# Patient Record
Sex: Female | Born: 1955 | Race: White | Smoking: Former smoker
Health system: Southern US, Community
[De-identification: ages and names within clinical notes are randomized; demographics above are authoritative.]

## PROBLEM LIST (undated history)

## (undated) DIAGNOSIS — F419 Anxiety disorder, unspecified: Secondary | ICD-10-CM

## (undated) DIAGNOSIS — G894 Chronic pain syndrome: Secondary | ICD-10-CM

## (undated) DIAGNOSIS — I10 Essential (primary) hypertension: Secondary | ICD-10-CM

## (undated) DIAGNOSIS — M719 Bursopathy, unspecified: Secondary | ICD-10-CM

## (undated) DIAGNOSIS — F410 Panic disorder [episodic paroxysmal anxiety] without agoraphobia: Secondary | ICD-10-CM

## (undated) DIAGNOSIS — F329 Major depressive disorder, single episode, unspecified: Secondary | ICD-10-CM

## (undated) DIAGNOSIS — K219 Gastro-esophageal reflux disease without esophagitis: Secondary | ICD-10-CM

## (undated) DIAGNOSIS — F32A Depression, unspecified: Secondary | ICD-10-CM

## (undated) HISTORY — PX: LAPAROSCOPIC HYSTERECTOMY: SHX1926

## (undated) HISTORY — PX: TOE SURGERY: SHX1073

## (undated) HISTORY — PX: TONSILLECTOMY: SUR1361

## (undated) HISTORY — DX: Essential (primary) hypertension: I10

---

## 2007-07-25 ENCOUNTER — Ambulatory Visit: Payer: Self-pay | Admitting: Podiatry

## 2007-08-01 ENCOUNTER — Ambulatory Visit: Payer: Self-pay | Admitting: Podiatry

## 2010-05-06 ENCOUNTER — Ambulatory Visit: Payer: Self-pay | Admitting: Rheumatology

## 2014-02-21 ENCOUNTER — Ambulatory Visit: Payer: Self-pay | Admitting: Family Medicine

## 2014-02-21 LAB — RAPID STREP-A WITH REFLX: MICRO TEXT REPORT: NEGATIVE

## 2014-02-23 LAB — BETA STREP CULTURE(ARMC)

## 2014-11-10 ENCOUNTER — Encounter: Payer: Self-pay | Admitting: Family Medicine

## 2014-11-10 ENCOUNTER — Ambulatory Visit (INDEPENDENT_AMBULATORY_CARE_PROVIDER_SITE_OTHER): Payer: BLUE CROSS/BLUE SHIELD | Admitting: Family Medicine

## 2014-11-10 VITALS — BP 130/96 | HR 68 | Ht 68.0 in | Wt 175.0 lb

## 2014-11-10 DIAGNOSIS — H6503 Acute serous otitis media, bilateral: Secondary | ICD-10-CM

## 2014-11-10 DIAGNOSIS — H6983 Other specified disorders of Eustachian tube, bilateral: Secondary | ICD-10-CM | POA: Diagnosis not present

## 2014-11-10 DIAGNOSIS — J302 Other seasonal allergic rhinitis: Secondary | ICD-10-CM | POA: Diagnosis not present

## 2014-11-10 NOTE — Progress Notes (Signed)
Name: Kaitlyn Ali   MRN: [id number]    DOB: 1955/05/25   Date:11/10/2014       Progress Note  Subjective  Chief Complaint  Chief Complaint  Patient presents with  . Ear Pain     pressure in the R) ear    Otalgia  There is pain in both ears. This is a recurrent problem. The current episode started in the past 7 days. The problem occurs constantly. The problem has been waxing and waning. There has been no fever. The pain is at a severity of 4/10. The pain is mild. Pertinent negatives include no abdominal pain, coughing, diarrhea, ear discharge, headaches, hearing loss, neck pain, rash, rhinorrhea, sore throat or vomiting. She has tried acetaminophen for the symptoms. The treatment provided mild relief. There is no history of hearing loss.    No problem-specific assessment & plan notes found for this encounter.   No past medical history on file.  Past Surgical History  Procedure Laterality Date  . Vaginal hysterectomy    . Toe surgery      shaved bone in R) great toe  . Tonsillectomy      No family history on file.  History   Social History  . Marital Status: Married    Spouse Name: N/A  . Number of Children: N/A  . Years of Education: N/A   Occupational History  . Not on file.   Social History Main Topics  . Smoking status: Former Research scientist (life sciences)  . Smokeless tobacco: Not on file  . Alcohol Use: 0.0 oz/week    0 Standard drinks or equivalent per week  . Drug Use: No  . Sexual Activity: Not on file   Other Topics Concern  . Not on file   Social History Narrative  . No narrative on file    Allergies  Allergen Reactions  . Nsaids     Bothers stomach     Review of Systems  Constitutional: Negative for fever, chills, weight loss and malaise/fatigue.  HENT: Positive for ear pain. Negative for ear discharge, hearing loss, rhinorrhea and sore throat.   Eyes: Negative for blurred vision.  Respiratory: Negative for cough, sputum production, shortness of breath and  wheezing.   Cardiovascular: Negative for chest pain, palpitations and leg swelling.  Gastrointestinal: Negative for heartburn, nausea, vomiting, abdominal pain, diarrhea, constipation, blood in stool and melena.  Genitourinary: Negative for dysuria, urgency, frequency and hematuria.  Musculoskeletal: Negative for myalgias, back pain, joint pain and neck pain.  Skin: Negative for rash.  Neurological: Negative for dizziness, tingling, sensory change, focal weakness and headaches.  Endo/Heme/Allergies: Negative for environmental allergies and polydipsia. Does not bruise/bleed easily.  Psychiatric/Behavioral: Negative for depression and suicidal ideas. The patient is not nervous/anxious and does not have insomnia.      Objective  Filed Vitals:   11/10/14 1535  BP: 130/96  Pulse: 68  Height: 5\' 8"  (1.727 m)  Weight: 175 lb (79.379 kg)    Physical Exam  Constitutional: She is well-developed, well-nourished, and in no distress. No distress.  HENT:  Head: Normocephalic and atraumatic.  Right Ear: External ear normal.  Left Ear: External ear normal.  Nose: Nose normal.  Mouth/Throat: Oropharynx is clear and moist.  Eyes: Conjunctivae and EOM are normal. Pupils are equal, round, and reactive to light. Right eye exhibits no discharge. Left eye exhibits no discharge.  Neck: Normal range of motion. Neck supple. No JVD present. No thyromegaly present.  Cardiovascular: Normal rate, regular rhythm, normal heart  sounds and intact distal pulses.  Exam reveals no gallop and no friction rub.   No murmur heard. Pulmonary/Chest: Effort normal and breath sounds normal.  Abdominal: Soft. Bowel sounds are normal. She exhibits no mass. There is no tenderness. There is no guarding.  Musculoskeletal: Normal range of motion. She exhibits no edema.  Lymphadenopathy:    She has no cervical adenopathy.  Neurological: She is alert. She has normal reflexes.  Skin: Skin is warm and dry. No rash noted. She is  not diaphoretic. No erythema. No pallor.  Psychiatric: Mood and affect normal.  Nursing note and vitals reviewed.     Assessment & Plan  Problem List Items Addressed This Visit    None    Visit Diagnoses    Eustachian tube dysfunction, bilateral    -  Primary    flonase/sudafed benedryl q hs    Bilateral acute serous otitis media, recurrence not specified             Dr. [facility]  11/10/2014

## 2014-11-11 MED ORDER — FLUTICASONE PROPIONATE 50 MCG/ACT NA SUSP
2.0000 | Freq: Every day | NASAL | Status: DC
Start: 2014-11-11 — End: 2015-05-14

## 2014-11-11 NOTE — Addendum Note (Signed)
Addended by: Fredderick Severance on: 11/11/2014 02:11 PM   Modules accepted: Orders

## 2014-12-14 ENCOUNTER — Ambulatory Visit
Admission: EM | Admit: 2014-12-14 | Discharge: 2014-12-14 | Disposition: A | Discharge status: Home / Self Care | Payer: BLUE CROSS/BLUE SHIELD | Attending: Family Medicine | Admitting: Family Medicine

## 2014-12-14 ENCOUNTER — Encounter: Payer: Self-pay | Admitting: Emergency Medicine

## 2014-12-14 DIAGNOSIS — L02419 Cutaneous abscess of limb, unspecified: Secondary | ICD-10-CM

## 2014-12-14 DIAGNOSIS — L03317 Cellulitis of buttock: Secondary | ICD-10-CM | POA: Diagnosis not present

## 2014-12-14 DIAGNOSIS — L03119 Cellulitis of unspecified part of limb: Secondary | ICD-10-CM

## 2014-12-14 HISTORY — DX: Depression, unspecified: F32.A

## 2014-12-14 HISTORY — DX: Major depressive disorder, single episode, unspecified: F32.9

## 2014-12-14 HISTORY — DX: Panic disorder (episodic paroxysmal anxiety): F41.0

## 2014-12-14 HISTORY — DX: Anxiety disorder, unspecified: F41.9

## 2014-12-14 HISTORY — DX: Bursopathy, unspecified: M71.9

## 2014-12-14 MED ORDER — MUPIROCIN 2 % EX OINT: 1.0000 "application " | TOPICAL_OINTMENT | Freq: Two times a day (BID) | CUTANEOUS | Status: DC

## 2014-12-14 MED ORDER — SULFAMETHOXAZOLE-TRIMETHOPRIM 800-160 MG PO TABS: 1.0000 | ORAL_TABLET | Freq: Two times a day (BID) | ORAL | Status: AC

## 2014-12-14 MED ORDER — CEFTRIAXONE SODIUM 1 G IJ SOLR
1.0000 g | Freq: Once | INTRAMUSCULAR | Status: AC
  Administered 2014-12-14: 1 g via INTRAMUSCULAR

## 2014-12-14 NOTE — ED Notes (Signed)
Patient presents here with c/o abscess to the lt buttocks asscoited with fever, states that it started as a small pustule and got worse, redness and swelling noted over the site

## 2014-12-14 NOTE — ED Provider Notes (Signed)
CSN: [id number]     Arrival date & time 12/14/14  1351 History   First MD Initiated Contact with Patient 12/14/14 1459     Chief Complaint  Patient presents with  . 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  Denied drainage.  Has been applying hot backs.  Has not had anything like this before.  Denied history of MRSA.  Voiding and stooling without difficulty.  Denied nausea/vomiting/diarrhea.  The history is provided by the patient.    Past Medical History  Diagnosis Date  . Anxiety   . Depression   . Panic attack   . Bursitis    Past Surgical History  Procedure Laterality Date  . Vaginal hysterectomy    . Toe surgery      shaved bone in R) great toe  . Tonsillectomy     No family history on file. Social History  Substance Use Topics  . Smoking status: Former Research scientist (life sciences)  . Smokeless tobacco: None  . Alcohol Use: 0.0 oz/week    0 Standard drinks or equivalent per week   OB History    No data available     Review of Systems  Constitutional: Positive for fever. Negative for chills, diaphoresis, activity change, appetite change and fatigue.  HENT: Negative for congestion, dental problem, drooling, ear discharge, ear pain, facial swelling, hearing loss, mouth sores, nosebleeds, postnasal drip, rhinorrhea, sinus pressure, sneezing, sore throat, tinnitus, trouble swallowing and voice change.   Eyes: Negative for photophobia, pain, discharge, redness, itching and visual disturbance.  Respiratory: Negative for cough, choking, chest tightness, shortness of breath, wheezing and stridor.   Cardiovascular: Negative for chest pain, palpitations and leg swelling.  Gastrointestinal:  Negative for nausea, vomiting, abdominal pain, diarrhea, constipation, blood in stool and abdominal distention.  Endocrine: Negative for cold intolerance and heat intolerance.  Genitourinary: Negative for dysuria.  Musculoskeletal: Positive for myalgias. Negative for back pain, joint swelling, arthralgias, gait problem, neck pain and neck stiffness.  Skin: Positive for color change and rash. Negative for pallor and wound.  Allergic/Immunologic: Negative for environmental allergies and food allergies.  Neurological: Negative for dizziness, tremors, seizures, syncope, facial asymmetry, speech difficulty, weakness, light-headedness, numbness and headaches.  Hematological: Negative for adenopathy. Does not bruise/bleed easily.  Psychiatric/Behavioral: Positive for sleep disturbance. Negative for behavioral problems, confusion and agitation. The patient is nervous/anxious.     Allergies  Nsaids  Home Medications   Prior to Admission medications   Medication Sig Start Date End Date Taking? Authorizing Provider  ALPRAZolam (XANAX XR) 1 MG 24 hr tablet Take 1 mg by mouth 2 (two) times daily. Dr Bridgett Larsson 10/17/14  Yes Historical Provider, MD  buPROPion (WELLBUTRIN XL) 300 MG 24 hr tablet Take 300 mg by mouth every morning. Dr Bridgett Larsson 09/17/14  Yes Historical Provider, MD  fluticasone (FLONASE) 50 MCG/ACT nasal spray Place 2 sprays into both nostrils daily. 11/11/14  Yes Juline Patch, MD  oxyCODONE-acetaminophen (PERCOCET) 10-325 MG per tablet Take 1 tablet by mouth every 8 (eight) hours as needed. Dr Carrolyn Meiers 10/17/14  Yes Historical Provider, MD  mupirocin ointment (BACTROBAN) 2 % Apply 1 application topically 2 (two) times daily. 12/14/14   Olen Cordial, NP  sulfamethoxazole-trimethoprim (BACTRIM DS,SEPTRA DS) 800-160 MG per tablet Take 1 tablet by mouth 2 (two)  times daily. 12/14/14 12/21/14  Olen Cordial, NP   Meds Ordered and Administered this Visit   Medications  cefTRIAXone (ROCEPHIN) injection 1  g (1 g Intramuscular Given 12/14/14 1534)    BP 117/69 mmHg  Pulse 82  Temp(Src) 98.5 F (36.9 C) (Tympanic)  Resp 20  Ht 5\' 8"  (1.727 m)  Wt 186 lb (84.369 kg)  BMI 28.29 kg/m2  SpO2 98% No data found.   Physical Exam  Constitutional: She is oriented to person, place, and time. Vital signs are normal. She appears well-developed and well-nourished. No distress.  HENT:  Head: Normocephalic and atraumatic.  Right Ear: External ear normal.  Left Ear: External ear normal.  Nose: Nose normal.  Mouth/Throat: Oropharynx is clear and moist. No oropharyngeal exudate.  Eyes: Conjunctivae, EOM and lids are normal. Pupils are equal, round, and reactive to light. Right eye exhibits no discharge. Left eye exhibits no discharge. No scleral icterus.  Neck: Trachea normal and normal range of motion. Neck supple. No tracheal deviation present.  Cardiovascular: Normal rate, regular rhythm, normal heart sounds and intact distal pulses.  Exam reveals no gallop and no friction rub.   No murmur heard. Pulmonary/Chest: Effort normal and breath sounds normal. No stridor. No respiratory distress. She has no wheezes. She has no rales. She exhibits no tenderness.  Abdominal: Soft. Bowel sounds are normal. She exhibits no distension and no mass. There is no tenderness. There is no rebound and no guarding.  Musculoskeletal: Normal range of motion. She exhibits edema and tenderness.       Right hip: Normal.       Left hip: Normal.       Right knee: Normal.       Left knee: Normal.       Lumbar back: Normal.       Right upper leg: Normal.       Left upper leg: She exhibits tenderness, swelling and edema. She exhibits no bony tenderness, no deformity and no laceration.       Right lower leg: Normal.       Left lower leg: Normal.  Lymphadenopathy:    She has no cervical adenopathy.  Neurological: She is alert and oriented to person, place, and time. She exhibits normal muscle tone. Coordination normal.  Skin:  Skin is warm, dry and intact. Rash noted. No abrasion, no bruising, no burn, no ecchymosis, no laceration, no lesion and no petechiae noted. Rash is maculopapular. She is not diaphoretic. There is erythema. No cyanosis. No pallor. Nails show no clubbing.     Psychiatric: She has a normal mood and affect. Her speech is normal and behavior is normal. Judgment and thought content normal. Cognition and memory are normal.  Nursing note and vitals reviewed.   ED Course  Procedures (including critical care time)  Labs Review Labs Reviewed - No data to display  Imaging Review No results found.  Patient lying on exam table for exam with left leg elevated uncomfortable to sit in chair.   Medications  cefTRIAXone (ROCEPHIN) injection 1 g (1 g Intramuscular Given 12/14/14 1534)  given by RN Tressie Stalker; tolerated well VSS s/p injection.  Patient has oxycodone at home for pain and discussed tylenol 500mg  po QID if only taking 1 oxycodone q8h. Try to keep pressure off area.  Hot/cold packs to try and get infection to head and for pain control.  Patient verbalized understanding of information/instructions, agreed with plan of care and had no further questions at this  time.  Filed Vitals:   12/14/14 1602  BP: 117/69  Pulse:   Temp:   Resp:     MDM   1. Cellulitis of buttock   2. Cellulitis and abscess of lower extremity    Rocephin IM in clinic and patient to start bactrim DS po BID as soon as she can start from pharmacy.  Warm compresses and ice.  Follow up with PCM for recheck in 48 hours.  Sooner if worsening in erythema as may need IV antibiotics.  Dr Alveta Heimlich evaluated patient also no fluctuant area to be able to I&D at this time.  Apply bactroban to papule area on left buttock.  Do not scratch or pick skin.  Work excuse x 48 hours.  Will treat for cellulitis and abscess.  Exitcare handout on skin infection and abscess given to patient.  RTC if worsening erythema, pain, purulent discharge, fever.   ER if redness extends above waistline or below thigh level, unable to void every 8 hours.    Wash towels, washcloths, sheets in hot water with bleach every couple of days until infection resolved.  Patient verbalized understanding, agreed with plan of care and had no further questions at this time.      Olen Cordial, NP 12/15/14 2140

## 2014-12-14 NOTE — Discharge Instructions (Signed)

## 2015-01-07 ENCOUNTER — Ambulatory Visit
Admission: EM | Admit: 2015-01-07 | Discharge: 2015-01-07 | Disposition: A | Discharge status: Home / Self Care | Payer: BLUE CROSS/BLUE SHIELD | Attending: Family Medicine | Admitting: Family Medicine

## 2015-01-07 DIAGNOSIS — Z139 Encounter for screening, unspecified: Secondary | ICD-10-CM | POA: Diagnosis not present

## 2015-01-07 DIAGNOSIS — I499 Cardiac arrhythmia, unspecified: Secondary | ICD-10-CM | POA: Insufficient documentation
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
## 2015-01-07 NOTE — ED Notes (Signed)
Pt state "I was told my my pain management doctor that I needed an EKG as the test I had done in the office showed I am at risk for sudden death. I am confused because I feel fine. Dr. Ronnald Ramp is unable to see me today."

## 2015-05-12 ENCOUNTER — Other Ambulatory Visit: Payer: Self-pay

## 2015-05-14 ENCOUNTER — Encounter: Payer: Self-pay | Admitting: Family Medicine

## 2015-05-14 ENCOUNTER — Ambulatory Visit (INDEPENDENT_AMBULATORY_CARE_PROVIDER_SITE_OTHER): Payer: BLUE CROSS/BLUE SHIELD | Admitting: Family Medicine

## 2015-05-14 VITALS — BP 138/80 | HR 80 | Ht 68.0 in | Wt 193.0 lb

## 2015-05-14 DIAGNOSIS — F329 Major depressive disorder, single episode, unspecified: Secondary | ICD-10-CM | POA: Diagnosis not present

## 2015-05-14 DIAGNOSIS — J302 Other seasonal allergic rhinitis: Secondary | ICD-10-CM

## 2015-05-14 DIAGNOSIS — J301 Allergic rhinitis due to pollen: Secondary | ICD-10-CM | POA: Diagnosis not present

## 2015-05-14 DIAGNOSIS — F419 Anxiety disorder, unspecified: Secondary | ICD-10-CM

## 2015-05-14 DIAGNOSIS — F32A Depression, unspecified: Secondary | ICD-10-CM

## 2015-05-14 MED ORDER — FLUTICASONE PROPIONATE 50 MCG/ACT NA SUSP: 2.0000 | Freq: Every day | NASAL | Status: DC

## 2015-05-14 MED ORDER — ALPRAZOLAM ER 1 MG PO TB24: 1.0000 mg | ORAL_TABLET | Freq: Two times a day (BID) | ORAL | Status: DC

## 2015-05-14 MED ORDER — BUPROPION HCL ER (XL) 300 MG PO TB24: 300.0000 mg | ORAL_TABLET | Freq: Every morning | ORAL | Status: DC

## 2015-05-14 NOTE — Progress Notes (Signed)
Name: Kaitlyn Ali   MRN: [id number]    DOB: March 31, 1956   Date:05/14/2015       Progress Note  Subjective  Chief Complaint  Chief Complaint  Patient presents with  . Anxiety    needs refill on Xanax until getting in with Psych    Anxiety Presents for follow-up visit. The problem has been waxing and waning. Symptoms include excessive worry and nervous/anxious behavior. Patient reports no chest pain, compulsions, confusion, decreased concentration, depressed mood, dizziness, dry mouth, feeling of choking, hyperventilation, impotence, insomnia, irritability, malaise, muscle tension, nausea, obsessions, palpitations, panic, restlessness, shortness of breath or suicidal ideas. Symptoms occur most days. The severity of symptoms is mild. The symptoms are aggravated by work stress. The quality of sleep is good.   Her past medical history is significant for anxiety/panic attacks. There is no history of anemia, arrhythmia, asthma, bipolar disorder, CAD, CHF, chronic lung disease, depression, hyperthyroidism or suicide attempts. Past treatments include benzodiazephines. The treatment provided mild relief. Compliance with prior treatments has been good. Compliance with medications is 76-100%.    No problem-specific assessment & plan notes found for this encounter.   Past Medical History  Diagnosis Date  . Anxiety   . Depression   . Panic attack   . Bursitis     Past Surgical History  Procedure Laterality Date  . Vaginal hysterectomy    . Toe surgery      shaved bone in R) great toe  . Tonsillectomy      Family History  Problem Relation Age of Onset  . Diabetes Mother   . Cancer Father   . Cancer Sister     Social History   Social History  . Marital Status: Married    Spouse Name: N/A  . Number of Children: N/A  . Years of Education: N/A   Occupational History  . Not on file.   Social History Main Topics  . Smoking status: Former Research scientist (life sciences)  . Smokeless tobacco: Not on  file  . Alcohol Use: 0.0 oz/week    0 Standard drinks or equivalent per week  . Drug Use: No  . Sexual Activity: Yes   Other Topics Concern  . Not on file   Social History Narrative    Allergies  Allergen Reactions  . Nsaids     Bothers stomach     Review of Systems  Constitutional: Negative for fever, chills, weight loss, malaise/fatigue and irritability.  HENT: Negative for ear discharge, ear pain and sore throat.   Eyes: Negative for blurred vision.  Respiratory: Negative for cough, sputum production, shortness of breath and wheezing.   Cardiovascular: Negative for chest pain, palpitations and leg swelling.  Gastrointestinal: Negative for heartburn, nausea, abdominal pain, diarrhea, constipation, blood in stool and melena.  Genitourinary: Negative for dysuria, urgency, frequency, hematuria and impotence.  Musculoskeletal: Negative for myalgias, back pain, joint pain and neck pain.  Skin: Negative for rash.  Neurological: Negative for dizziness, tingling, sensory change, focal weakness and headaches.  Endo/Heme/Allergies: Negative for environmental allergies and polydipsia. Does not bruise/bleed easily.  Psychiatric/Behavioral: Negative for depression, suicidal ideas, confusion and decreased concentration. The patient is nervous/anxious. The patient does not have insomnia.      Objective  Filed Vitals:   05/14/15 0845  BP: 138/80  Pulse: 80  Height: 5\' 8"  (1.727 m)  Weight: 193 lb (87.544 kg)    Physical Exam  Constitutional: She is well-developed, well-nourished, and in no distress. No distress.  HENT:  Head: Normocephalic  and atraumatic.  Right Ear: External ear normal.  Left Ear: External ear normal.  Nose: Nose normal.  Mouth/Throat: Oropharynx is clear and moist.  Eyes: Conjunctivae and EOM are normal. Pupils are equal, round, and reactive to light. Right eye exhibits no discharge. Left eye exhibits no discharge.  Neck: Normal range of motion. Neck supple.  No JVD present. No thyromegaly present.  Cardiovascular: Normal rate, regular rhythm, normal heart sounds and intact distal pulses.  Exam reveals no gallop and no friction rub.   No murmur heard. Pulmonary/Chest: Effort normal and breath sounds normal.  Abdominal: Soft. Bowel sounds are normal. She exhibits no mass. There is no tenderness. There is no guarding.  Musculoskeletal: Normal range of motion. She exhibits no edema.  Lymphadenopathy:    She has no cervical adenopathy.  Neurological: She is alert.  Skin: Skin is warm and dry. She is not diaphoretic.  Psychiatric: Mood and affect normal.      Assessment & Plan  Problem List Items Addressed This Visit    None    Visit Diagnoses    Chronic anxiety    -  Primary    Relevant Medications    ALPRAZolam (XANAX XR) 1 MG 24 hr tablet    buPROPion (WELLBUTRIN XL) 300 MG 24 hr tablet    Depression        pt making psych appt    Relevant Medications    ALPRAZolam (XANAX XR) 1 MG 24 hr tablet    buPROPion (WELLBUTRIN XL) 300 MG 24 hr tablet    Allergic rhinitis due to pollen        Relevant Medications    fluticasone (FLONASE) 50 MCG/ACT nasal spray    Other seasonal allergic rhinitis        Relevant Medications    fluticasone (FLONASE) 50 MCG/ACT nasal spray         Dr. Deanna [facility]  05/14/2015

## 2015-06-15 ENCOUNTER — Ambulatory Visit (INDEPENDENT_AMBULATORY_CARE_PROVIDER_SITE_OTHER): Payer: BLUE CROSS/BLUE SHIELD | Admitting: Family Medicine

## 2015-06-15 ENCOUNTER — Encounter: Payer: Self-pay | Admitting: Family Medicine

## 2015-06-15 VITALS — BP 120/98 | HR 80 | Temp 98.0°F | Ht 68.0 in | Wt 185.0 lb

## 2015-06-15 DIAGNOSIS — J0100 Acute maxillary sinusitis, unspecified: Secondary | ICD-10-CM

## 2015-06-15 MED ORDER — AMOXICILLIN 500 MG PO CAPS: 500.0000 mg | ORAL_CAPSULE | Freq: Three times a day (TID) | ORAL | Status: DC

## 2015-06-15 NOTE — Progress Notes (Signed)
Name: Kaitlyn Ali   MRN: [id number]    DOB: 06-Dec-1955   Date:06/15/2015       Progress Note  Subjective  Chief Complaint  Chief Complaint  Patient presents with  . Sore Throat    runny nose, teeth pain, headache, fever/ sweating    Sore Throat  This is a new problem. The current episode started in the past 7 days. The problem has been waxing and waning. Maximum temperature: low grade fever. The pain is at a severity of 3/10. The pain is moderate. Associated symptoms include congestion, coughing, diarrhea and headaches. Pertinent negatives include no abdominal pain, drooling, ear discharge, ear pain, hoarse voice, plugged ear sensation, neck pain, shortness of breath, swollen glands or vomiting. Associated symptoms comments: nausea. Kaitlyn Ali has tried acetaminophen and NSAIDs for the symptoms. The treatment provided mild relief.    No problem-specific assessment & plan notes found for this encounter.   Past Medical History  Diagnosis Date  . Anxiety   . Depression   . Panic attack   . Bursitis     Past Surgical History  Procedure Laterality Date  . Vaginal hysterectomy    . Toe surgery      shaved bone in R) great toe  . Tonsillectomy      Family History  Problem Relation Age of Onset  . Diabetes Mother   . Cancer Father   . Cancer Sister     Social History   Social History  . Marital Status: Married    Spouse Name: N/A  . Number of Children: N/A  . Years of Education: N/A   Occupational History  . Not on file.   Social History Main Topics  . Smoking status: Former Research scientist (life sciences)  . Smokeless tobacco: Not on file  . Alcohol Use: 0.0 oz/week    0 Standard drinks or equivalent per week  . Drug Use: No  . Sexual Activity: Yes   Other Topics Concern  . Not on file   Social History Narrative    Allergies  Allergen Reactions  . Nsaids     Bothers stomach     Review of Systems  Constitutional: Negative for fever, chills, weight loss and malaise/fatigue.   HENT: Positive for congestion. Negative for drooling, ear discharge, ear pain, hoarse voice and sore throat.   Eyes: Negative for blurred vision.  Respiratory: Positive for cough. Negative for sputum production, shortness of breath and wheezing.   Cardiovascular: Negative for chest pain, palpitations and leg swelling.  Gastrointestinal: Positive for diarrhea. Negative for heartburn, nausea, vomiting, abdominal pain, constipation, blood in stool and melena.  Genitourinary: Negative for dysuria, urgency, frequency and hematuria.  Musculoskeletal: Negative for myalgias, back pain, joint pain and neck pain.  Skin: Negative for rash.  Neurological: Positive for headaches. Negative for dizziness, tingling, sensory change and focal weakness.  Endo/Heme/Allergies: Negative for environmental allergies and polydipsia. Does not bruise/bleed easily.  Psychiatric/Behavioral: Negative for depression and suicidal ideas. The patient is not nervous/anxious and does not have insomnia.      Objective  Filed Vitals:   06/15/15 1503  BP: 120/98  Pulse: 80  Temp: 98 F (36.7 C)  TempSrc: Oral  Height: 5\' 8"  (1.727 m)  Weight: 185 lb (83.915 kg)    Physical Exam  Constitutional: Kaitlyn Ali is well-developed, well-nourished, and in no distress. No distress.  HENT:  Head: Normocephalic and atraumatic.  Right Ear: External ear normal.  Left Ear: External ear normal.  Nose: Nose normal.  Mouth/Throat: Oropharynx  is clear and moist.  Eyes: Conjunctivae and EOM are normal. Pupils are equal, round, and reactive to light. Right eye exhibits no discharge. Left eye exhibits no discharge.  Neck: Normal range of motion. Neck supple. No JVD present. No thyromegaly present.  Cardiovascular: Normal rate, regular rhythm, normal heart sounds and intact distal pulses.  Exam reveals no gallop and no friction rub.   No murmur heard. Pulmonary/Chest: Effort normal and breath sounds normal.  Abdominal: Soft. Bowel sounds are  normal. Kaitlyn Ali exhibits no mass. There is no tenderness. There is no guarding.  Musculoskeletal: Normal range of motion. Kaitlyn Ali exhibits no edema.  Lymphadenopathy:    Kaitlyn Ali has no cervical adenopathy.  Neurological: Kaitlyn Ali is alert. Kaitlyn Ali has normal reflexes.  Skin: Skin is warm and dry. Kaitlyn Ali is not diaphoretic.  Psychiatric: Mood and affect normal.  Nursing note and vitals reviewed.     Assessment & Plan  Problem List Items Addressed This Visit    None    Visit Diagnoses    Acute maxillary sinusitis, recurrence not specified    -  Primary    Relevant Medications    amoxicillin (AMOXIL) 500 MG capsule         Dr. Cervando Durnin [facility]  06/15/2015

## 2015-07-08 ENCOUNTER — Other Ambulatory Visit: Payer: Self-pay

## 2015-07-09 ENCOUNTER — Other Ambulatory Visit: Payer: Self-pay

## 2015-07-09 DIAGNOSIS — J0100 Acute maxillary sinusitis, unspecified: Secondary | ICD-10-CM

## 2015-07-09 MED ORDER — AMOXICILLIN 500 MG PO CAPS: 500.0000 mg | ORAL_CAPSULE | Freq: Three times a day (TID) | ORAL | Status: DC

## 2015-07-22 DIAGNOSIS — F411 Generalized anxiety disorder: Secondary | ICD-10-CM | POA: Diagnosis not present

## 2015-07-22 DIAGNOSIS — F330 Major depressive disorder, recurrent, mild: Secondary | ICD-10-CM | POA: Diagnosis not present

## 2015-07-30 DIAGNOSIS — M5431 Sciatica, right side: Secondary | ICD-10-CM | POA: Diagnosis not present

## 2015-07-30 DIAGNOSIS — Z79891 Long term (current) use of opiate analgesic: Secondary | ICD-10-CM | POA: Diagnosis not present

## 2015-07-30 DIAGNOSIS — M5441 Lumbago with sciatica, right side: Secondary | ICD-10-CM | POA: Diagnosis not present

## 2015-07-30 DIAGNOSIS — G8929 Other chronic pain: Secondary | ICD-10-CM | POA: Diagnosis not present

## 2015-07-30 DIAGNOSIS — M545 Low back pain: Secondary | ICD-10-CM | POA: Diagnosis not present

## 2015-09-01 DIAGNOSIS — Z79891 Long term (current) use of opiate analgesic: Secondary | ICD-10-CM | POA: Diagnosis not present

## 2015-09-01 DIAGNOSIS — M25551 Pain in right hip: Secondary | ICD-10-CM | POA: Diagnosis not present

## 2015-09-01 DIAGNOSIS — M25511 Pain in right shoulder: Secondary | ICD-10-CM | POA: Diagnosis not present

## 2015-09-01 DIAGNOSIS — M25552 Pain in left hip: Secondary | ICD-10-CM | POA: Diagnosis not present

## 2015-09-01 DIAGNOSIS — G894 Chronic pain syndrome: Secondary | ICD-10-CM | POA: Diagnosis not present

## 2015-10-01 DIAGNOSIS — G8929 Other chronic pain: Secondary | ICD-10-CM | POA: Diagnosis not present

## 2015-10-01 DIAGNOSIS — Z79891 Long term (current) use of opiate analgesic: Secondary | ICD-10-CM | POA: Diagnosis not present

## 2015-10-01 DIAGNOSIS — G894 Chronic pain syndrome: Secondary | ICD-10-CM | POA: Diagnosis not present

## 2015-10-01 DIAGNOSIS — G541 Lumbosacral plexus disorders: Secondary | ICD-10-CM | POA: Diagnosis not present

## 2015-10-01 DIAGNOSIS — G603 Idiopathic progressive neuropathy: Secondary | ICD-10-CM | POA: Diagnosis not present

## 2015-10-20 DIAGNOSIS — F330 Major depressive disorder, recurrent, mild: Secondary | ICD-10-CM | POA: Diagnosis not present

## 2015-10-28 DIAGNOSIS — M25511 Pain in right shoulder: Secondary | ICD-10-CM | POA: Diagnosis not present

## 2015-10-28 DIAGNOSIS — M5416 Radiculopathy, lumbar region: Secondary | ICD-10-CM | POA: Diagnosis not present

## 2015-10-28 DIAGNOSIS — M25552 Pain in left hip: Secondary | ICD-10-CM | POA: Diagnosis not present

## 2015-10-28 DIAGNOSIS — M25551 Pain in right hip: Secondary | ICD-10-CM | POA: Diagnosis not present

## 2015-10-28 DIAGNOSIS — G894 Chronic pain syndrome: Secondary | ICD-10-CM | POA: Diagnosis not present

## 2015-10-28 DIAGNOSIS — M25571 Pain in right ankle and joints of right foot: Secondary | ICD-10-CM | POA: Diagnosis not present

## 2015-10-28 DIAGNOSIS — M545 Low back pain: Secondary | ICD-10-CM | POA: Diagnosis not present

## 2015-11-25 DIAGNOSIS — M25571 Pain in right ankle and joints of right foot: Secondary | ICD-10-CM | POA: Diagnosis not present

## 2015-11-25 DIAGNOSIS — G894 Chronic pain syndrome: Secondary | ICD-10-CM | POA: Diagnosis not present

## 2015-11-25 DIAGNOSIS — M25562 Pain in left knee: Secondary | ICD-10-CM | POA: Diagnosis not present

## 2015-11-25 DIAGNOSIS — M25511 Pain in right shoulder: Secondary | ICD-10-CM | POA: Diagnosis not present

## 2015-11-25 DIAGNOSIS — Z79891 Long term (current) use of opiate analgesic: Secondary | ICD-10-CM | POA: Diagnosis not present

## 2015-11-26 ENCOUNTER — Encounter: Payer: Self-pay | Admitting: Family Medicine

## 2015-11-26 ENCOUNTER — Ambulatory Visit (INDEPENDENT_AMBULATORY_CARE_PROVIDER_SITE_OTHER): Payer: BLUE CROSS/BLUE SHIELD | Admitting: Family Medicine

## 2015-11-26 VITALS — BP 120/80 | HR 98 | Temp 99.2°F | Ht 68.0 in | Wt 182.0 lb

## 2015-11-26 DIAGNOSIS — J0100 Acute maxillary sinusitis, unspecified: Secondary | ICD-10-CM | POA: Diagnosis not present

## 2015-11-26 MED ORDER — DOXYCYCLINE HYCLATE 100 MG PO TABS: 100.0000 mg | ORAL_TABLET | Freq: Two times a day (BID) | ORAL | 0 refills | Status: DC

## 2015-11-26 NOTE — Progress Notes (Signed)
Name: Kaitlyn Ali   MRN: [id number]    DOB: Dec 26, 1955   Date:11/26/2015       Progress Note  Subjective  Chief Complaint  Chief Complaint  Patient presents with  . Sinusitis    fever, cong, x 2 days- had traveled from [place] on [place]- started feeling bad on Tuesday    Sinusitis  This is a new problem. The current episode started in the past 7 days. The problem has been gradually worsening since onset. The maximum temperature recorded prior to her arrival was 100.4 - 100.9 F. The fever has been present for 1 to 2 days. The pain is mild. Pertinent negatives include no chills, congestion, coughing, diaphoresis, ear pain, headaches, hoarse voice, neck pain, shortness of breath, sinus pressure, sneezing, sore throat or swollen glands. Past treatments include nothing.    No problem-specific Assessment & Plan notes found for this encounter.   Past Medical History:  Diagnosis Date  . Anxiety   . Bursitis   . Depression   . Panic attack     Past Surgical History:  Procedure Laterality Date  . TOE SURGERY     shaved bone in R) great toe  . TONSILLECTOMY    . VAGINAL HYSTERECTOMY      Family History  Problem Relation Age of Onset  . Diabetes Mother   . Cancer Father   . Cancer Sister     Social History   Social History  . Marital status: Married    Spouse name: N/A  . Number of children: N/A  . Years of education: N/A   Occupational History  . Not on file.   Social History Main Topics  . Smoking status: Former Research scientist (life sciences)  . Smokeless tobacco: Never Used  . Alcohol use 0.0 oz/week  . Drug use: No  . Sexual activity: Yes   Other Topics Concern  . Not on file   Social History Narrative  . No narrative on file    Allergies  Allergen Reactions  . Nsaids     Bothers stomach     Review of Systems  Constitutional: Negative for chills, diaphoresis, fever, malaise/fatigue and weight loss.  HENT: Negative for congestion, ear discharge, ear  pain, hoarse voice, sinus pressure, sneezing and sore throat.   Eyes: Negative for blurred vision.  Respiratory: Negative for cough, sputum production, shortness of breath and wheezing.   Cardiovascular: Negative for chest pain, palpitations and leg swelling.  Gastrointestinal: Negative for abdominal pain, blood in stool, constipation, diarrhea, heartburn, melena and nausea.  Genitourinary: Negative for dysuria, frequency, hematuria and urgency.  Musculoskeletal: Negative for back pain, joint pain, myalgias and neck pain.  Skin: Negative for rash.  Neurological: Negative for dizziness, tingling, sensory change, focal weakness and headaches.  Endo/Heme/Allergies: Negative for environmental allergies and polydipsia. Does not bruise/bleed easily.  Psychiatric/Behavioral: Negative for depression and suicidal ideas. The patient is not nervous/anxious and does not have insomnia.      Objective  Vitals:   11/26/15 1108  BP: 120/80  Pulse: 98  Temp: 99.2 F (37.3 C)  TempSrc: Oral  Weight: 182 lb (82.6 kg)  Height: 5\' 8"  (1.727 m)    Physical Exam  Constitutional: She is well-developed, well-nourished, and in no distress. No distress.  HENT:  Head: Normocephalic and atraumatic.  Right Ear: External ear normal.  Left Ear: External ear normal.  Nose: Nose normal.  Mouth/Throat: Oropharynx is clear and moist.  Eyes: Conjunctivae and EOM are normal. Pupils are  equal, round, and reactive to light. Right eye exhibits no discharge. Left eye exhibits no discharge.  Neck: Normal range of motion. Neck supple. No JVD present. No thyromegaly present.  Cardiovascular: Normal rate, regular rhythm, normal heart sounds and intact distal pulses.  Exam reveals no gallop and no friction rub.   No murmur heard. Pulmonary/Chest: Effort normal and breath sounds normal.  Abdominal: Soft. Bowel sounds are normal. She exhibits no mass. There is no tenderness. There is no guarding.  Musculoskeletal: Normal  range of motion. She exhibits no edema.  Lymphadenopathy:    She has no cervical adenopathy.  Neurological: She is alert. She has normal reflexes.  Skin: Skin is warm and dry. She is not diaphoretic.  Psychiatric: Mood and affect normal.  Nursing note and vitals reviewed.     Assessment & Plan  Problem List Items Addressed This Visit    None    Visit Diagnoses    Acute maxillary sinusitis, recurrence not specified    -  Primary   Relevant Medications   doxycycline (VIBRA-TABS) 100 MG tablet        Dr. [facility]  11/26/15

## 2015-12-01 ENCOUNTER — Other Ambulatory Visit: Payer: Self-pay

## 2015-12-01 DIAGNOSIS — J0100 Acute maxillary sinusitis, unspecified: Secondary | ICD-10-CM

## 2015-12-01 MED ORDER — AZITHROMYCIN 250 MG PO TABS: ORAL_TABLET | ORAL | 0 refills | Status: DC

## 2015-12-22 DIAGNOSIS — Z79891 Long term (current) use of opiate analgesic: Secondary | ICD-10-CM | POA: Diagnosis not present

## 2015-12-22 DIAGNOSIS — M25571 Pain in right ankle and joints of right foot: Secondary | ICD-10-CM | POA: Diagnosis not present

## 2015-12-22 DIAGNOSIS — G894 Chronic pain syndrome: Secondary | ICD-10-CM | POA: Diagnosis not present

## 2015-12-22 DIAGNOSIS — M25511 Pain in right shoulder: Secondary | ICD-10-CM | POA: Diagnosis not present

## 2015-12-22 DIAGNOSIS — M25562 Pain in left knee: Secondary | ICD-10-CM | POA: Diagnosis not present

## 2016-01-07 DIAGNOSIS — F330 Major depressive disorder, recurrent, mild: Secondary | ICD-10-CM | POA: Diagnosis not present

## 2016-01-19 DIAGNOSIS — G894 Chronic pain syndrome: Secondary | ICD-10-CM | POA: Diagnosis not present

## 2016-01-19 DIAGNOSIS — M25511 Pain in right shoulder: Secondary | ICD-10-CM | POA: Diagnosis not present

## 2016-01-19 DIAGNOSIS — Z79891 Long term (current) use of opiate analgesic: Secondary | ICD-10-CM | POA: Diagnosis not present

## 2016-01-19 DIAGNOSIS — M25562 Pain in left knee: Secondary | ICD-10-CM | POA: Diagnosis not present

## 2016-01-19 DIAGNOSIS — M25552 Pain in left hip: Secondary | ICD-10-CM | POA: Diagnosis not present

## 2016-02-05 ENCOUNTER — Other Ambulatory Visit: Payer: Self-pay

## 2016-02-05 ENCOUNTER — Ambulatory Visit: Payer: BLUE CROSS/BLUE SHIELD | Admitting: Family Medicine

## 2016-02-17 DIAGNOSIS — M25551 Pain in right hip: Secondary | ICD-10-CM | POA: Diagnosis not present

## 2016-02-17 DIAGNOSIS — M5416 Radiculopathy, lumbar region: Secondary | ICD-10-CM | POA: Diagnosis not present

## 2016-02-17 DIAGNOSIS — M25552 Pain in left hip: Secondary | ICD-10-CM | POA: Diagnosis not present

## 2016-02-17 DIAGNOSIS — G603 Idiopathic progressive neuropathy: Secondary | ICD-10-CM | POA: Diagnosis not present

## 2016-02-17 DIAGNOSIS — G541 Lumbosacral plexus disorders: Secondary | ICD-10-CM | POA: Diagnosis not present

## 2016-02-17 DIAGNOSIS — G8929 Other chronic pain: Secondary | ICD-10-CM | POA: Diagnosis not present

## 2016-02-17 DIAGNOSIS — G894 Chronic pain syndrome: Secondary | ICD-10-CM | POA: Diagnosis not present

## 2016-02-23 DIAGNOSIS — F330 Major depressive disorder, recurrent, mild: Secondary | ICD-10-CM | POA: Diagnosis not present

## 2016-03-16 DIAGNOSIS — M25562 Pain in left knee: Secondary | ICD-10-CM | POA: Diagnosis not present

## 2016-03-16 DIAGNOSIS — Z79891 Long term (current) use of opiate analgesic: Secondary | ICD-10-CM | POA: Diagnosis not present

## 2016-03-16 DIAGNOSIS — G894 Chronic pain syndrome: Secondary | ICD-10-CM | POA: Diagnosis not present

## 2016-03-16 DIAGNOSIS — M25511 Pain in right shoulder: Secondary | ICD-10-CM | POA: Diagnosis not present

## 2016-03-16 DIAGNOSIS — M25571 Pain in right ankle and joints of right foot: Secondary | ICD-10-CM | POA: Diagnosis not present

## 2016-04-05 DIAGNOSIS — F330 Major depressive disorder, recurrent, mild: Secondary | ICD-10-CM | POA: Diagnosis not present

## 2016-04-14 DIAGNOSIS — G894 Chronic pain syndrome: Secondary | ICD-10-CM | POA: Diagnosis not present

## 2016-04-14 DIAGNOSIS — Z79891 Long term (current) use of opiate analgesic: Secondary | ICD-10-CM | POA: Diagnosis not present

## 2016-04-14 DIAGNOSIS — M25562 Pain in left knee: Secondary | ICD-10-CM | POA: Diagnosis not present

## 2016-04-14 DIAGNOSIS — M25511 Pain in right shoulder: Secondary | ICD-10-CM | POA: Diagnosis not present

## 2016-04-14 DIAGNOSIS — M25571 Pain in right ankle and joints of right foot: Secondary | ICD-10-CM | POA: Diagnosis not present

## 2016-05-18 DIAGNOSIS — M25562 Pain in left knee: Secondary | ICD-10-CM | POA: Diagnosis not present

## 2016-05-18 DIAGNOSIS — Z79891 Long term (current) use of opiate analgesic: Secondary | ICD-10-CM | POA: Diagnosis not present

## 2016-05-18 DIAGNOSIS — M25511 Pain in right shoulder: Secondary | ICD-10-CM | POA: Diagnosis not present

## 2016-05-18 DIAGNOSIS — M25552 Pain in left hip: Secondary | ICD-10-CM | POA: Diagnosis not present

## 2016-05-18 DIAGNOSIS — G894 Chronic pain syndrome: Secondary | ICD-10-CM | POA: Diagnosis not present

## 2016-05-21 DIAGNOSIS — F331 Major depressive disorder, recurrent, moderate: Secondary | ICD-10-CM | POA: Diagnosis not present

## 2016-05-21 DIAGNOSIS — F411 Generalized anxiety disorder: Secondary | ICD-10-CM | POA: Diagnosis not present

## 2016-06-10 DIAGNOSIS — S0502XA Injury of conjunctiva and corneal abrasion without foreign body, left eye, initial encounter: Secondary | ICD-10-CM | POA: Diagnosis not present

## 2016-06-13 DIAGNOSIS — H16202 Unspecified keratoconjunctivitis, left eye: Secondary | ICD-10-CM | POA: Diagnosis not present

## 2016-06-14 DIAGNOSIS — M25562 Pain in left knee: Secondary | ICD-10-CM | POA: Diagnosis not present

## 2016-06-14 DIAGNOSIS — Z79891 Long term (current) use of opiate analgesic: Secondary | ICD-10-CM | POA: Diagnosis not present

## 2016-06-14 DIAGNOSIS — M25511 Pain in right shoulder: Secondary | ICD-10-CM | POA: Diagnosis not present

## 2016-06-14 DIAGNOSIS — G894 Chronic pain syndrome: Secondary | ICD-10-CM | POA: Diagnosis not present

## 2016-06-14 DIAGNOSIS — M25571 Pain in right ankle and joints of right foot: Secondary | ICD-10-CM | POA: Diagnosis not present

## 2016-06-20 DIAGNOSIS — H0013 Chalazion right eye, unspecified eyelid: Secondary | ICD-10-CM | POA: Diagnosis not present

## 2016-06-30 ENCOUNTER — Telehealth: Payer: Self-pay

## 2016-06-30 ENCOUNTER — Ambulatory Visit: Payer: BLUE CROSS/BLUE SHIELD | Admitting: Family Medicine

## 2016-06-30 DIAGNOSIS — R21 Rash and other nonspecific skin eruption: Secondary | ICD-10-CM | POA: Diagnosis not present

## 2016-06-30 DIAGNOSIS — L270 Generalized skin eruption due to drugs and medicaments taken internally: Secondary | ICD-10-CM | POA: Diagnosis not present

## 2016-06-30 DIAGNOSIS — L500 Allergic urticaria: Secondary | ICD-10-CM | POA: Diagnosis not present

## 2016-06-30 NOTE — Telephone Encounter (Signed)
Ok thanks done

## 2016-06-30 NOTE — Telephone Encounter (Signed)
Left message wanting appt with Dermatology but when I called back she hjad already seen one today.

## 2016-07-11 DIAGNOSIS — G894 Chronic pain syndrome: Secondary | ICD-10-CM | POA: Diagnosis not present

## 2016-07-11 DIAGNOSIS — M25571 Pain in right ankle and joints of right foot: Secondary | ICD-10-CM | POA: Diagnosis not present

## 2016-07-11 DIAGNOSIS — M25562 Pain in left knee: Secondary | ICD-10-CM | POA: Diagnosis not present

## 2016-07-11 DIAGNOSIS — M25511 Pain in right shoulder: Secondary | ICD-10-CM | POA: Diagnosis not present

## 2016-08-08 DIAGNOSIS — G894 Chronic pain syndrome: Secondary | ICD-10-CM | POA: Diagnosis not present

## 2016-08-08 DIAGNOSIS — M25571 Pain in right ankle and joints of right foot: Secondary | ICD-10-CM | POA: Diagnosis not present

## 2016-08-08 DIAGNOSIS — M25511 Pain in right shoulder: Secondary | ICD-10-CM | POA: Diagnosis not present

## 2016-08-08 DIAGNOSIS — M25562 Pain in left knee: Secondary | ICD-10-CM | POA: Diagnosis not present

## 2016-09-05 DIAGNOSIS — M25511 Pain in right shoulder: Secondary | ICD-10-CM | POA: Diagnosis not present

## 2016-09-05 DIAGNOSIS — G894 Chronic pain syndrome: Secondary | ICD-10-CM | POA: Diagnosis not present

## 2016-09-05 DIAGNOSIS — M25571 Pain in right ankle and joints of right foot: Secondary | ICD-10-CM | POA: Diagnosis not present

## 2016-09-05 DIAGNOSIS — M25562 Pain in left knee: Secondary | ICD-10-CM | POA: Diagnosis not present

## 2016-09-16 ENCOUNTER — Ambulatory Visit (INDEPENDENT_AMBULATORY_CARE_PROVIDER_SITE_OTHER): Payer: BLUE CROSS/BLUE SHIELD | Admitting: Family Medicine

## 2016-09-16 ENCOUNTER — Encounter: Payer: Self-pay | Admitting: Family Medicine

## 2016-09-16 VITALS — BP 100/62 | HR 88 | Temp 98.5°F | Ht 68.0 in | Wt 188.0 lb

## 2016-09-16 DIAGNOSIS — J029 Acute pharyngitis, unspecified: Secondary | ICD-10-CM

## 2016-09-16 MED ORDER — AMOXICILLIN 500 MG PO CAPS: 500.0000 mg | ORAL_CAPSULE | Freq: Three times a day (TID) | ORAL | 0 refills | Status: DC

## 2016-09-16 NOTE — Progress Notes (Signed)
Name: Kaitlyn Ali   MRN: [id number]    DOB: Aug 16, 1955   Date:09/16/2016       Progress Note  Subjective  Chief Complaint  Chief Complaint  Patient presents with  . Sore Throat    x 1 week- scratchy throat/ grandchild has been sick and is living with her    Sore Throat   This is a new problem. The current episode started in the past 7 days. The problem has been waxing and waning. Neither side of throat is experiencing more pain than the other. There has been no fever ("probably"). The fever has been present for 1 to 2 days. The pain is mild. Associated symptoms include congestion and coughing. Pertinent negatives include no abdominal pain, diarrhea, drooling, ear discharge, ear pain, headaches, hoarse voice, plugged ear sensation, neck pain, shortness of breath, stridor, swollen glands or trouble swallowing. Exposure to: child with ear infection. She has tried acetaminophen for the symptoms. The treatment provided mild relief.    No problem-specific Assessment & Plan notes found for this encounter.   Past Medical History:  Diagnosis Date  . Anxiety   . Bursitis   . Depression   . Panic attack     Past Surgical History:  Procedure Laterality Date  . TOE SURGERY     shaved bone in R) great toe  . TONSILLECTOMY    . VAGINAL HYSTERECTOMY      Family History  Problem Relation Age of Onset  . Diabetes Mother   . Cancer Father   . Cancer Sister     Social History   Social History  . Marital status: Married    Spouse name: N/A  . Number of children: N/A  . Years of education: N/A   Occupational History  . Not on file.   Social History Main Topics  . Smoking status: Former Research scientist (life sciences)  . Smokeless tobacco: Never Used  . Alcohol use 0.0 oz/week  . Drug use: No  . Sexual activity: Yes   Other Topics Concern  . Not on file   Social History Narrative  . No narrative on file    Allergies  Allergen Reactions  . Doxycycline Itching  . Nsaids     Bothers stomach     Outpatient Medications Prior to Visit  Medication Sig Dispense Refill  . buPROPion (WELLBUTRIN XL) 300 MG 24 hr tablet Take 1 tablet (300 mg total) by mouth every morning. Dr Bridgett Larsson (Patient taking differently: Take 150 mg by mouth every morning. Dr [facility]) 30 tablet 0  . [facility] MELATONIN 3 MG TABS Take 1 tablet by mouth at bedtime. otc  1  . oxyCODONE-acetaminophen (PERCOCET) 10-325 MG per tablet Take 1 tablet by mouth every 8 (eight) hours as needed. [facility]  0  . fluticasone (FLONASE) 50 MCG/ACT nasal spray Place 2 sprays into both nostrils daily. (Patient not taking: Reported on 09/16/2016) 16 g 11  . ALPRAZolam (XANAX XR) 1 MG 24 hr tablet Take 1 tablet (1 mg total) by mouth 2 (two) times daily. Dr Bridgett Larsson (Patient taking differently: Take 1 mg by mouth 2 (two) [facility] daily. [facility]) 60 tablet 0  . azithromycin (ZITHROMAX) 250 MG tablet Use as directed 6 tablet 0  . busPIRone (BUSPAR) 10 MG tablet Take 1 tablet by mouth 2 (two) times daily. Dr Tomasita Crumble  1   No facility-administered medications prior to visit.     Review of Systems  Constitutional: Negative for chills, fever, malaise/fatigue and weight loss.  HENT: Positive for congestion. Negative for drooling, ear discharge, ear pain, hoarse voice, sore throat and trouble swallowing.   Eyes: Negative for blurred vision.  Respiratory: Positive for cough. Negative for sputum production, shortness of breath, wheezing and stridor.   Cardiovascular: Negative for chest pain, palpitations and leg swelling.  Gastrointestinal: Negative for abdominal pain, blood in stool, constipation, diarrhea, heartburn, melena and nausea.  Genitourinary: Negative for dysuria, frequency, hematuria and urgency.  Musculoskeletal: Negative for back pain, joint pain, myalgias and neck pain.  Skin: Negative for rash.  Neurological: Negative for dizziness, tingling, sensory change, focal weakness and headaches.  Endo/Heme/Allergies: Negative for  environmental allergies and polydipsia. Does not bruise/bleed easily.  Psychiatric/Behavioral: Negative for depression and suicidal ideas. The patient is not nervous/anxious and does not have insomnia.      Objective  Vitals:   09/16/16 1340  BP: 100/62  Pulse: 88  Temp: 98.5 F (36.9 C)  TempSrc: Oral  Weight: 188 lb (85.3 kg)  Height: 5\' 8"  (1.727 m)    Physical Exam  Constitutional: She is well-developed, well-nourished, and in no distress. No distress.  HENT:  Head: Normocephalic and atraumatic.  Right Ear: External ear normal.  Left Ear: External ear normal.  Nose: Nose normal.  Mouth/Throat: Posterior oropharyngeal erythema present. No oropharyngeal exudate, posterior oropharyngeal edema or tonsillar abscesses.  Eyes: Conjunctivae and EOM are normal. Pupils are equal, round, and reactive to light. Right eye exhibits no discharge. Left eye exhibits no discharge.  Neck: Normal range of motion. Neck supple. No JVD present. No thyromegaly present.  Cardiovascular: Normal rate, regular rhythm, normal heart sounds and intact distal pulses.  Exam reveals no gallop and no friction rub.   No murmur heard. Pulmonary/Chest: Effort normal and breath sounds normal. She has no wheezes. She has no rales.  Abdominal: Soft. Bowel sounds are normal. She exhibits no mass. There is no tenderness. There is no guarding.  Musculoskeletal: Normal range of motion. She exhibits no edema.  Lymphadenopathy:       Head (right side): Submandibular adenopathy present. No submental adenopathy present.       Head (left side): Submandibular adenopathy present. No submental adenopathy present.    She has no cervical adenopathy.  Neurological: She is alert. She has normal reflexes.  Skin: Skin is warm and dry. She is not diaphoretic.  Psychiatric: Mood and affect normal.  Nursing note and vitals reviewed.     Assessment & Plan  Problem List Items Addressed This Visit    None    Visit Diagnoses     Pharyngitis, unspecified etiology    -  Primary   Relevant Medications   amoxicillin (AMOXIL) 500 MG capsule      Meds ordered this encounter  Medications  . DISCONTD: amoxicillin (AMOXIL) 500 MG capsule    Sig: Take 1 capsule (500 mg total) by mouth 3 (three) times daily.    Dispense:  30 capsule    Refill:  0  . amoxicillin (AMOXIL) 500 MG capsule    Sig: Take 1 capsule (500 mg total) by mouth 3 (three) times daily.    Dispense:  30 capsule    Refill:  0      Dr. Otilio Miu [facility]  09/16/16

## 2016-10-07 DIAGNOSIS — G894 Chronic pain syndrome: Secondary | ICD-10-CM | POA: Diagnosis not present

## 2016-10-07 DIAGNOSIS — M25552 Pain in left hip: Secondary | ICD-10-CM | POA: Diagnosis not present

## 2016-10-07 DIAGNOSIS — M5416 Radiculopathy, lumbar region: Secondary | ICD-10-CM | POA: Diagnosis not present

## 2016-10-07 DIAGNOSIS — M25551 Pain in right hip: Secondary | ICD-10-CM | POA: Diagnosis not present

## 2016-10-08 DIAGNOSIS — F411 Generalized anxiety disorder: Secondary | ICD-10-CM | POA: Diagnosis not present

## 2016-10-08 DIAGNOSIS — F3342 Major depressive disorder, recurrent, in full remission: Secondary | ICD-10-CM | POA: Diagnosis not present

## 2016-11-01 DIAGNOSIS — G894 Chronic pain syndrome: Secondary | ICD-10-CM | POA: Diagnosis not present

## 2016-11-01 DIAGNOSIS — G541 Lumbosacral plexus disorders: Secondary | ICD-10-CM | POA: Diagnosis not present

## 2016-11-01 DIAGNOSIS — M5416 Radiculopathy, lumbar region: Secondary | ICD-10-CM | POA: Diagnosis not present

## 2016-11-01 DIAGNOSIS — G603 Idiopathic progressive neuropathy: Secondary | ICD-10-CM | POA: Diagnosis not present

## 2016-12-03 DIAGNOSIS — H0011 Chalazion right upper eyelid: Secondary | ICD-10-CM | POA: Diagnosis not present

## 2016-12-06 DIAGNOSIS — G603 Idiopathic progressive neuropathy: Secondary | ICD-10-CM | POA: Diagnosis not present

## 2016-12-06 DIAGNOSIS — G894 Chronic pain syndrome: Secondary | ICD-10-CM | POA: Diagnosis not present

## 2016-12-06 DIAGNOSIS — M5416 Radiculopathy, lumbar region: Secondary | ICD-10-CM | POA: Diagnosis not present

## 2016-12-06 DIAGNOSIS — G541 Lumbosacral plexus disorders: Secondary | ICD-10-CM | POA: Diagnosis not present

## 2017-01-03 DIAGNOSIS — G894 Chronic pain syndrome: Secondary | ICD-10-CM | POA: Diagnosis not present

## 2017-01-03 DIAGNOSIS — M5416 Radiculopathy, lumbar region: Secondary | ICD-10-CM | POA: Diagnosis not present

## 2017-01-03 DIAGNOSIS — G603 Idiopathic progressive neuropathy: Secondary | ICD-10-CM | POA: Diagnosis not present

## 2017-01-03 DIAGNOSIS — G541 Lumbosacral plexus disorders: Secondary | ICD-10-CM | POA: Diagnosis not present

## 2017-01-31 DIAGNOSIS — F1120 Opioid dependence, uncomplicated: Secondary | ICD-10-CM | POA: Diagnosis not present

## 2017-01-31 DIAGNOSIS — G894 Chronic pain syndrome: Secondary | ICD-10-CM | POA: Diagnosis not present

## 2017-02-16 ENCOUNTER — Encounter: Payer: Self-pay | Admitting: Family Medicine

## 2017-02-16 ENCOUNTER — Ambulatory Visit: Payer: BLUE CROSS/BLUE SHIELD | Admitting: Family Medicine

## 2017-02-16 VITALS — BP 138/70 | HR 64 | Ht 68.0 in | Wt 189.0 lb

## 2017-02-16 DIAGNOSIS — E785 Hyperlipidemia, unspecified: Secondary | ICD-10-CM | POA: Diagnosis not present

## 2017-02-16 DIAGNOSIS — M5417 Radiculopathy, lumbosacral region: Secondary | ICD-10-CM | POA: Diagnosis not present

## 2017-02-16 DIAGNOSIS — E663 Overweight: Secondary | ICD-10-CM

## 2017-02-16 NOTE — Progress Notes (Signed)
Name: Kaitlyn Ali   MRN: [id number]    DOB: 06/14/1955   Date:02/16/2017       Progress Note  Subjective  Chief Complaint  Chief Complaint  Patient presents with  . cholesterol check    "haven't had cholesterol checked in years"  . 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 The patient's pertinent negatives include no altered mental status, clumsiness, focal weakness, loss of balance, memory loss, near-syncope, slurred speech, syncope, visual change or weakness. Primary symptoms comment: right thigh and lower leg/foot/no claudication. This is a new problem. The current episode started 1 to 4 weeks ago (1 week). The neurological problem developed gradually. The problem is unchanged. There was lower extremity and right-sided focality noted. Pertinent negatives include no abdominal pain, auditory change, back pain, bladder incontinence, bowel incontinence, chest pain, dizziness, fever, headaches, nausea, neck pain, palpitations, shortness of breath, vertigo or vomiting. Her past medical history is significant for a CVA. There is no history of a clotting disorder or liver disease.  Hyperlipidemia  This is a chronic problem. The current episode started more than 1 year ago. Condition status: uncertained. Recent lipid tests were reviewed and are high (Dr Davis Gourd). She has no history of chronic renal disease, diabetes, hypothyroidism, liver disease, obesity or nephrotic syndrome. Associated symptoms include a focal sensory loss. Pertinent negatives include no chest pain, focal weakness, myalgias or shortness of breath. She is currently on no antihyperlipidemic treatment.    No problem-specific Assessment & Plan notes found for this encounter.   Past Medical History:  Diagnosis Date  . Anxiety   . Bursitis   . Depression   . Panic attack     Past Surgical History:  Procedure Laterality  Date  . TOE SURGERY     shaved bone in R) great toe  . TONSILLECTOMY    . VAGINAL HYSTERECTOMY      Family History  Problem Relation Age of Onset  . Diabetes Mother   . Cancer Father   . Cancer Sister     Social History   Socioeconomic History  . Marital status: Married    Spouse name: Not on file  . Number of children: Not on file  . Years of education: Not on file  . Highest education level: Not on file  Social Needs  . Financial resource strain: Not on file  . Food insecurity - worry: Not on file  . Food insecurity - inability: Not on file  . Transportation needs - medical: Not on file  . Transportation needs - non-medical: Not on file  Occupational History  . Not on file  Tobacco Use  . Smoking status: Former Research scientist (life sciences)  . Smokeless tobacco: Never Used  Substance and Sexual Activity  . Alcohol use: Yes    Alcohol/week: 0.0 oz  . Drug use: No  . Sexual activity: Yes  Other Topics Concern  . Not on file  Social History Narrative  . Not on file    Allergies  Allergen Reactions  . Doxycycline Itching  . Nsaids     Bothers stomach    Outpatient Medications Prior to Visit  Medication Sig Dispense Refill  . buPROPion (WELLBUTRIN XL) 300 MG 24 hr tablet Take 1 tablet (300 mg total) by mouth every morning. Dr Bridgett Larsson (Patient taking differently: Take 150 mg by mouth every morning. Dr Toy Care) 30 tablet 0  . [facility]  MELATONIN 3 MG TABS Take 1 tablet by mouth at bedtime. otc  1  . diazepam (VALIUM) 5 MG tablet Take 5 mg by mouth 2 (two) times daily as needed. Dr Toy Care  1  . fluticasone [facility]) 50 MCG/ACT nasal spray Place 2 sprays into both nostrils daily. 16 g 11  . oxyCODONE-acetaminophen (PERCOCET) 10-325 MG per tablet Take 1 tablet by mouth every 8 (eight) hours as needed. [facility]  0  . amoxicillin (AMOXIL) 500 MG capsule Take 1 capsule (500 mg total) by mouth 3 (three) times daily. 30 capsule 0   No facility-administered medications prior to visit.      Review of Systems  Constitutional: Negative for chills, fever, malaise/fatigue and weight loss.  HENT: Negative for ear discharge, ear pain and sore throat.   Eyes: Negative for blurred vision.  Respiratory: Negative for cough, sputum production, shortness of breath and wheezing.   Cardiovascular: Negative for chest pain, palpitations, leg swelling and near-syncope.  Gastrointestinal: Negative for abdominal pain, blood in stool, bowel incontinence, constipation, diarrhea, heartburn, melena, nausea and vomiting.  Genitourinary: Negative for bladder incontinence, dysuria, frequency, hematuria and urgency.  Musculoskeletal: Negative for back pain, joint pain, myalgias and neck pain.  Skin: Negative for rash.  Neurological: Negative for dizziness, vertigo, tingling, sensory change, focal weakness, syncope, weakness, headaches and loss of balance.  Endo/Heme/Allergies: Negative for environmental allergies and polydipsia. Does not bruise/bleed easily.  Psychiatric/Behavioral: Negative for depression, memory loss and suicidal ideas. The patient is not nervous/anxious and does not have insomnia.      Objective  Vitals:   02/16/17 1042  BP: 138/70  Pulse: 64  Weight: 189 lb (85.7 kg)  Height: 5\' 8"  (1.727 m)    Physical Exam  Constitutional: She is well-developed, well-nourished, and in no distress. No distress.  HENT:  Head: Normocephalic and atraumatic.  Right Ear: External ear normal.  Left Ear: External ear normal.  Nose: Nose normal.  Mouth/Throat: Oropharynx is clear and moist.  Eyes: Conjunctivae and EOM are normal. Pupils are equal, round, and reactive to light. Right eye exhibits no discharge. Left eye exhibits no discharge.  Neck: Normal range of motion. Neck supple. No JVD present. No thyromegaly present.  Cardiovascular: Normal rate, regular rhythm, S1 normal, S2 normal, normal heart sounds, intact distal pulses and normal pulses. Exam reveals no gallop, no S3, no S4 and  no friction rub.  No murmur heard. Pulses:      Carotid pulses are 2+ on the right side, and 2+ on the left side.      Radial pulses are 2+ on the right side, and 2+ on the left side.       Femoral pulses are 2+ on the right side, and 2+ on the left side.      Dorsalis pedis pulses are 2+ on the right side, and 2+ on the left side.       Posterior tibial pulses are 2+ on the right side, and 2+ on the left side.  No bruits  Pulmonary/Chest: Effort normal and breath sounds normal. She has no wheezes. She has no rales.  Abdominal: Soft. Bowel sounds are normal. She exhibits no mass. There is no tenderness. There is no guarding.  Musculoskeletal: Normal range of motion. She exhibits no edema.  Lymphadenopathy:    She has no cervical adenopathy.  Neurological: She is alert. She has normal strength.  Skin: Skin is warm and dry. She is not diaphoretic.  Psychiatric: Mood and affect normal.  Nursing note and vitals reviewed.     Assessment & Plan  Problem List Items Addressed This Visit    None    Visit Diagnoses    Lumbosacral radiculopathy    -  Primary   Hyperlipidemia, unspecified hyperlipidemia type       Relevant Orders   Lipid panel   Overweight (BMI 25.0-29.9)       Relevant Orders   Lipid panel   Renal function panel      No orders of the defined types were placed in this encounter.     Dr. Macon [facility]  02/16/17

## 2017-02-16 NOTE — Patient Instructions (Addendum)
Calorie Counting for Weight Loss Calories are units of energy. Your body needs a certain amount of calories from food to keep you going throughout the day. When you eat more calories than your body needs, your body stores the extra calories as fat. When you eat fewer calories than your body needs, your body burns fat to get the energy it needs. Calorie counting means keeping track of how many calories you eat and drink each day. Calorie counting can be helpful if you need to lose weight. If you make sure to eat fewer calories than your body needs, you should lose weight. Ask your health care provider what a healthy weight is for you. For calorie counting to work, you will need to eat the right number of calories in a day in order to lose a healthy amount of weight per week. A dietitian can help you determine how many calories you need in a day and will give you suggestions on how to reach your calorie goal.  A healthy amount of weight to lose per week is usually 1-2 lb (0.5-0.9 kg). This usually means that your daily calorie intake should be reduced by 500-750 calories.  Eating 1,200 - 1,500 calories per day can help most women lose weight.  Eating 1,500 - 1,800 calories per day can help most men lose weight.  What is my plan? My goal is to have __________ calories per day. If I have this many calories per day, I should lose around __________ pounds per week. What do I need to know about calorie counting? In order to meet your daily calorie goal, you will need to:  Find out how many calories are in each food you would like to eat. Try to do this before you eat.  Decide how much of the food you plan to eat.  Write down what you ate and how many calories it had. Doing this is called keeping a food log.  To successfully lose weight, it is important to balance calorie counting with a healthy lifestyle that includes regular activity. Aim for 150 minutes of moderate exercise (such as walking) or 75  minutes of vigorous exercise (such as running) each week. Where do I find calorie information?  The number of calories in a food can be found on a Nutrition Facts label. If a food does not have a Nutrition Facts label, try to look up the calories online or ask your dietitian for help. Remember that calories are listed per serving. If you choose to have more than one serving of a food, you will have to multiply the calories per serving by the amount of servings you plan to eat. For example, the label on a package of bread might say that a serving size is 1 slice and that there are 90 calories in a serving. If you eat 1 slice, you will have eaten 90 calories. If you eat 2 slices, you will have eaten 180 calories. How do I keep a food log? Immediately after each meal, record the following information in your food log:  What you ate. Don't forget to include toppings, sauces, and other extras on the food.  How much you ate. This can be measured in cups, ounces, or number of items.  How many calories each food and drink had.  The total number of calories in the meal.  Keep your food log near you, such as in a small notebook in your pocket, or use a mobile app or website. Some   programs will calculate calories for you and show you how many calories you have left for the day to meet your goal. What are some calorie counting tips?  Use your calories on foods and drinks that will fill you up and not leave you hungry: ? Some examples of foods that fill you up are nuts and nut butters, vegetables, lean proteins, and high-fiber foods like whole grains. High-fiber foods are foods with more than 5 g fiber per serving. ? Drinks such as sodas, specialty coffee drinks, alcohol, and juices have a lot of calories, yet do not fill you up.  Eat nutritious foods and avoid empty calories. Empty calories are calories you get from foods or beverages that do not have many vitamins or protein, such as candy, sweets, and  soda. It is better to have a nutritious high-calorie food (such as an avocado) than a food with few nutrients (such as a bag of chips).  Know how many calories are in the foods you eat most often. This will help you calculate calorie counts faster.  Pay attention to calories in drinks. Low-calorie drinks include water and unsweetened drinks.  Pay attention to nutrition labels for "low fat" or "fat free" foods. These foods sometimes have the same amount of calories or more calories than the full fat versions. They also often have added sugar, starch, or salt, to make up for flavor that was removed with the fat.  Find a way of tracking calories that works for you. Get creative. Try different apps or programs if writing down calories does not work for you. What are some portion control tips?  Know how many calories are in a serving. This will help you know how many servings of a certain food you can have.  Use a measuring cup to measure serving sizes. You could also try weighing out portions on a kitchen scale. With time, you will be able to estimate serving sizes for some foods.  Take some time to put servings of different foods on your favorite plates, bowls, and cups so you know what a serving looks like.  Try not to eat straight from a bag or box. Doing this can lead to overeating. Put the amount you would like to eat in a cup or on a plate to make sure you are eating the right portion.  Use smaller plates, glasses, and bowls to prevent overeating.  Try not to multitask (for example, watch TV or use your computer) while eating. If it is time to eat, sit down at a table and enjoy your food. This will help you to know when you are full. It will also help you to be aware of what you are eating and how much you are eating. What are tips for following this plan? Reading food labels  Check the calorie count compared to the serving size. The serving size may be smaller than what you are used to  eating.  Check the source of the calories. Make sure the food you are eating is high in vitamins and protein and low in saturated and trans fats. Shopping  Read nutrition labels while you shop. This will help you make healthy decisions before you decide to purchase your food.  Make a grocery list and stick to it. Cooking  Try to cook your favorite foods in a healthier way. For example, try baking instead of frying.  Use low-fat dairy products. Meal planning  Use more fruits and vegetables. Half of your plate should   be fruits and vegetables.  Include lean proteins like poultry and fish. How do I count calories when eating out?  Ask for smaller portion sizes.  Consider sharing an entree and sides instead of getting your own entree.  If you get your own entree, eat only half. Ask for a box at the beginning of your meal and put the rest of your entree in it so you are not tempted to eat it.  If calories are listed on the menu, choose the lower calorie options.  Choose dishes that include vegetables, fruits, whole grains, low-fat dairy products, and lean protein.  Choose items that are boiled, broiled, grilled, or steamed. Stay away from items that are buttered, battered, fried, or served with cream sauce. Items labeled "crispy" are usually fried, unless stated otherwise.  Choose water, low-fat milk, unsweetened iced tea, or other drinks without added sugar. If you want an alcoholic beverage, choose a lower calorie option such as a glass of wine or light beer.  Ask for dressings, sauces, and syrups on the side. These are usually high in calories, so you should limit the amount you eat.  If you want a salad, choose a garden salad and ask for grilled meats. Avoid extra toppings like bacon, cheese, or fried items. Ask for the dressing on the side, or ask for olive oil and vinegar or lemon to use as dressing.  Estimate how many servings of a food you are given. For example, a serving of  cooked rice is  cup or about the size of half a baseball. Knowing serving sizes will help you be aware of how much food you are eating at restaurants. The list below tells you how big or small some common portion sizes are based on everyday objects: ? 1 oz-4 stacked dice. ? 3 oz-1 deck of cards. ? 1 tsp-1 die. ? 1 Tbsp- a ping-pong ball. ? 2 Tbsp-1 ping-pong ball. ?  cup- baseball. ? 1 cup-1 baseball. Summary  Calorie counting means keeping track of how many calories you eat and drink each day. If you eat fewer calories than your body needs, you should lose weight.  A healthy amount of weight to lose per week is usually 1-2 lb (0.5-0.9 kg). This usually means reducing your daily calorie intake by 500-750 calories.  The number of calories in a food can be found on a Nutrition Facts label. If a food does not have a Nutrition Facts label, try to look up the calories online or ask your dietitian for help.  Use your calories on foods and drinks that will fill you up, and not on foods and drinks that will leave you hungry.  Use smaller plates, glasses, and bowls to prevent overeating. This information is not intended to replace advice given to you by your health care provider. Make sure you discuss any questions you have with your health care provider. Document Released: 03/21/2005 Document Revised: 02/19/2016 Document Reviewed: 02/19/2016 Elsevier Interactive Patient Education  2017 [employer]  LOW-CHOLESTEROL, LOW-TRIGLYCERIDE DIETS    FOODS TO USE   MEATS, FISH Choose lean meats (chicken, [place], veal, and non-fatty cuts of beef with excess fat trimmed; one serving = 3 oz of cooked meat). Also, fresh or frozen fish, canned fish packed in water, and shellfish (lobster, crabs, shrimp, and oysters). Limit use to no more than one serving of one of these per week. Shellfish are high in cholesterol but low in saturated fat and should be used sparingly. Meats  and fish should  be broiled (pan or oven) or baked on a rack.  EGGS Egg substitutes and egg whites (use freely). Egg yolks (limit two per week).  FRUITS Eat three servings of fresh fruit per day (1 serving =  cup). Be sure to have at least one citrus fruit daily. Frozen and canned fruit with no sugar or syrup added may be used.  VEGETABLES Most vegetables are not limited (see next page). One dark-green (string beans, escarole) or one deep yellow (squash) vegetable is recommended daily. Cauliflower, broccoli, and celery, as well as potato skins, are recommended for their fiber content. (Fiber is associated with cholesterol reduction) It is preferable to steam vegetables, but they may be boiled, strained, or braised with polyunsaturated vegetable oil (see below).  BEANS Dried peas or beans (1 serving =  cup) may be used as a bread substitute.  NUTS Almonds, walnuts, and peanuts may be used sparingly  (1 serving = 1 Tablespoonful). Use pumpkin, sesame, or sunflower seeds.  BREADS, GRAINS One roll or one slice of whole grain or enriched bread may be used, or three soda crackers or four pieces of melba toast as a substitute. Spaghetti, rice or noodles ( cup) or  large ear of corn may be used as a bread substitute. In preparing these foods do not use butter or shortening, use soft margarine. Also use egg and sugar substitutes.  Choose high fiber grains, such as oats and whole wheat.  CEREALS Use  cup of hot cereal or  cup of cold cereal per day. Add a sugar substitute if desired, with 99% fat free or skim milk.  MILK PRODUCTS Always use 99% fat free or skim milk, dairy products such as low fat cheeses (farmer's uncreamed diet cottage), low-fat yogurt, and powdered skim milk.  FATS, OILS Use soft (not stick) margarine; vegetable oils that are high in polyunsaturated fats (such as safflower, sunflower, soybean, corn, and cottonseed). Always refrigerate meat drippings to harden the fat and remove it before preparing gravies    DESSERTS, SNACKS Limit to two servings per day; substitute each serving for a bread/cereal serving: ice milk, water sherbet (1/4 cup); unflavored gelatin or gelatin flavored with sugar substitute (1/3 cup); pudding prepared with skim milk (1/2 cup); egg white souffls; unbuttered popcorn (1  cups). Substitute carob for chocolate.  BEVERAGES Fresh fruit juices (limit 4 oz per day); black coffee, plain or herbal teas; soft drinks with sugar substitutes; club soda, preferably salt-free; cocoa made with skim milk or nonfat dried milk and water (sugar substitute added if desired); clear broth. Alcohol: limit two servings per day (see second page).  MISCELLANEOUS  You may use the following freely: vinegar, spices, herbs, nonfat bouillon, mustard, Worcestershire sauce, soy sauce, flavoring essence.                  GUIDELINES FOR  LOW-CHOLESTEROL, LOW TRIGLYCERIDE DIETS    FOODS TO AVOID   MEATS, FISH Marbled beef, pork, bacon, sausage, and other pork products; fatty fowl (duck, goose); skin and fat of [place] and chicken; processed meats; luncheon meats (salami, bologna); frankfurters and fast-food hamburgers (theyre loaded with fat); organ meats (kidneys, liver); canned fish packed in oil.  EGGS Limit egg yolks to two per week.   FRUITS Coconuts (rich in saturated fats).  VEGETABLES Avoid avocados. Starchy vegetables (potatoes, corn, lima beans, dried peas, beans) may be used only if substitutes for a serving of bread or cereal. (Baked potato skin, however, is desirable for its  fiber content.  BEANS Commercial baked beans with sugar and/or pork added.  NUTS Avoid nuts.  Limit peanuts and walnuts to one tablespoonful per day.  BREADS, GRAINS Any baked goods with shortening and/or sugar. Commercial mixes with dried eggs and whole milk. Avoid sweet rolls, doughnuts, breakfast pastries (Danish), and sweetened packaged cereals (the added sugar converts readily to triglycerides).  MILK  PRODUCTS Whole milk and whole-milk packaged goods; cream; ice cream; whole-milk puddings, yogurt, or cheeses; nondairy cream substitutes.  FATS, OILS Butter, lard, animal fats, bacon drippings, gravies, cream sauces as well as palm and coconut oils. All these are high in saturated fats. Examine labels on cholesterol free products for hydrogenated fats. (These are oils that have been hardened into solids and in the process have become saturated.)  DESSERTS, SNACKS Fried snack foods like potato chips; chocolate; candies in general; jams, jellies, syrups; whole- milk puddings; ice cream and milk sherbets; hydrogenated peanut butter.  BEVERAGES Sugared fruit juices and soft drinks; cocoa made with whole milk and/or sugar. When using alcohol (1 oz liquor, 5 oz beer, or 2  oz dry table wine per serving), one serving must be substituted for one bread or cereal serving (limit, two servings of alcohol per day).   SPECIAL NOTES    1. Remember that even non-limited foods should be used in moderation. 2. While on a cholesterol-lowering diet, be sure to avoid animal fats and marbled meats. 3. 3. While on a triglyceride-lowering diet, be sure to avoid sweets and to control the amount of carbohydrates you eat (starchy foods such as flour, bread, potatoes).While on a tri-glyceride-lowering diet, be sure to avoid sweets 4. Buy a good low-fat cookbook, such as the one published by the American Heart Association. 5. Consult your physician if you have any questions.               [facility] Low Glycemic Diet Plan   Low Glycemic Foods (20-49) Moderate Glycemic Foods (50-69) High Glycemic Foods (70-100)      Breakfast Creals Breakfast Cereals Breakfast Cereals  All Bran All-Bran Fruit'n Oats   Bran Buds Bran Chex   Cheerios Corn chex    Fiber One Oatmeal (not instant)   Just Right Mini-Wheats   Corn Flakes Cream of Wheat    Oat Bran Special K Swiss Muesli   Grape Nuts Grape Nut Flakes       Grits Nutri-Grain    Fruits and fruit juice: Fruits Puffed Rice Puffed Wheat    (Limit to 1-2 Servings per day) Banana (under-ride) Dates   Rice Chex Rice Krispies    Apples Apricots (fresh/dried)   Figs Grapes   Shredded Wheat Team    Blackberries Blueberries   Kiwi Mango   Total     Cherries Cranberries   Oranges Raisins     Peaches Pears    Fruits  Plums Prunes   Fruit Juices Pineapple Watermelon    Grapefruit Raspberries   Cranberry Juice Orange Juice   Banana (over-ripe)     Strawberries Tangerines      Apple Juice Grapefruit Juice   Beans and Legumes Beverages  Tomato Juice    [place]-type baked beans Sodas, sweet tea, pineapple juice   Canned pinto, kidney, or navy beans   Beans and Legumes (fresh-cooked) Green peas Vegetables  Black-eyed peas Butter Beans    Potato, baked, boiled, fried, mashed  Chick peas Lentils   Vegetables French fries  Green beans Lima beans   Beets Carrots     Canned or frozen corn  Kidney beans Navy beans   Sweet potato Yam   Parsnips  Pinto beans Snow peas   Corn on the cob Winter squash      Non-starchy vegetables Grains Breads  Asparagus, avocado, broccoli, cabbage Cornmeal Rice, brown   Most breads (white and whole grain)  cauliflower, celery, cucumber, greens Rice, white Couscous   Bagels Bread sticks    lettuce, mushrooms, peppers, tomatoes  Bread stuffing Kaiser roll    okra, onions, spinach, summer squash Pasta Dinner rolls   [employer], cheese     Grains Ravioli, meat filled Spaghetti, white   Grains  Barley Bulgur    Rice, instant Tapioca, with milk    Rye Wild rice   Nuts    Cashews Macadamia   Candy and most cookies  Nuts and oils    Almonds, peanuts, sunflower seeds Snacks Snacks  hazelnuts, pecans, walnuts Chocolate Ice cream, lowfat   Donuts Corn chips    Oils that are liquid at room temperature Muffin Popcorn   Jelly beans Pretzels      Pastries  Dairy, fish, meat, soy, and eggs    Milk,  skim Lowfat cheese    Restaurant and ethnic foods  Yogurt, lowfat, fruit sugar sweetened  Most [place] food (sugar in stir fry    or wok sauce)  Lean red meat Fish    Teriyaki-style meats and vegetables  Skinless chicken and [place], shellfish        Egg whites (up to 3 daily), Soy Products    Egg yolks (up to 7 or _____ per week)      Intermittent Claudication Intermittent claudication is pain in your leg that occurs when you walk or exercise and goes away when you rest. The pain can occur in one or both legs. What are the causes? Intermittent claudication is caused by the buildup of plaque within the major arteries in the body (atherosclerosis). The plaque, which makes arteries stiff and narrow, prevents enough blood from reaching your leg muscles. The pain occurs when you walk or exercise because your muscles need more blood when you are moving and exercising. What increases the risk? Risk factors include:  A family history of atherosclerosis.  A personal history of stroke or heart disease.  Older age.  Being inactive or overweight.  Smoking cigarettes.  Having another health condition such as: ? Diabetes. ? High blood pressure. ? High cholesterol.  What are the signs or symptoms? Your hip or leg may:  Ache.  Cramp.  Feel tight.  Feel weak.  Feel heavy.  Over time, you may feel pain in your calf, thigh, or hip. How is this diagnosed? Your health care provider may diagnose intermittent claudication based on your symptoms and medical history. Your health care provider may also do tests to learn more about your condition. These may include:  Blood tests.  An ultrasound.  Imaging tests such as angiography, magnetic resonance angiography (MRA), and computed tomography angiography (CTA).  How is this treated? You may be treated for problems such as:  High blood pressure.  High cholesterol.  Diabetes.  Other treatments may include:  Lifestyle changes  such as: ? Starting an exercise program. ? Losing weight. ? Quitting smoking.  Medicines to help restore blood flow through your legs.  Blood vessel surgery (angioplasty) to restore blood flow if your intermittent claudication is caused by severe peripheral artery disease.  Follow these instructions at home:  Manage any other health conditions you  have.  Eat a diet low in saturated fats and calories to maintain a healthy weight.  Quit smoking, if you smoke.  Take medicines only as directed by your health care provider.  If your health care provider recommended an exercise program for you, follow it as directed. Your exercise program may involve: ? Walking three or more times a week. ? Walking until you have certain symptoms of intermittent claudication. ? Resting until symptoms go away. ? Gradually increasing walking time to about 50 minutes a day. Contact a health care provider if: Your condition is not getting better or is getting worse. Get help right away if:  You have chest pain.  You have difficulty breathing.  You develop arm weakness.  You have trouble speaking.  Your face begins to droop. This information is not intended to replace advice given to you by your health care provider. Make sure you discuss any questions you have with your health care provider. Document Released: 01/22/2004 Document Revised: 08/27/2015 Document Reviewed: 06/27/2013 Elsevier Interactive Patient Education  2017 [employer].

## 2017-02-17 LAB — LIPID PANEL
CHOL/HDL RATIO: 5.6 ratio — AB (ref 0.0–4.4)
Cholesterol, Total: 234 mg/dL — ABNORMAL HIGH (ref 100–199)
HDL: 42 mg/dL (ref >39)
LDL Calculated: 131 mg/dL — ABNORMAL HIGH (ref 0–99)
Triglycerides: 306 mg/dL — ABNORMAL HIGH (ref 0–149)
VLDL Cholesterol Cal: 61 mg/dL — ABNORMAL HIGH (ref 5–40)

## 2017-02-17 LAB — RENAL FUNCTION PANEL
Albumin: 5 g/dL — ABNORMAL HIGH (ref 3.6–4.8)
BUN/Creatinine Ratio: 18 (ref 12–28)
BUN: 18 mg/dL (ref 8–27)
CO2: 25 mmol/L (ref 20–29)
Calcium: 10.4 mg/dL — ABNORMAL HIGH (ref 8.7–10.3)
Chloride: 99 mmol/L (ref 96–106)
Creatinine, Ser: 0.98 mg/dL (ref 0.57–1.00)
GFR calc Af Amer: 73 mL/min/{1.73_m2} (ref >59)
GFR calc non Af Amer: 63 mL/min/{1.73_m2} (ref >59)
Glucose: 87 mg/dL (ref 65–99)
Phosphorus: 3.5 mg/dL (ref 2.5–4.5)
Potassium: 4.9 mmol/L (ref 3.5–5.2)
Sodium: 140 mmol/L (ref 134–144)

## 2017-03-03 DIAGNOSIS — Z79891 Long term (current) use of opiate analgesic: Secondary | ICD-10-CM | POA: Diagnosis not present

## 2017-03-03 DIAGNOSIS — G894 Chronic pain syndrome: Secondary | ICD-10-CM | POA: Diagnosis not present

## 2017-03-03 DIAGNOSIS — F1120 Opioid dependence, uncomplicated: Secondary | ICD-10-CM | POA: Diagnosis not present

## 2017-03-03 DIAGNOSIS — G8929 Other chronic pain: Secondary | ICD-10-CM | POA: Diagnosis not present

## 2017-03-10 ENCOUNTER — Telehealth: Payer: Self-pay

## 2017-03-10 ENCOUNTER — Other Ambulatory Visit: Payer: Self-pay

## 2017-03-10 DIAGNOSIS — J329 Chronic sinusitis, unspecified: Secondary | ICD-10-CM

## 2017-03-10 MED ORDER — AZITHROMYCIN 250 MG PO TABS: ORAL_TABLET | ORAL | 0 refills | Status: DC

## 2017-03-10 NOTE — Telephone Encounter (Signed)
Called in [place] to [place]

## 2017-03-30 DIAGNOSIS — F1120 Opioid dependence, uncomplicated: Secondary | ICD-10-CM | POA: Diagnosis not present

## 2017-03-30 DIAGNOSIS — M25551 Pain in right hip: Secondary | ICD-10-CM | POA: Diagnosis not present

## 2017-03-30 DIAGNOSIS — G8929 Other chronic pain: Secondary | ICD-10-CM | POA: Diagnosis not present

## 2017-03-30 DIAGNOSIS — M25511 Pain in right shoulder: Secondary | ICD-10-CM | POA: Diagnosis not present

## 2017-03-30 DIAGNOSIS — Z79891 Long term (current) use of opiate analgesic: Secondary | ICD-10-CM | POA: Diagnosis not present

## 2017-03-30 DIAGNOSIS — G894 Chronic pain syndrome: Secondary | ICD-10-CM | POA: Diagnosis not present

## 2017-03-30 DIAGNOSIS — M25562 Pain in left knee: Secondary | ICD-10-CM | POA: Diagnosis not present

## 2017-04-28 DIAGNOSIS — M25562 Pain in left knee: Secondary | ICD-10-CM | POA: Diagnosis not present

## 2017-04-28 DIAGNOSIS — F1120 Opioid dependence, uncomplicated: Secondary | ICD-10-CM | POA: Diagnosis not present

## 2017-04-28 DIAGNOSIS — M25571 Pain in right ankle and joints of right foot: Secondary | ICD-10-CM | POA: Diagnosis not present

## 2017-04-28 DIAGNOSIS — M25511 Pain in right shoulder: Secondary | ICD-10-CM | POA: Diagnosis not present

## 2017-04-28 DIAGNOSIS — G894 Chronic pain syndrome: Secondary | ICD-10-CM | POA: Diagnosis not present

## 2017-04-28 DIAGNOSIS — Z79891 Long term (current) use of opiate analgesic: Secondary | ICD-10-CM | POA: Diagnosis not present

## 2017-04-28 DIAGNOSIS — G8929 Other chronic pain: Secondary | ICD-10-CM | POA: Diagnosis not present

## 2017-05-26 DIAGNOSIS — M25552 Pain in left hip: Secondary | ICD-10-CM | POA: Diagnosis not present

## 2017-05-26 DIAGNOSIS — M25551 Pain in right hip: Secondary | ICD-10-CM | POA: Diagnosis not present

## 2017-05-26 DIAGNOSIS — F1120 Opioid dependence, uncomplicated: Secondary | ICD-10-CM | POA: Diagnosis not present

## 2017-05-26 DIAGNOSIS — M25562 Pain in left knee: Secondary | ICD-10-CM | POA: Diagnosis not present

## 2017-05-26 DIAGNOSIS — G894 Chronic pain syndrome: Secondary | ICD-10-CM | POA: Diagnosis not present

## 2017-05-26 DIAGNOSIS — Z79891 Long term (current) use of opiate analgesic: Secondary | ICD-10-CM | POA: Diagnosis not present

## 2017-05-26 DIAGNOSIS — G8929 Other chronic pain: Secondary | ICD-10-CM | POA: Diagnosis not present

## 2017-06-22 DIAGNOSIS — Z79891 Long term (current) use of opiate analgesic: Secondary | ICD-10-CM | POA: Diagnosis not present

## 2017-06-22 DIAGNOSIS — M25571 Pain in right ankle and joints of right foot: Secondary | ICD-10-CM | POA: Diagnosis not present

## 2017-06-22 DIAGNOSIS — F3111 Bipolar disorder, current episode manic without psychotic features, mild: Secondary | ICD-10-CM | POA: Diagnosis not present

## 2017-06-22 DIAGNOSIS — M25551 Pain in right hip: Secondary | ICD-10-CM | POA: Diagnosis not present

## 2017-06-22 DIAGNOSIS — G8929 Other chronic pain: Secondary | ICD-10-CM | POA: Diagnosis not present

## 2017-06-22 DIAGNOSIS — F1120 Opioid dependence, uncomplicated: Secondary | ICD-10-CM | POA: Diagnosis not present

## 2017-06-22 DIAGNOSIS — M25511 Pain in right shoulder: Secondary | ICD-10-CM | POA: Diagnosis not present

## 2017-06-22 DIAGNOSIS — M25552 Pain in left hip: Secondary | ICD-10-CM | POA: Diagnosis not present

## 2017-06-22 DIAGNOSIS — G894 Chronic pain syndrome: Secondary | ICD-10-CM | POA: Diagnosis not present

## 2017-07-05 ENCOUNTER — Other Ambulatory Visit: Payer: Self-pay

## 2017-07-06 ENCOUNTER — Encounter: Payer: Self-pay | Admitting: Family Medicine

## 2017-07-06 ENCOUNTER — Ambulatory Visit: Payer: BLUE CROSS/BLUE SHIELD | Admitting: Family Medicine

## 2017-07-06 VITALS — BP 120/80 | HR 70 | Ht 68.0 in | Wt 194.0 lb

## 2017-07-06 DIAGNOSIS — N3000 Acute cystitis without hematuria: Secondary | ICD-10-CM | POA: Diagnosis not present

## 2017-07-06 LAB — POCT URINALYSIS DIPSTICK
Bilirubin, UA: NEGATIVE
Blood, UA: NEGATIVE
Glucose, UA: NEGATIVE
Ketones, UA: NEGATIVE
Leukocytes, UA: NEGATIVE
Nitrite, UA: NEGATIVE
Protein, UA: NEGATIVE
Spec Grav, UA: 1.015
Urobilinogen, UA: 0.2 U/dL
pH, UA: 7

## 2017-07-06 MED ORDER — FLUCONAZOLE 150 MG PO TABS: 150.0000 mg | ORAL_TABLET | Freq: Once | ORAL | 0 refills | Status: AC

## 2017-07-06 MED ORDER — NITROFURANTOIN MONOHYD MACRO 100 MG PO CAPS: 100.0000 mg | ORAL_CAPSULE | Freq: Two times a day (BID) | ORAL | 0 refills | Status: DC

## 2017-07-06 NOTE — Progress Notes (Signed)
Name: Kaitlyn Ali   MRN: [id number]    DOB: January 14, 1956   Date:07/06/2017       Progress Note  Subjective  Chief Complaint  Chief Complaint  Patient presents with  . Urinary Tract Infection    burning pain- took a doxy and feels better    Urinary Tract Infection   This is a new problem. The current episode started in the past 7 days. The problem occurs every urination. The problem has been waxing and waning. The quality of the pain is described as burning. The pain is at a severity of 5/10. The pain is moderate. There has been no fever. Associated symptoms include frequency, nausea and urgency. Pertinent negatives include no chills, discharge, flank pain, hematuria, hesitancy, sweats or vomiting. She has tried nothing for the symptoms. The treatment provided moderate relief. There is no history of recurrent UTIs.    No problem-specific Assessment & Plan notes found for this encounter.   Past Medical History:  Diagnosis Date  . Anxiety   . Bursitis   . Depression   . Panic attack     Past Surgical History:  Procedure Laterality Date  . TOE SURGERY     shaved bone in R) great toe  . TONSILLECTOMY    . VAGINAL HYSTERECTOMY      Family History  Problem Relation Age of Onset  . Diabetes Mother   . Cancer Father   . Cancer Sister     Social History   Socioeconomic History  . Marital status: Married    Spouse name: Not on file  . Number of children: Not on file  . Years of education: Not on file  . Highest education level: Not on file  Occupational History  . Not on file  Social Needs  . Financial resource strain: Not on file  . Food insecurity:    Worry: Not on file    Inability: Not on file  . Transportation needs:    Medical: Not on file    Non-medical: Not on file  Tobacco Use  . Smoking status: Former Research scientist (life sciences)  . Smokeless tobacco: Never Used  Substance and Sexual Activity  . Alcohol use: Yes    Alcohol/week: 0.0 oz  . Drug use: No  . Sexual  activity: Yes  Lifestyle  . Physical activity:    Days per week: Not on file    Minutes per session: Not on file  . Stress: Not on file  Relationships  . Social connections:    Talks on phone: Not on file    Gets together: Not on file    Attends religious service: Not on file    Active member of club or organization: Not on file    Attends meetings of clubs or organizations: Not on file    Relationship status: Not on file  . Intimate partner violence:    Fear of current or ex partner: Not on file    Emotionally abused: Not on file    Physically abused: Not on file    Forced sexual activity: Not on file  Other Topics Concern  . Not on file  Social History Narrative  . Not on file    Allergies  Allergen Reactions  . Doxycycline Itching  . Nsaids     Bothers stomach    Outpatient Medications Prior to Visit  Medication Sig Dispense Refill  . buPROPion (WELLBUTRIN XL) 300 MG 24 hr tablet Take 1 tablet (300 mg total) by mouth every morning.  Dr Bridgett Larsson (Patient taking differently: Take 150 mg by mouth every morning. Dr Toy Care) 30 tablet 0  . [facility] MELATONIN 3 MG TABS Take 1 tablet by mouth at bedtime. otc  1  . diazepam (VALIUM) 5 MG tablet Take 5 mg by mouth 2 (two) times daily as needed. Dr Toy Care  1  . fluticasone [facility]) 50 MCG/ACT nasal spray Place 2 sprays into both nostrils daily. 16 g 11  . oxyCODONE-acetaminophen (PERCOCET) 10-325 MG per tablet Take 1 tablet by mouth every 8 (eight) hours as needed. [facility] pain [facility]  0  . azithromycin (ZITHROMAX) 250 MG tablet Use as directed 6 tablet 0   No facility-administered medications prior to visit.     Review of Systems  Constitutional: Negative for chills, fever, malaise/fatigue and weight loss.  HENT: Negative for ear discharge, ear pain and sore throat.   Eyes: Negative for blurred vision.  Respiratory: Negative for cough, sputum production, shortness of breath and wheezing.   Cardiovascular: Negative for chest pain,  palpitations and leg swelling.  Gastrointestinal: Positive for nausea. Negative for abdominal pain, blood in stool, constipation, diarrhea, heartburn, melena and vomiting.  Genitourinary: Positive for frequency and urgency. Negative for dysuria, flank pain, hematuria and hesitancy.  Musculoskeletal: Negative for back pain, joint pain, myalgias and neck pain.  Skin: Negative for rash.  Neurological: Negative for dizziness, tingling, sensory change, focal weakness and headaches.  Endo/Heme/Allergies: Negative for environmental allergies and polydipsia. Does not bruise/bleed easily.  Psychiatric/Behavioral: Negative for depression and suicidal ideas. The patient is not nervous/anxious and does not have insomnia.      Objective  Vitals:   07/06/17 1342  BP: 120/80  Pulse: 70  Weight: 194 lb (88 kg)  Height: 5\' 8"  (1.727 m)    Physical Exam  Constitutional: She is well-developed, well-nourished, and in no distress. No distress.  HENT:  Head: Normocephalic and atraumatic.  Right Ear: External ear normal.  Left Ear: External ear normal.  Nose: Nose normal.  Mouth/Throat: Oropharynx is clear and moist.  Eyes: Pupils are equal, round, and reactive to light. Conjunctivae and EOM are normal. Right eye exhibits no discharge. Left eye exhibits no discharge.  Neck: Normal range of motion. Neck supple. No JVD present. No thyromegaly present.  Cardiovascular: Normal rate, regular rhythm, normal heart sounds and intact distal pulses. Exam reveals no gallop and no friction rub.  No murmur heard. Pulmonary/Chest: Effort normal and breath sounds normal. She has no wheezes. She has no rales.  Abdominal: Soft. Bowel sounds are normal. She exhibits no mass. There is no hepatosplenomegaly. There is tenderness in the suprapubic area. There is no rebound, no guarding and no CVA tenderness.  Musculoskeletal: Normal range of motion. She exhibits no edema.  Lymphadenopathy:    She has no cervical adenopathy.   Neurological: She is alert. She has normal reflexes.  Skin: Skin is warm and dry. She is not diaphoretic.  Psychiatric: Mood and affect normal.  Nursing note and vitals reviewed.     Assessment & Plan  Problem List Items Addressed This Visit    None    Visit Diagnoses    Acute cystitis without hematuria    -  Primary   Relevant Medications   nitrofurantoin, macrocrystal-monohydrate, (MACROBID) 100 MG capsule   fluconazole (DIFLUCAN) 150 MG tablet   Other Relevant Orders   POCT Urinalysis Dipstick (Completed)      Meds ordered this encounter  Medications  . nitrofurantoin, macrocrystal-monohydrate, (MACROBID) 100 MG capsule    Sig:  Take 1 capsule (100 mg total) by mouth 2 (two) times daily.    Dispense:  6 capsule    Refill:  0  . fluconazole (DIFLUCAN) 150 MG tablet    Sig: Take 1 tablet (150 mg total) by mouth once for 1 dose.    Dispense:  1 tablet    Refill:  0      Dr. Otilio Miu [facility]  07/06/17

## 2017-07-08 DIAGNOSIS — F3342 Major depressive disorder, recurrent, in full remission: Secondary | ICD-10-CM | POA: Diagnosis not present

## 2017-07-17 ENCOUNTER — Telehealth: Payer: Self-pay

## 2017-07-17 ENCOUNTER — Other Ambulatory Visit: Payer: Self-pay

## 2017-07-17 DIAGNOSIS — N3000 Acute cystitis without hematuria: Secondary | ICD-10-CM

## 2017-07-17 MED ORDER — FLUCONAZOLE 150 MG PO TABS: 150.0000 mg | ORAL_TABLET | Freq: Once | ORAL | 0 refills | Status: AC

## 2017-07-17 MED ORDER — NITROFURANTOIN MONOHYD MACRO 100 MG PO CAPS: 100.0000 mg | ORAL_CAPSULE | Freq: Two times a day (BID) | ORAL | 0 refills | Status: DC

## 2017-07-17 NOTE — Telephone Encounter (Signed)
Pt called wanting longer nitrofurantoin Rx and diflucan. States that UTIs are coming back. I explained if this doesn't clear up, next step is urology

## 2017-09-04 ENCOUNTER — Ambulatory Visit: Payer: BLUE CROSS/BLUE SHIELD | Admitting: Family Medicine

## 2017-09-04 ENCOUNTER — Encounter: Payer: Self-pay | Admitting: Family Medicine

## 2017-09-04 VITALS — BP 120/100 | HR 66 | Temp 98.1°F | Resp 16 | Ht 68.0 in | Wt 190.0 lb

## 2017-09-04 DIAGNOSIS — E663 Overweight: Secondary | ICD-10-CM | POA: Diagnosis not present

## 2017-09-04 DIAGNOSIS — L309 Dermatitis, unspecified: Secondary | ICD-10-CM | POA: Diagnosis not present

## 2017-09-04 DIAGNOSIS — J0100 Acute maxillary sinusitis, unspecified: Secondary | ICD-10-CM

## 2017-09-04 DIAGNOSIS — J302 Other seasonal allergic rhinitis: Secondary | ICD-10-CM

## 2017-09-04 DIAGNOSIS — J301 Allergic rhinitis due to pollen: Secondary | ICD-10-CM | POA: Diagnosis not present

## 2017-09-04 DIAGNOSIS — M170 Bilateral primary osteoarthritis of knee: Secondary | ICD-10-CM | POA: Diagnosis not present

## 2017-09-04 MED ORDER — PREDNISONE 10 MG PO TABS: ORAL_TABLET | ORAL | 1 refills | Status: DC

## 2017-09-04 MED ORDER — AZITHROMYCIN 250 MG PO TABS: ORAL_TABLET | ORAL | 0 refills | Status: DC

## 2017-09-04 MED ORDER — TRIAMCINOLONE ACETONIDE 0.1 % EX CREA: 1.0000 "application " | TOPICAL_CREAM | Freq: Two times a day (BID) | CUTANEOUS | 0 refills | Status: DC

## 2017-09-04 NOTE — Progress Notes (Signed)
Name: Kaitlyn Ali   MRN: [id number]    DOB: 1955-04-18   Date:09/04/2017       Progress Note  Subjective  Chief Complaint  Chief Complaint  Patient presents with  . Sinus Problem    5 days   . Cough    5 days  . Urticaria    5 days     Sinus Problem  This is a new problem. The current episode started in the past 7 days. The problem has been gradually worsening since onset. There has been no fever. Associated symptoms include sinus pressure. Pertinent negatives include no chills, congestion, coughing, diaphoresis, ear pain, headaches, hoarse voice, neck pain, shortness of breath, sneezing, sore throat or swollen glands. Past treatments include nothing. The treatment provided moderate relief.  Cough  This is a chronic problem. The current episode started in the past 7 days (onset Saturday). The problem has been gradually worsening. Associated symptoms include a rash. Pertinent negatives include no chest pain, chills, ear pain, fever, headaches, heartburn, myalgias, sore throat, shortness of breath, weight loss or wheezing. The symptoms are aggravated by pollens (essential oils). Treatments tried: antihistamine. The treatment provided mild relief. There is no history of environmental allergies.  Rash  This is a new problem. The current episode started in the past 7 days. The problem has been gradually worsening since onset. The rash is characterized by itchiness. Pertinent negatives include no congestion, cough, diarrhea, fever, joint pain, shortness of breath or sore throat. The treatment provided moderate relief.    No problem-specific Assessment & Plan notes found for this encounter.   Past Medical History:  Diagnosis Date  . Anxiety   . Bursitis   . Depression   . Panic attack     Past Surgical History:  Procedure Laterality Date  . LAPAROSCOPIC HYSTERECTOMY    . TOE SURGERY     shaved bone in R) great toe  . TONSILLECTOMY      Family History  Problem Relation Age of  Onset  . Diabetes Mother   . Cancer Father   . Cancer Sister     Social History   Socioeconomic History  . Marital status: Married    Spouse name: Not on file  . Number of children: Not on file  . Years of education: Not on file  . Highest education level: Not on file  Occupational History  . Not on file  Social Needs  . Financial resource strain: Not on file  . Food insecurity:    Worry: Not on file    Inability: Not on file  . Transportation needs:    Medical: Not on file    Non-medical: Not on file  Tobacco Use  . Smoking status: Former Research scientist (life sciences)  . Smokeless tobacco: Never Used  Substance and Sexual Activity  . Alcohol use: Yes    Alcohol/week: 0.0 oz  . Drug use: No  . Sexual activity: Yes  Lifestyle  . Physical activity:    Days per week: Not on file    Minutes per session: Not on file  . Stress: Not on file  Relationships  . Social connections:    Talks on phone: Not on file    Gets together: Not on file    Attends religious service: Not on file    Active member of club or organization: Not on file    Attends meetings of clubs or organizations: Not on file    Relationship status: Not on file  . Intimate  partner violence:    Fear of current or ex partner: Not on file    Emotionally abused: Not on file    Physically abused: Not on file    Forced sexual activity: Not on file  Other Topics Concern  . Not on file  Social History Narrative  . Not on file    Allergies  Allergen Reactions  . Doxycycline Itching  . Nsaids     Bothers stomach    Outpatient Medications Prior to Visit  Medication Sig Dispense Refill  . buPROPion (WELLBUTRIN XL) 300 MG 24 hr tablet Take 1 tablet (300 mg total) by mouth every morning. Dr Bridgett Larsson (Patient taking differently: Take 150 mg by mouth every morning. Dr Toy Care) 30 tablet 0  . diazepam (VALIUM) 5 MG tablet Take 5 mg by mouth 2 (two) times daily as needed. Dr Toy Care  1  . fluticasone [facility]) 50 MCG/ACT nasal spray Place 2  sprays into both nostrils daily. 16 g 11  . oxyCODONE-acetaminophen (PERCOCET) 10-325 MG per tablet Take 1 tablet by mouth every 8 (eight) hours as needed. [facility] pain management  0  . [facility] MELATONIN 3 MG TABS Take 1 tablet by mouth at bedtime. otc  1  . divalproex (DEPAKOTE ER) 500 MG 24 hr tablet   4  . nitrofurantoin, macrocrystal-monohydrate, (MACROBID) 100 MG capsule Take 1 capsule (100 mg total) by mouth 2 (two) times daily. 14 capsule 0   No facility-administered medications prior to visit.     Review of Systems  Constitutional: Negative for chills, diaphoresis, fever, malaise/fatigue and weight loss.  HENT: Positive for sinus pressure. Negative for congestion, ear discharge, ear pain, hoarse voice, sneezing and sore throat.   Eyes: Negative for blurred vision.  Respiratory: Negative for cough, sputum production, shortness of breath and wheezing.   Cardiovascular: Negative for chest pain, palpitations and leg swelling.  Gastrointestinal: Negative for abdominal pain, blood in stool, constipation, diarrhea, heartburn, melena and nausea.  Genitourinary: Negative for dysuria, frequency, hematuria and urgency.  Musculoskeletal: Negative for back pain, joint pain, myalgias and neck pain.  Skin: Positive for rash.  Neurological: Negative for dizziness, tingling, sensory change, focal weakness and headaches.  Endo/Heme/Allergies: Negative for environmental allergies and polydipsia. Does not bruise/bleed easily.  Psychiatric/Behavioral: Negative for depression and suicidal ideas. The patient is not nervous/anxious and does not have insomnia.      Objective  Vitals:   09/04/17 1605  BP: (!) 120/100  Pulse: 66  Resp: 16  Temp: 98.1 F (36.7 C)  TempSrc: Oral  SpO2: 97%  Weight: 190 lb (86.2 kg)  Height: 5\' 8"  (1.727 m)    Physical Exam  Constitutional: No distress.  HENT:  Head: Normocephalic and atraumatic.  Right Ear: External ear normal.  Left Ear: External ear normal.   Nose: Nose normal.  Mouth/Throat: Oropharynx is clear and moist.  Eyes: Pupils are equal, round, and reactive to light. Conjunctivae and EOM are normal. Right eye exhibits no discharge. Left eye exhibits no discharge.  Neck: Normal range of motion. Neck supple. No JVD present. No thyromegaly present.  Cardiovascular: Normal rate, regular rhythm, normal heart sounds and intact distal pulses. Exam reveals no gallop and no friction rub.  No murmur heard. Pulmonary/Chest: Effort normal and breath sounds normal.  Abdominal: Soft. Bowel sounds are normal. She exhibits no mass. There is no tenderness. There is no guarding.  Musculoskeletal: Normal range of motion. She exhibits no edema.  Lymphadenopathy:    She has no cervical adenopathy.  Neurological: She is alert. She has normal reflexes.  Skin: Skin is warm and dry. She is not diaphoretic.  Multiple punctate some small vesicle with erythematous base  Nursing note and vitals reviewed. Health risks of being over weight were discussed and patient was counseled on weight loss options and exercise.    Assessment & Plan  Problem List Items Addressed This Visit    None    Visit Diagnoses    Dermatitis    -  Primary   differential includes eczema/insect bites contact dermatitis/will bgin prednisone taper with zyrtec in am then benedryl during day. exam bed and couch areas   Relevant Medications   predniSONE (DELTASONE) 10 MG tablet   triamcinolone cream (KENALOG) 0.1 %   Acute maxillary sinusitis, recurrence not specified       Continued sinus drainage. will start azithromycin   Relevant Medications   predniSONE (DELTASONE) 10 MG tablet   azithromycin (ZITHROMAX) 250 MG tablet   Overweight       weight loss discussed      Meds ordered this encounter  Medications  . predniSONE (DELTASONE) 10 MG tablet    Sig: Taper 6,6,6,5,5,5,4,4,3,3,2,2,1,1    Dispense:  53 tablet    Refill:  1  . triamcinolone cream (KENALOG) 0.1 %    Sig:  Apply 1 application topically 2 (two) times daily.    Dispense:  60 g    Refill:  0  . azithromycin (ZITHROMAX) 250 MG tablet    Sig: 2 today then 1 day for 4 days    Dispense:  6 tablet    Refill:  0      Dr. Mac[facility]  09/04/17

## 2017-09-04 NOTE — Patient Instructions (Signed)
Insect Bite, Adult An insect bite can make your skin red, itchy, and swollen. An insect bite is different from an insect sting, which happens when an insect injects poison (venom) into the skin. Some insects can spread disease to people through a bite. However, most insect bites do not lead to disease and are not serious. What are the causes? Insects may bite for a variety of reasons, including:  Hunger.  To defend themselves.  Insects that bite include:  Spiders.  Mosquitoes.  Ticks.  Fleas.  Ants.  Flies.  Bedbugs.  What are the signs or symptoms? Symptoms of this condition include:  Itching or pain in the bite area.  Redness and swelling in the bite area.  An open wound (skin ulcer).  In many cases, symptoms last for 2-4 days. How is this diagnosed? This condition is usually diagnosed based on symptoms and a physical exam. How is this treated? Treatment is usually not needed. Symptoms often go away on their own. When treatment is recommended, it may involve:  Applying a cream or lotion to the bitten area. This treatment helps with itching.  Taking an antibiotic medicine. This treatment is needed if the bite area gets infected.  Getting a tetanus shot.  Applying ice to the affected area.  Medicines called antihistamines. This treatment is needed if you develop an allergic reaction to the insect bite.  Follow these instructions at home: Bite area care  Do not scratch the bite area.  Keep the bite area clean and dry. Wash it every day with soap and water as told by your health care provider.  Check the bite area every day for signs of infection. Check for: ? More redness, swelling, or pain. ? Fluid or blood. ? Warmth. ? Pus. Managing pain, itching, and swelling   You may apply a baking soda paste, cortisone cream, or calamine lotion to the bite area as told by your health care provider.  If directed, applyice to the bite area. ? Put ice in a  plastic bag. ? Place a towel between your skin and the bag. ? Leave the ice on for 20 minutes, 2-3 times per day. Medicines  Apply or take over-the-counter and prescription medicines only as told by your health care provider.  If you were prescribed an antibiotic medicine, use it as told by your health care provider. Do not stop using the antibiotic even if your condition improves. General instructions  Keep all follow-up visits as told by your health care provider. This is important. How is this prevented? To help reduce your risk of insect bites:  When you are outdoors, wear clothing that covers your arms and legs.  Use insect repellent. The best insect repellents contain: ? DEET, picaridin, oil of lemon eucalyptus (OLE), or IR3535. ? Higher amounts of an active ingredient.  If your home windows do not have screens, consider installing them.  Contact a health care provider if:  You have more redness, swelling, or pain in the bite area.  You have fluid, blood, or pus coming from the bite area.  The bite area feels warm to the touch.  You have a fever. Get help right away if:  You have joint pain.  You have a rash.  You have shortness of breath.  You feel unusually tired or sleepy.  You have neck pain.  You have a headache.  You have unusual weakness.  You have chest pain.  You have nausea, vomiting, or pain in the abdomen. This   information is not intended to replace advice given to you by your health care provider. Make sure you discuss any questions you have with your health care provider. Document Released: 04/28/2004 Document Revised: 11/18/2015 Document Reviewed: 09/28/2015 Elsevier Interactive Patient Education  2018 [employer]  

## 2017-09-05 MED ORDER — FLUTICASONE PROPIONATE 50 MCG/ACT NA SUSP: 2.0000 | Freq: Every day | NASAL | 1 refills | Status: DC

## 2017-09-05 NOTE — Addendum Note (Signed)
Addended by: Berton Mount L on: 09/05/2017 11:50 AM   Modules accepted: Orders

## 2017-09-27 ENCOUNTER — Other Ambulatory Visit: Payer: Self-pay | Admitting: Family Medicine

## 2017-09-27 DIAGNOSIS — J302 Other seasonal allergic rhinitis: Secondary | ICD-10-CM

## 2017-09-27 DIAGNOSIS — J301 Allergic rhinitis due to pollen: Secondary | ICD-10-CM

## 2017-10-11 ENCOUNTER — Encounter: Payer: Self-pay | Admitting: Nurse Practitioner

## 2017-10-11 ENCOUNTER — Other Ambulatory Visit: Payer: Self-pay

## 2017-10-11 ENCOUNTER — Ambulatory Visit: Payer: BLUE CROSS/BLUE SHIELD | Attending: Nurse Practitioner | Admitting: Nurse Practitioner

## 2017-10-11 DIAGNOSIS — G8929 Other chronic pain: Secondary | ICD-10-CM | POA: Diagnosis not present

## 2017-10-11 DIAGNOSIS — N189 Chronic kidney disease, unspecified: Secondary | ICD-10-CM | POA: Diagnosis not present

## 2017-10-11 DIAGNOSIS — Z789 Other specified health status: Secondary | ICD-10-CM | POA: Insufficient documentation

## 2017-10-11 DIAGNOSIS — M899 Disorder of bone, unspecified: Secondary | ICD-10-CM | POA: Insufficient documentation

## 2017-10-11 DIAGNOSIS — M25562 Pain in left knee: Secondary | ICD-10-CM | POA: Insufficient documentation

## 2017-10-11 DIAGNOSIS — Z87442 Personal history of urinary calculi: Secondary | ICD-10-CM | POA: Diagnosis not present

## 2017-10-11 DIAGNOSIS — M25511 Pain in right shoulder: Secondary | ICD-10-CM | POA: Diagnosis not present

## 2017-10-11 DIAGNOSIS — G894 Chronic pain syndrome: Secondary | ICD-10-CM | POA: Diagnosis not present

## 2017-10-11 DIAGNOSIS — Z79899 Other long term (current) drug therapy: Secondary | ICD-10-CM | POA: Insufficient documentation

## 2017-10-11 DIAGNOSIS — M25561 Pain in right knee: Secondary | ICD-10-CM | POA: Diagnosis not present

## 2017-10-11 DIAGNOSIS — Z79891 Long term (current) use of opiate analgesic: Secondary | ICD-10-CM | POA: Insufficient documentation

## 2017-10-11 NOTE — Patient Instructions (Signed)

## 2017-10-11 NOTE — Progress Notes (Signed)
Patient's Name: Kaitlyn Ali  MRN: [id number]  Referring Provider: Renata Caprice  DOB: 1955-07-21  PCP: Juline Patch, MD  DOS: 10/11/2017  Note by: Dionisio David NP  Service setting: Ambulatory outpatient  Specialty: Interventional Pain Management  Location: [facility] (AMB) Pain Management Facility    Patient type: New Patient    Primary Reason(s) for Visit: Initial Patient Evaluation CC: Shoulder Pain (right) and Knee Pain (left)  HPI  Kaitlyn Ali is a 62 y.o. year old, female patient, who comes today for an initial evaluation. She has Chronic pain of both knees; Chronic right shoulder pain; Chronic pain syndrome; Long term current use of opiate analgesic; Pharmacologic therapy; Disorder of skeletal system; and Problems influencing health status on their problem list.. Her primarily concern today is the Shoulder Pain (right) and Knee Pain (left)  Pain Assessment: Location: Right Shoulder Radiating: denies Onset: More than a month ago Duration: Chronic pain Quality: Aching, Constant Severity: 2 /10 (subjective, self-reported pain score)  Note: Reported level is compatible with observation.                          Timing: Constant Modifying factors: medications, yoga, essential oils BP: (!) 147/84  HR: 80  Onset and Duration: Date of onset: 10 years ago Cause of pain:  Severity: NAS-11 at its worse: 8/10, NAS-11 at its best: 1/10, NAS-11 now: 2/10 and NAS-11 on the average: 2/10 Timing: Morning, Not influenced by the time of the day and During activity or exercise Aggravating Factors: Bending, Climbing, Lifiting, Prolonged sitting, Prolonged standing, Squatting, Walking, Walking uphill and Walking downhill Alleviating Factors: Stretching, Cold packs, Hot packs, Lying down, Medications, Resting, Using a brace and Relaxation therapy Associated Problems: Depression and Pain that wakes patient up Quality of Pain: Aching and Throbbing Previous Examinations or Tests: Bone scan,  X-rays, Orthopedic evaluation and Psychiatric evaluation Previous Treatments: Narcotic medications and Stretching exercises  The patient comes into the clinics today for the first time for a chronic pain management evaluation.  Accordingto the patient her primary area of pain is in her right shoulder.  He denies any precipitating factors.  He denies any previous surgery, interventional therapy.  She admits that she uses heat ice and yoga stretches.  Her second area of pain is in her knees.  She admits the left is greater than the right.  He admits that she has swelling worse with walking.  Denies any previous surgery, interventional therapy or recent physical therapy.  He admits that she was patient [facility] however there are no longer taking her insurance.  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  Today I took the time to provide the patient with information regarding this pain practice. The patient was informed that the practice is divided into two sections: an interventional pain management section, as well as a completely separate and distinct medication management section. I explained that there are procedure days for interventional therapies, and evaluation days for follow-ups and medication management. Because of the amount of documentation required during both, they are kept separated. This means that there is the possibility that she may be scheduled for a procedure on one day, and medication management the next. I have also informed her that because of staffing and facility limitations, this practice will no longer take patients for medication management only. To illustrate the reasons for this, I  gave the patient the example of surgeons, and how inappropriate it would be to refer a patient to his/her care, just to write for the post-surgical antibiotics on a surgery done by a different surgeon.   Because  interventional pain management is part of the board-certified specialty for the doctors, the patient was informed that joining this practice means that they are open to any and all interventional therapies. I made it clear that this does not mean that they will be forced to have any procedures done. What this means is that I believe interventional therapies to be essential part of the diagnosis and proper management of chronic pain conditions. Therefore, patients not interested in these interventional alternatives will be better served under the care of a different practitioner.  The patient was also made aware of my Comprehensive Pain Management Safety Guidelines where by joining this practice, they limit all of their nerve blocks and joint injections to those done by our practice, for as long as we are retained to manage their care. Historic Controlled Substance Pharmacotherapy Review  PMP and historical list of controlled substances: oxycodone/acetaminophen 10/325 mg, diazepam 5 mg, trazodone ER 1 mg, oxycodone 50 mg, Highest opioid analgesic regimen found: oxycodone/acetaminophen '15mg'$ , 2 tablets every 4 hours (fill date 05/20/2013) oxycodone 90 mg/day Most recent opioid analgesic: oxycodone/acetaminophen 10/325 mg, Current opioid analgesics: oxycodone/acetaminophen 10/325 mg, Highest recorded MME/day: '45mg'$ /day MME/day:45 mg/day Medications: The patient did not bring the medication(s) to the appointment, as requested in our "New Patient Package" Pharmacodynamics: Desired effects: Analgesia: The patient reports >50% benefit. Reported improvement in function: The patient reports medication allows her to accomplish basic ADLs. Clinically meaningful improvement in function (CMIF): Sustained CMIF goals met Perceived effectiveness: Described as relatively effective, allowing for increase in activities of daily living (ADL) Undesirable effects: Side-effects or Adverse reactions: None  reported Historical Monitoring: The patient  reports that she does not use drugs. List of all UDS Test(s): No results found for: MDMA, COCAINSCRNUR, PCPSCRNUR, PCPQUANT, CANNABQUANT, THCU, [place] List of all Serum Drug Screening Test(s):  No results found for: AMPHSCRSER, BARBSCRSER, BENZOSCRSER, COCAINSCRSER, PCPSCRSER, PCPQUANT, THCSCRSER, CANNABQUANT, OPIATESCRSER, OXYSCRSER, PROPOXSCRSER Historical Background Evaluation: [place] PDMP: Six (6) year initial data search conducted.             [place] Department of public safety, offender search: Editor, commissioning Information) Non-contributory Risk Assessment Profile: Aberrant behavior: None observed or detected today Risk factors for fatal opioid overdose: None identified today Fatal overdose hazard ratio (HR): Calculation deferred Non-fatal overdose hazard ratio (HR): Calculation deferred Risk of opioid abuse or dependence: 0.7-3.0% with doses ? 36 MME/day and 6.1-26% with doses ? 120 MME/day. Substance use disorder (SUD) risk level: Pending results of Medical Psychology Evaluation for SUD Opioid risk tool (ORT) (Total Score): 1  ORT Scoring interpretation table:  Score <3 = Low Risk for SUD  Score between 4-7 = Moderate Risk for SUD  Score >8 = High Risk for Opioid Abuse   PHQ-2 Depression Scale:  Total score: 0  PHQ-2 Scoring interpretation table: (Score and probability of major depressive disorder)  Score 0 = No depression  Score 1 = 15.4% Probability  Score 2 = 21.1% Probability  Score 3 = 38.4% Probability  Score 4 = 45.5% Probability  Score 5 = 56.4% Probability  Score 6 = 78.6% Probability   PHQ-9 Depression Scale:  Total score: 0  PHQ-9 Scoring interpretation table:  Score 0-4 = No depression  Score 5-9 = Mild depression  Score 10-14 = Moderate depression  Score 15-19 = Moderately severe depression  Score 20-27 = Severe depression (2.4 times higher risk of SUD and 2.89 times higher risk of overuse)   Pharmacologic Plan: Pending ordered  tests and/or consults  Meds  The patient has a current medication list which includes the following prescription(s): bupropion, [facility] melatonin, diazepam, diphenhydramine, fluticasone, oxycodone-acetaminophen, and triamcinolone cream.  Current Outpatient Medications on File Prior to Visit  Medication Sig  . buPROPion (WELLBUTRIN XL) 300 MG 24 hr tablet Take 1 tablet (300 mg total) by mouth every morning. Dr Bridgett Larsson (Patient taking differently: Take 150 mg by mouth every morning. Dr Toy Care)  . [facility] MELATONIN 3 MG TABS Take 1 tablet by mouth at bedtime. otc  . diazepam (VALIUM) 5 MG tablet Take 5 mg by mouth 2 (two) times daily as needed. Dr Toy Care  . diphenhydrAMINE (BENADRYL) 25 mg capsule Take 25 mg by mouth every 6 (six) hours as needed.  . fluticasone (FLONASE) 50 MCG/ACT nasal spray SPRAY 2 SPRAYS INTO EACH NOSTRIL EVERY DAY  . oxyCODONE-acetaminophen (PERCOCET) 10-325 MG per tablet Take 1 tablet by mouth every 8 (eight) hours as needed. [facility]  . triamcinolone cream (KENALOG) 0.1 % Apply 1 application topically 2 (two) times daily.   No current facility-administered medications on file prior to visit.    Imaging Review   Note: Available results from prior imaging studies were reviewed.        ROS  Cardiovascular History: No reported cardiovascular signs or symptoms such as High blood pressure, coronary artery disease, abnormal heart rate or rhythm, heart attack, blood thinner therapy or heart weakness and/or failure Pulmonary or Respiratory History: No reported pulmonary signs or symptoms such as wheezing and difficulty taking a deep full breath (Asthma), difficulty blowing air out (Emphysema), coughing up mucus (Bronchitis), persistent dry cough, or temporary stoppage of breathing during sleep Neurological History: No reported neurological signs or symptoms such as seizures, abnormal skin sensations, urinary and/or fecal incontinence, being born with an abnormal open spine and/or a  tethered spinal cord Review of Past Neurological Studies: No results found for this or any previous visit. Psychological-Psychiatric History: Anxiousness, Depressed and Prone to panicking Gastrointestinal History: No reported gastrointestinal signs or symptoms such as vomiting or evacuating blood, reflux, heartburn, alternating episodes of diarrhea and constipation, inflamed or scarred liver, or pancreas or irrregular and/or infrequent bowel movements Genitourinary History: No reported renal or genitourinary signs or symptoms such as difficulty voiding or producing urine, peeing blood, non-functioning kidney, kidney stones, difficulty emptying the bladder, difficulty controlling the flow of urine, or chronic kidney disease Hematological History: No reported hematological signs or symptoms such as prolonged bleeding, low or poor functioning platelets, bruising or bleeding easily, hereditary bleeding problems, low energy levels due to low hemoglobin or being anemic Endocrine History: No reported endocrine signs or symptoms such as high or low blood sugar, rapid heart rate due to high thyroid levels, obesity or weight gain due to slow thyroid or thyroid disease Rheumatologic History: No reported rheumatological signs and symptoms such as fatigue, joint pain, tenderness, swelling, redness, heat, stiffness, decreased range of motion, with or without associated rash Musculoskeletal History: Negative for myasthenia gravis, muscular dystrophy, multiple sclerosis or malignant hyperthermia Work History: Working full time  Allergies  Kaitlyn Ali is allergic to doxycycline and nsaids.  Laboratory Chemistry  Inflammation Markers Lab Results  Component Value Date   CRP <1 10/11/2017   ESRSEDRATE 3 10/11/2017   (CRP: Acute Phase) (ESR: Chronic Phase) Renal Function Markers  Lab Results  Component Value Date   BUN 9 10/11/2017   CREATININE 1.07 (H) 10/11/2017   GFRAA 65 10/11/2017   GFRNONAA 56 (L)  10/11/2017   Hepatic Function Markers Lab Results  Component Value Date   AST 17 10/11/2017   ALBUMIN 5.0 (H) 10/11/2017   ALKPHOS 70 10/11/2017   Electrolytes Lab Results  Component Value Date   NA 145 (H) 10/11/2017   K 5.0 10/11/2017   CL 104 10/11/2017   CALCIUM 10.3 10/11/2017   MG 2.3 10/11/2017   Neuropathy Markers Lab Results  Component Value Date   VITAMINB12 343 10/11/2017   Bone Pathology Markers Lab Results  Component Value Date   ALKPHOS 70 10/11/2017   25OHVITD1 26 (L) 10/11/2017   25OHVITD2 <1.0 10/11/2017   25OHVITD3 26 10/11/2017   CALCIUM 10.3 10/11/2017   Coagulation Parameters No results found for: INR, LABPROT, APTT, PLT Cardiovascular Markers No results found for: BNP, HGB, HCT Note: Lab results reviewed.  [place]  Drug: Kaitlyn Ali  reports that she does not use drugs. Alcohol:  reports that she drinks alcohol. Tobacco:  reports that she has quit smoking. She has never used smokeless tobacco. Medical:  has a past medical history of Anxiety, Bursitis, Depression, and Panic attack. Family: family history includes Cancer in her father and sister; Diabetes in her mother.  Past Surgical History:  Procedure Laterality Date  . LAPAROSCOPIC HYSTERECTOMY    . TOE SURGERY     shaved bone in R) great toe  . TONSILLECTOMY     Active Ambulatory Problems    Diagnosis Date Noted  . Chronic pain of both knees 10/11/2017  . Chronic right shoulder pain 10/11/2017  . Chronic pain syndrome 10/11/2017  . Long term current use of opiate analgesic 10/11/2017  . Pharmacologic therapy 10/11/2017  . Disorder of skeletal system 10/11/2017  . Problems influencing health status 10/11/2017   Resolved Ambulatory Problems    Diagnosis Date Noted  . No Resolved Ambulatory Problems   Past Medical History:  Diagnosis Date  . Anxiety   . Bursitis   . Depression   . Panic attack    Constitutional Exam  General appearance: Well nourished, well developed, and  well hydrated. In no apparent acute distress Vitals:   10/11/17 1420  BP: (!) 147/84  Pulse: 80  Resp: 16  Temp: 98.4 F (36.9 C)  SpO2: 97%  Weight: 179 lb (81.2 kg)  Height: '5\' 8"'$  (1.727 m)   BMI Assessment: Estimated body mass index is 27.22 kg/m as calculated from the following:   Height as of this encounter: '5\' 8"'$  (1.727 m).   Weight as of this encounter: 179 lb (81.2 kg).  BMI interpretation table: BMI level Category Range association with higher incidence of chronic pain  <18 kg/m2 Underweight   18.5-24.9 kg/m2 Ideal body weight   25-29.9 kg/m2 Overweight Increased incidence by 20%  30-34.9 kg/m2 Obese (Class I) Increased incidence by 68%  35-39.9 kg/m2 Severe obesity (Class II) Increased incidence by 136%  >40 kg/m2 Extreme obesity (Class III) Increased incidence by 254%   BMI Readings from Last 4 Encounters:  10/11/17 27.22 kg/m  09/04/17 28.89 kg/m  07/06/17 29.50 kg/m  02/16/17 28.74 kg/m   Wt Readings from Last 4 Encounters:  10/11/17 179 lb (81.2 kg)  09/04/17 190 lb (86.2 kg)  07/06/17 194 lb (88 kg)  02/16/17 189 lb (85.7 kg)  Psych/Mental status: Alert, oriented x 3 (person, place, & time)  Eyes: PERLA Respiratory: No evidence of acute respiratory distress  Cervical Spine Exam  Inspection: No masses, redness, or swelling Alignment: Symmetrical Functional ROM: Unrestricted ROM      Stability: No instability detected Muscle strength & Tone: Functionally intact Sensory: Unimpaired Palpation: No palpable anomalies              Upper Extremity (UE) Exam    Side: Right upper extremity  Side: Left upper extremity  Inspection: No masses, redness, swelling, or asymmetry. No contractures  Inspection: No masses, redness, swelling, or asymmetry. No contractures  Functional ROM: Adequate ROM          Functional ROM: Unrestricted ROM          Muscle strength & Tone: Functionally intact  Muscle strength & Tone: Functionally intact  Sensory: Unimpaired   Sensory: Unimpaired  Palpation: No palpable anomalies              Palpation: No palpable anomalies              Specialized Test(s): Deferred         Specialized Test(s): Deferred          Thoracic Spine Exam  Inspection: No masses, redness, or swelling Alignment: Symmetrical Functional ROM: Unrestricted ROM Stability: No instability detected Sensory: Unimpaired Muscle strength & Tone: No palpable anomalies  Lumbar Spine Exam  Inspection: No masses, redness, or swelling Alignment: Symmetrical Functional ROM: Unrestricted ROM      Stability: No instability detected Muscle strength & Tone: Functionally intact Sensory: Unimpaired Palpation: No palpable anomalies       Provocative Tests: Lumbar Hyperextension and rotation test: evaluation deferred today       Patrick's Maneuver: evaluation deferred today                    Gait & Posture Assessment  Ambulation: Unassisted Gait: Relatively normal for age and body habitus Posture: WNL   Lower Extremity Exam    Side: Right lower extremity  Side: Left lower extremity  Inspection: No masses, redness, swelling, or asymmetry. No contractures  Inspection: No masses, redness, swelling, or asymmetry. No contractures  Functional ROM: Adequate ROM          Functional ROM: Adequate ROM          Muscle strength & Tone: Functionally intact  Muscle strength & Tone: Functionally intact  Sensory: Unimpaired  Sensory: Unimpaired  Palpation: No palpable anomalies  Palpation: No palpable anomalies   Assessment  Primary Diagnosis & Pertinent Problem List: Diagnoses of Chronic pain of both knees, Chronic right shoulder pain, Chronic pain syndrome, Long term current use of opiate analgesic, Pharmacologic therapy, Disorder of skeletal system, and Problems influencing health status were pertinent to this visit.  Visit Diagnosis: 1. Chronic pain of both knees   2. Chronic right shoulder pain   3. Chronic pain syndrome   4. Long term current use of  opiate analgesic   5. Pharmacologic therapy   6. Disorder of skeletal system   7. Problems influencing health status    Plan of Care  Initial treatment plan:  Please be advised that as per protocol, today's visit has been an evaluation only. We have not taken over the patient's controlled substance management.  Problem-specific plan: No problem-specific Assessment & Plan notes found for this encounter.  Ordered Lab-work, Procedure(s), Referral(s), & Consult(s): Orders Placed This Encounter  Procedures  . Compliance Drug Analysis, Ur  . Comp. Metabolic Panel (12)  . Magnesium  .  Vitamin B12  . Sedimentation rate  . 25-Hydroxyvitamin D Lcms D2+D3  . C-reactive protein   Pharmacotherapy: Medications ordered:  No orders of the defined types were placed in this encounter.  Medications administered during this visit: Kaitlyn Ali had no medications administered during this visit.   Pharmacotherapy under consideration:  Opioid Analgesics: The patient was informed that there is no guarantee that she would be a candidate for opioid analgesics. The decision will be made following CDC guidelines. This decision will be based on the results of diagnostic studies, as well as Kaitlyn Ali risk profile.  Membrane stabilizer: To be determined at a later time Muscle relaxant: To be determined at a later time NSAID: To be determined at a later time Other analgesic(s): To be determined at a later time   Interventional therapies under consideration: Kaitlyn Ali was informed that there is no guarantee that she would be a candidate for interventional therapies. The decision will be based on the results of diagnostic studies, as well as Kaitlyn Ali risk profile.  Possible procedure(s): Diagnostic right articular shoulder injection Diagnostic bilateral intra-articular knee injections Diagnostic bilateral Hyalgan series   Provider-requested follow-up: Return for 2nd Visit, w/ Dr.  Dossie Arbour.  Future Appointments  Date Time Provider [place]  10/25/2017  8:30 AM Vevelyn Francois, NP [facility] None    Primary Care Physician: Juline Patch, MD Location: [facility] [place] [facility] Note by:  Date: 10/11/2017; Time: 4:12 PM  Pain Score Disclaimer: We use the NRS-11 scale. This is a self-reported, subjective measurement of pain severity with only modest accuracy. It is used primarily to identify changes within a particular patient. It must be understood that outpatient pain scales are significantly less accurate that those used for research, where they can be applied under ideal controlled circumstances with minimal exposure to variables. In reality, the score is likely to be a combination of pain intensity and pain affect, where pain affect describes the degree of emotional arousal or changes in action readiness caused by the sensory experience of pain. Factors such as social and work situation, setting, emotional state, anxiety levels, expectation, and prior pain experience may influence pain perception and show large inter-individual differences that may also be affected by time variables.  Patient instructions provided during this appointment: Patient Instructions   ____________________________________________________________________________________________  Appointment Policy Summary  It is our goal and responsibility to provide the medical community with assistance in the evaluation and management of patients with chronic pain. Unfortunately our resources are limited. Because we do not have an unlimited amount of time, or available appointments, we are required to closely monitor and manage their use. The following rules exist to maximize their use:  Patient's responsibilities: 1. Punctuality:  At what time should I arrive? You should be physically present in our office 30 minutes before your scheduled appointment. Your scheduled appointment is  with your assigned healthcare provider. However, it takes 5-10 minutes to be "checked-in", and another 15 minutes for the nurses to do the admission. If you arrive to our office at the time you were given for your appointment, you will end up being at least 20-25 minutes late to your appointment with the provider. 2. Tardiness:  What happens if I arrive only a few minutes after my scheduled appointment time? You will need to reschedule your appointment. The cutoff is your appointment time. This is why it is so important that you arrive at least 30 minutes before that appointment. If you have an  appointment scheduled for 10:00 AM and you arrive at 10:01, you will be required to reschedule your appointment.  3. Plan ahead:  Always assume that you will encounter traffic on your way in. Plan for it. If you are dependent on a driver, make sure they understand these rules and the need to arrive early. 4. Other appointments and responsibilities:  Avoid scheduling any other appointments before or after your pain clinic appointments.  5. Be prepared:  Write down everything that you need to discuss with your healthcare provider and give this information to the admitting nurse. Write down the medications that you will need refilled. Bring your pills and bottles (even the empty ones), to all of your appointments, except for those where a procedure is scheduled. 6. No children or pets:  Find someone to take care of them. It is not appropriate to bring them in. 7. Scheduling changes:  We request "advanced notification" of any changes or cancellations. 8. Advanced notification:  Defined as a time period of more than 24 hours prior to the originally scheduled appointment. This allows for the appointment to be offered to other patients. 9. Rescheduling:  When a visit is rescheduled, it will require the cancellation of the original appointment. For this reason they both fall within the category of "Cancellations".   10. Cancellations:  They require advanced notification. Any cancellation less than 24 hours before the  appointment will be recorded as a "No Show". 11. No Show:  Defined as an unkept appointment where the patient failed to notify or declare to the practice their intention or inability to keep the appointment.  Corrective process for repeat offenders:  1. Tardiness: Three (3) episodes of rescheduling due to late arrivals will be recorded as one (1) "No Show". 2. Cancellation or reschedule: Three (3) cancellations or rescheduling will be recorded as one (1) "No Show". 3. "No Shows": Three (3) "No Shows" within a 12 month period will result in discharge from the practice. ____________________________________________________________________________________________  ____________________________________________________________________________________________  Pain Scale  Introduction: The pain score used by this practice is the Verbal Numerical Rating Scale (VNRS-11). This is an 11-point scale. It is for adults and children 10 years or older. There are significant differences in how the pain score is reported, used, and applied. Forget everything you learned in the past and learn this scoring system.  General Information: The scale should reflect your current level of pain. Unless you are specifically asked for the level of your worst pain, or your average pain. If you are asked for one of these two, then it should be understood that it is over the past 24 hours.  Basic Activities of Daily Living (ADL): Personal hygiene, dressing, eating, transferring, and using restroom.  Instructions: Most patients tend to report their level of pain as a combination of two factors, their physical pain and their psychosocial pain. This last one is also known as "suffering" and it is reflection of how physical pain affects you socially and psychologically. From now on, report them separately. From this point on,  when asked to report your pain level, report only your physical pain. Use the following table for reference.  Pain Clinic Pain Levels (0-5/10)  Pain Level Score  Description  No Pain 0   Mild pain 1 Nagging, annoying, but does not interfere with basic activities of daily living (ADL). Patients are able to eat, bathe, get dressed, toileting (being able to get on and off the toilet and perform personal hygiene functions), transfer (move  in and out of bed or a chair without assistance), and maintain continence (able to control bladder and bowel functions). Blood pressure and heart rate are unaffected. A normal heart rate for a healthy adult ranges from 60 to 100 bpm (beats per minute).   Mild to moderate pain 2 Noticeable and distracting. Impossible to hide from other people. More frequent flare-ups. Still possible to adapt and function close to normal. It can be very annoying and may have occasional stronger flare-ups. With discipline, patients may get used to it and adapt.   Moderate pain 3 Interferes significantly with activities of daily living (ADL). It becomes difficult to feed, bathe, get dressed, get on and off the toilet or to perform personal hygiene functions. Difficult to get in and out of bed or a chair without assistance. Very distracting. With effort, it can be ignored when deeply involved in activities.   Moderately severe pain 4 Impossible to ignore for more than a few minutes. With effort, patients may still be able to manage work or participate in some social activities. Very difficult to concentrate. Signs of autonomic nervous system discharge are evident: dilated pupils (mydriasis); mild sweating (diaphoresis); sleep interference. Heart rate becomes elevated (>115 bpm). Diastolic blood pressure (lower number) rises above 100 mmHg. Patients find relief in laying down and not moving.   Severe pain 5 Intense and extremely unpleasant. Associated with frowning face and frequent crying.  Pain overwhelms the senses.  Ability to do any activity or maintain social relationships becomes significantly limited. Conversation becomes difficult. Pacing back and forth is common, as getting into a comfortable position is nearly impossible. Pain wakes you up from deep sleep. Physical signs will be obvious: pupillary dilation; increased sweating; goosebumps; brisk reflexes; cold, clammy hands and feet; nausea, vomiting or dry heaves; loss of appetite; significant sleep disturbance with inability to fall asleep or to remain asleep. When persistent, significant weight loss is observed due to the complete loss of appetite and sleep deprivation.  Blood pressure and heart rate becomes significantly elevated. Caution: If elevated blood pressure triggers a pounding headache associated with blurred vision, then the patient should immediately seek attention at an urgent or emergency care unit, as these may be signs of an impending stroke.    Emergency Department Pain Levels (6-10/10)  Emergency Room Pain 6 Severely limiting. Requires emergency care and should not be seen or managed at an outpatient pain management facility. Communication becomes difficult and requires great effort. Assistance to reach the emergency department may be required. Facial flushing and profuse sweating along with potentially dangerous increases in heart rate and blood pressure will be evident.   Distressing pain 7 Self-care is very difficult. Assistance is required to transport, or use restroom. Assistance to reach the emergency department will be required. Tasks requiring coordination, such as bathing and getting dressed become very difficult.   Disabling pain 8 Self-care is no longer possible. At this level, pain is disabling. The individual is unable to do even the most "basic" activities such as walking, eating, bathing, dressing, transferring to a bed, or toileting. Fine motor skills are lost. It is difficult to think clearly.    Incapacitating pain 9 Pain becomes incapacitating. Thought processing is no longer possible. Difficult to remember your own name. Control of movement and coordination are lost.   The worst pain imaginable 10 At this level, most patients pass out from pain. When this level is reached, collapse of the autonomic nervous system occurs, leading to a sudden drop  in blood pressure and heart rate. This in turn results in a temporary and dramatic drop in blood flow to the brain, leading to a loss of consciousness. Fainting is one of the body's self defense mechanisms. Passing out puts the brain in a calmed state and causes it to shut down for a while, in order to begin the healing process.    Summary: 1. Refer to this scale when providing [place] with your pain level. 2. Be accurate and careful when reporting your pain level. This will help with your care. 3. Over-reporting your pain level will lead to loss of credibility. 4. Even a level of 1/10 means that there is pain and will be treated at our facility. 5. High, inaccurate reporting will be documented as "Symptom Exaggeration", leading to loss of credibility and suspicions of possible secondary gains such as obtaining more narcotics, or wanting to appear disabled, for fraudulent reasons. 6. Only pain levels of 5 or below will be seen at our facility. 7. Pain levels of 6 and above will be sent to the Emergency Department and the appointment cancelled. ____________________________________________________________________________________________

## 2017-10-11 NOTE — Progress Notes (Signed)
Safety precautions to be maintained throughout the outpatient stay will include: orient to surroundings, keep bed in low position, maintain call bell within reach at all times, provide assistance with transfer out of bed and ambulation.  

## 2017-10-15 LAB — COMP. METABOLIC PANEL (12)
ALBUMIN: 5 g/dL — AB (ref 3.6–4.8)
ALK PHOS: 70 IU/L (ref 39–117)
AST: 17 IU/L (ref 0–40)
Albumin/Globulin Ratio: 2 (ref 1.2–2.2)
BUN / CREAT RATIO: 8 — AB (ref 12–28)
BUN: 9 mg/dL (ref 8–27)
Bilirubin Total: 0.4 mg/dL (ref 0.0–1.2)
CREATININE: 1.07 mg/dL — AB (ref 0.57–1.00)
Calcium: 10.3 mg/dL (ref 8.7–10.3)
Chloride: 104 mmol/L (ref 96–106)
GFR calc Af Amer: 65 mL/min/{1.73_m2} (ref >59)
GFR, EST NON AFRICAN AMERICAN: 56 mL/min/{1.73_m2} — AB (ref >59)
GLUCOSE: 102 mg/dL — AB (ref 65–99)
Globulin, Total: 2.5 g/dL (ref 1.5–4.5)
POTASSIUM: 5 mmol/L (ref 3.5–5.2)
Sodium: 145 mmol/L — ABNORMAL HIGH (ref 134–144)
Total Protein: 7.5 g/dL (ref 6.0–8.5)

## 2017-10-15 LAB — 25-HYDROXYVITAMIN D LCMS D2+D3: 25-HYDROXY, VITAMIN D: 26 ng/mL — AB

## 2017-10-15 LAB — 25-HYDROXY VITAMIN D LCMS D2+D3
25-Hydroxy, Vitamin D-2: <1 ng/mL
25-Hydroxy, Vitamin D-3: 26 ng/mL

## 2017-10-15 LAB — MAGNESIUM: Magnesium: 2.3 mg/dL (ref 1.6–2.3)

## 2017-10-15 LAB — SEDIMENTATION RATE: Sed Rate: 3 mm/hr (ref 0–40)

## 2017-10-15 LAB — VITAMIN B12: VITAMIN B 12: 343 pg/mL (ref 232–1245)

## 2017-10-15 LAB — C-REACTIVE PROTEIN: CRP: <1 mg/L (ref 0–10)

## 2017-10-16 LAB — COMPLIANCE DRUG ANALYSIS, UR

## 2017-10-24 NOTE — Progress Notes (Signed)
Patient's Name: Kaitlyn Ali  MRN: [id number]  Referring Provider: Juline Patch, MD  DOB: 02-13-1956  PCP: Juline Patch, MD  DOS: 10/25/2017  Note by: Gaspar Cola, MD  Service setting: Ambulatory outpatient  Specialty: Interventional Pain Management  Location: [facility] (AMB) Pain Management Facility    Patient type: Established   Primary Reason(s) for Visit: Encounter for evaluation before starting new chronic pain management plan of care (Level of risk: moderate) CC: Knee Pain (left) and Shoulder Pain (right)  HPI  Ms. Ali is a 62 y.o. year old, female patient, who comes today for a follow-up evaluation to review the test results and decide on a treatment plan. She has Chronic pain of both knees (Secondary Area of Pain) (L>R); Chronic right shoulder pain (Primary Area of Pain); Chronic pain syndrome; Long term current use of opiate analgesic; Pharmacologic therapy; Disorder of skeletal system; and Problems influencing health status on their problem list. Her primarily concern today is the Knee Pain (left) and Shoulder Pain (right)  Pain Assessment: Location: Left (S) Knee(right shoulder) Radiating: denies Onset: More than a month ago Duration: Chronic pain Quality: Aching, Constant Severity: 1 /10 (subjective, self-reported pain score)  Note: Reported level is compatible with observation.                         When using our objective Pain Scale, levels between 6 and 10/10 are said to belong in an emergency room, as it progressively worsens from a 6/10, described as severely limiting, requiring emergency care not usually available at an outpatient pain management facility. At a 6/10 level, communication becomes difficult and requires great effort. Assistance to reach the emergency department may be required. Facial flushing and profuse sweating along with potentially dangerous increases in heart rate and blood pressure will be evident. Effect on ADL: "I have difficulty walking  and standing alot" Timing: Constant Modifying factors: medications, oils, yoga BP: (!) 155/84  HR: 71  Ms. Ali comes in today for a follow-up visit after her initial evaluation on 10/11/2017. Today we went over the results of her tests. These were explained in "Layman's terms". During today's appointment we went over my diagnostic impression, as well as the proposed treatment plan.  Her primary area of pain is in her knees.  She admits the left is greater than the right (L>R). Treated by Liberty Handy ([facility]). He admits that she has swelling worse with walking.  Denies any previous surgery, interventional therapy or recent physical therapy.  Accordingto the patient her secondary area of pain is in her right shoulder.  He denies any precipitating factors.  He denies any previous surgery, interventional therapy. She admits that she uses heat ice and yoga stretches.  He admits that she was patient [facility] however there are no longer taking her insurance. 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  In considering the treatment plan options, Ms. Ali was reminded that I no longer take patients for medication management only. I asked her to let me know if she had no intention of taking advantage of the interventional therapies, so that we could make arrangements to provide this space to someone interested. I also made it clear that undergoing interventional therapies for the purpose of getting pain medications is very inappropriate on the part of a patient, and it will not be tolerated in this practice. This  type of behavior would suggest true addiction and therefore it requires referral to an addiction specialist.   Further details on both, my assessment(s), as well as the proposed treatment plan, please see below.  Controlled Substance Pharmacotherapy Assessment REMS (Risk Evaluation and Mitigation  Strategy)  Analgesic: oxycodone/acetaminophen 10/325 mg Highest recorded MME/day: 87m/day MME/day: 45 mg/day Pill Count: None expected due to no prior prescriptions written by our practice. TDewayne Shorter RN  10/25/2017  8:28 AM  Signed Nursing Pain Medication Assessment:  Safety precautions to be maintained throughout the outpatient stay will include: orient to surroundings, keep bed in low position, maintain call bell within reach at all times, provide assistance with transfer out of bed and ambulation.  Medication Inspection Compliance: Pill count conducted under aseptic conditions, in front of the patient. Neither the pills nor the bottle was removed from the patient's sight at any time. Once count was completed pills were immediately returned to the patient in their original bottle.  Medication: Oxycodone/APAP Pill/Patch Count: 76 of 90 pills remain Pill/Patch Appearance: Markings consistent with prescribed medication Bottle Appearance: Standard pharmacy container. Clearly labeled. Filled Date: 07 / 19 / 2019 Last Medication [facility]:  [facility]   Pharmacokinetics: Liberation and absorption (onset of action): WNL Distribution (time to peak effect): WNL Metabolism and excretion (duration of action): WNL         Pharmacodynamics: Desired effects: Analgesia: Ms. JMartiniquereports >50% benefit. Functional ability: Patient reports that medication allows her to accomplish basic ADLs Clinically meaningful improvement in function (CMIF): Sustained CMIF goals met Perceived effectiveness: Described as relatively effective, allowing for increase in activities of daily living (ADL) Undesirable effects: Side-effects or Adverse reactions: None reported Monitoring: [place]MP: Online review of the past 118-montheriod previously conducted. Not applicable at this point since we have not taken over the patient's medication management yet. List of other Serum/Urine Drug Screening Test(s):  No  results found. List of all UDS test(s) done:  Lab Results  Component Value Date   SUMMARY FINAL 10/11/2017   Last UDS on record: Summary  Date Value Ref Range Status  10/11/2017 FINAL  Final    Comment:    ==================================================================== TOXASSURE COMP DRUG ANALYSIS,UR ==================================================================== Test                             Result       Flag       Units Drug Present and Declared for Prescription Verification   Desmethyldiazepam              291          EXPECTED   ng/mg creat   Oxazepam                       1425         EXPECTED   ng/mg creat   Temazepam                      672          EXPECTED   ng/mg creat    Desmethyldiazepam, oxazepam, and temazepam are expected    metabolites of diazepam. Desmethyldiazepam and oxazepam are also    expected metabolites of other drugs, including chlordiazepoxide,    prazepam, clorazepate, and halazepam. Oxazepam is an expected    metabolite of temazepam. Oxazepam and temazepam are also    available as scheduled  prescription medications.   Oxycodone                      950          EXPECTED   ng/mg creat   Oxymorphone                    775          EXPECTED   ng/mg creat   Noroxycodone                   4656         EXPECTED   ng/mg creat   Noroxymorphone                 244          EXPECTED   ng/mg creat    Sources of oxycodone are scheduled prescription medications.    Oxymorphone, noroxycodone, and noroxymorphone are expected    metabolites of oxycodone. Oxymorphone is also available as a    scheduled prescription medication.   Hydroxybupropion               PRESENT      EXPECTED    Hydroxybupropion is an expected metabolite of bupropion.   Acetaminophen                  PRESENT      EXPECTED Drug Absent but Declared for Prescription Verification   Diphenhydramine                Not Detected  UNEXPECTED ==================================================================== Test                      Result    Flag   Units      Ref Range   Creatinine              32               mg/dL      >=20 ==================================================================== Declared Medications:  The flagging and interpretation on this report are based on the  following declared medications.  Unexpected results may arise from  inaccuracies in the declared medications.  **Note: The testing scope of this panel includes these medications:  Bupropion  Diazepam  Diphenhydramine  Oxycodone (Oxycodone Acetaminophen)  **Note: The testing scope of this panel does not include small to  moderate amounts of these reported medications:  Acetaminophen (Oxycodone Acetaminophen)  **Note: The testing scope of this panel does not include following  reported medications:  Fluticasone  Melatonin  Triamcinolone acetonide ==================================================================== For clinical consultation, please call [phone]. ====================================================================    UDS interpretation: No unexpected findings. Patient informed of the CDC guidelines and recommendations to stay away from the concomitant use of benzodiazepines and opioids due to the increased risk of respiratory depression and death. Medication Assessment Form: Not applicable. Treatment compliance: Not applicable Risk Assessment Profile: Aberrant behavior: claims that "nothing else works" and inability to consider abstinence Comorbid factors increasing risk of overdose: Benzodiazepine use and concomitant use of Benzodiazepines Medical Psychology Evaluation: Moderate Risk Opioid Risk Tool - 10/11/17 1431      Family History of Substance Abuse   Alcohol  Negative    Illegal Drugs  Negative    Rx Drugs  Negative      Personal History of Substance Abuse   Alcohol  Negative    Illegal Drugs   Negative    Rx Drugs  Negative      Age   Age between 38-45 years   No      History of Preadolescent Sexual Abuse   History of Preadolescent Sexual Abuse  Negative or Female      Psychological Disease   Psychological Disease  Negative    Depression  Positive      Total Score   Opioid Risk Tool Scoring  1    Opioid Risk Interpretation  Low Risk      ORT Scoring interpretation table:  Score <3 = Low Risk for SUD  Score between 4-7 = Moderate Risk for SUD  Score >8 = High Risk for Opioid Abuse   Risk Mitigation Strategies:  Patient opioid safety counseling: Not applicable. Patient-Prescriber Agreement (PPA): No agreement signed.  Controlled substance notification to other providers: None required. No opioid therapy.  Pharmacologic Plan: No objective evidence of pathology justifying the use of opioid analgesics has been confirmed. Therefore, at this time, I cannot recommend the use of these controlled substances.             Laboratory Chemistry  Inflammation Markers (CRP: Acute Phase) (ESR: Chronic Phase) Lab Results  Component Value Date   CRP <1 10/11/2017   ESRSEDRATE 3 10/11/2017                         Rheumatology Markers No results found.  Renal Function Markers Lab Results  Component Value Date   BUN 9 10/11/2017   CREATININE 1.07 (H) 10/11/2017   BCR 8 (L) 10/11/2017   GFRAA 65 10/11/2017   GFRNONAA 56 (L) 10/11/2017                             Hepatic Function Markers Lab Results  Component Value Date   AST 17 10/11/2017   ALBUMIN 5.0 (H) 10/11/2017   ALKPHOS 70 10/11/2017                        Electrolytes Lab Results  Component Value Date   NA 145 (H) 10/11/2017   K 5.0 10/11/2017   CL 104 10/11/2017   CALCIUM 10.3 10/11/2017   MG 2.3 10/11/2017   PHOS 3.5 02/16/2017                        Neuropathy Markers Lab Results  Component Value Date   VITAMINB12 343 10/11/2017                        Bone Pathology Markers Lab Results   Component Value Date   25OHVITD1 26 (L) 10/11/2017   25OHVITD2 <1.0 10/11/2017   25OHVITD3 26 10/11/2017  NOTE: Vitamin D deficiency/insufficiency, among other things, has been associated with chronic joint pain.  Coagulation Parameters No results found.  Cardiovascular Markers No results found.  CA Markers No results found.  Note: Lab results reviewed.  Recent Diagnostic Imaging Review   Complexity Note: No results found under the [facility] electronic medical record.                         Meds   Current Outpatient Medications:  .  buPROPion (WELLBUTRIN XL) 300 MG 24 hr tablet, Take 1 tablet (300 mg total) by mouth every morning. Dr Bridgett Larsson (Patient taking differently: Take 150 mg by mouth every morning.  Dr Toy Care), Disp: 30 tablet, Rfl: 0 .  [facility] MELATONIN 3 MG TABS, Take 1 tablet by mouth at bedtime. otc, Disp: , Rfl: 1 .  diazepam (VALIUM) 5 MG tablet, Take 5 mg by mouth 2 (two) times daily as needed. Dr Toy Care, Disp: , Rfl: 1 .  diphenhydrAMINE (BENADRYL) 25 mg capsule, Take 25 mg by mouth every 6 (six) hours as needed., Disp: , Rfl:  .  fluticasone (FLONASE) 50 MCG/ACT nasal spray, SPRAY 2 SPRAYS INTO EACH NOSTRIL EVERY DAY, Disp: 16 g, Rfl: 11 .  oxyCODONE-acetaminophen (PERCOCET) 10-325 MG per tablet, Take 1 tablet by mouth every 8 (eight) hours as needed. [facility], Disp: , Rfl: 0 .  triamcinolone cream (KENALOG) 0.1 %, Apply 1 application topically 2 (two) times daily., Disp: 60 g, Rfl: 0  ROS  Constitutional: Denies any fever or chills Gastrointestinal: No reported hemesis, hematochezia, vomiting, or acute GI distress Musculoskeletal: Denies any acute onset joint swelling, redness, loss of ROM, or weakness Neurological: No reported episodes of acute onset apraxia, aphasia, dysarthria, agnosia, amnesia, paralysis, loss of coordination, or loss of consciousness  Allergies  Ms. Ali is allergic to doxycycline and nsaids.  [address]  Drug: Ms. Ali   reports that she does not use drugs. Alcohol:  reports that she drinks alcohol. Tobacco:  reports that she has quit smoking. She has never used smokeless tobacco. Medical:  has a past medical history of Anxiety, Bursitis, Depression, and Panic attack. Surgical: Ms. Ali  has a past surgical history that includes Toe Surgery; Tonsillectomy; and Laparoscopic hysterectomy. Family: family history includes Cancer in her father and sister; Diabetes in her mother.  Constitutional Exam  General appearance: Well nourished, well developed, and well hydrated. In no apparent acute distress Vitals:   10/25/17 0820  BP: (!) 155/84  Pulse: 71  Resp: 16  Temp: 97.8 F (36.6 C)  SpO2: 100%  Weight: 177 lb (80.3 kg)  Height: '5\' 8"'  (1.727 m)   BMI Assessment: Estimated body mass index is 26.91 kg/m as calculated from the following:   Height as of this encounter: '5\' 8"'  (1.727 m).   Weight as of this encounter: 177 lb (80.3 kg).  BMI interpretation table: BMI level Category Range association with higher incidence of chronic pain  <18 kg/m2 Underweight   18.5-24.9 kg/m2 Ideal body weight   25-29.9 kg/m2 Overweight Increased incidence by 20%  30-34.9 kg/m2 Obese (Class I) Increased incidence by 68%  35-39.9 kg/m2 Severe obesity (Class II) Increased incidence by 136%  >40 kg/m2 Extreme obesity (Class III) Increased incidence by 254%   Patient's current BMI Ideal Body weight  Body mass index is 26.91 kg/m. Ideal body weight: 63.9 kg (140 lb 14 oz) Adjusted ideal body weight: 70.5 kg (155 lb 5.2 oz)   BMI Readings from Last 4 Encounters:  10/25/17 26.91 kg/m  10/11/17 27.22 kg/m  09/04/17 28.89 kg/m  07/06/17 29.50 kg/m   Wt Readings from Last 4 Encounters:  10/25/17 177 lb (80.3 kg)  10/11/17 179 lb (81.2 kg)  09/04/17 190 lb (86.2 kg)  07/06/17 194 lb (88 kg)  Psych/Mental status: Alert, oriented x 3 (person, place, & time)       Eyes: PERLA Respiratory: No evidence of acute  respiratory distress  Cervical Spine Area Exam  Skin & Axial Inspection: No masses, redness, edema, swelling, or associated skin lesions Alignment: Symmetrical Functional ROM: Unrestricted ROM      Stability: No instability detected Muscle Tone/Strength: Functionally intact. No obvious neuro-muscular  anomalies detected. Sensory (Neurological): Unimpaired Palpation: No palpable anomalies              Upper Extremity (UE) Exam    Side: Right upper extremity  Side: Left upper extremity  Skin & Extremity Inspection: Skin color, temperature, and hair growth are WNL. No peripheral edema or cyanosis. No masses, redness, swelling, asymmetry, or associated skin lesions. No contractures.  Skin & Extremity Inspection: Skin color, temperature, and hair growth are WNL. No peripheral edema or cyanosis. No masses, redness, swelling, asymmetry, or associated skin lesions. No contractures.  Functional ROM: Unrestricted ROM          Functional ROM: Unrestricted ROM          Muscle Tone/Strength: Functionally intact. No obvious neuro-muscular anomalies detected.  Muscle Tone/Strength: Functionally intact. No obvious neuro-muscular anomalies detected.  Sensory (Neurological): Unimpaired          Sensory (Neurological): Unimpaired          Palpation: No palpable anomalies              Palpation: No palpable anomalies              Provocative Test(s):  Phalen's test: deferred Tinel's test: deferred Apley's scratch test (touch opposite shoulder):  Action 1 (Across chest): deferred Action 2 (Overhead): deferred Action 3 (LB reach): deferred   Provocative Test(s):  Phalen's test: deferred Tinel's test: deferred Apley's scratch test (touch opposite shoulder):  Action 1 (Across chest): deferred Action 2 (Overhead): deferred Action 3 (LB reach): deferred    Thoracic Spine Area Exam  Skin & Axial Inspection: No masses, redness, or swelling Alignment: Symmetrical Functional ROM: Unrestricted ROM Stability:  No instability detected Muscle Tone/Strength: Functionally intact. No obvious neuro-muscular anomalies detected. Sensory (Neurological): Unimpaired Muscle strength & Tone: No palpable anomalies  Lumbar Spine Area Exam  Skin & Axial Inspection: No masses, redness, or swelling Alignment: Symmetrical Functional ROM: Unrestricted ROM       Stability: No instability detected Muscle Tone/Strength: Functionally intact. No obvious neuro-muscular anomalies detected. Sensory (Neurological): Unimpaired Palpation: No palpable anomalies       Provocative Tests: Lumbar Hyperextension/rotation test: deferred today       Lumbar quadrant test (Kemp's test): deferred today       Lumbar Lateral bending test: deferred today       Patrick's Maneuver: deferred today                   FABER test: deferred today                   Thigh-thrust test: deferred today       S-I compression test: deferred today       S-I distraction test: deferred today        Gait & Posture Assessment  Ambulation: Unassisted Gait: Relatively normal for age and body habitus Posture: WNL   Lower Extremity Exam    Side: Right lower extremity  Side: Left lower extremity  Stability: No instability observed          Stability: No instability observed          Skin & Extremity Inspection: Skin color, temperature, and hair growth are WNL. No peripheral edema or cyanosis. No masses, redness, swelling, asymmetry, or associated skin lesions. No contractures.  Skin & Extremity Inspection: Skin color, temperature, and hair growth are WNL. No peripheral edema or cyanosis. No masses, redness, swelling, asymmetry, or associated skin lesions. No contractures.  Functional ROM: Unrestricted  ROM                  Functional ROM: Unrestricted ROM                  Muscle Tone/Strength: Functionally intact. No obvious neuro-muscular anomalies detected.  Muscle Tone/Strength: Functionally intact. No obvious neuro-muscular anomalies detected.  Sensory  (Neurological): Unimpaired  Sensory (Neurological): Unimpaired  Palpation: No palpable anomalies  Palpation: No palpable anomalies   Assessment & Plan  Primary Diagnosis & Pertinent Problem List: The primary encounter diagnosis was Chronic pain syndrome. Diagnoses of Chronic right shoulder pain (Primary Area of Pain) and Chronic pain of both knees (Secondary Area of Pain) (L>R) were also pertinent to this visit.  Visit Diagnosis: 1. Chronic pain syndrome   2. Chronic right shoulder pain (Primary Area of Pain)   3. Chronic pain of both knees (Secondary Area of Pain) (L>R)    Problems updated and reviewed during this visit: No problems updated.  Plan of Care  Pharmacotherapy (Medications Ordered): No orders of the defined types were placed in this encounter.  Procedure Orders    No procedure(s) ordered today   Lab Orders  No laboratory test(s) ordered today   Imaging Orders  No imaging studies ordered today   Referral Orders  No referral(s) requested today    Pharmacological management options:  Opioid Analgesics: I will not be prescribing any opioids at this time Membrane stabilizer: I will not be prescribing any at this time Muscle relaxant: I will not be prescribing any at this time NSAID: I will not be prescribing any at this time Other analgesic(s): I will not be prescribing any at this time   Interventional management options: Planned, scheduled, and/or pending:     horrible needle phobia and is not interested in any type of interventional therapy   Considering:   Diagnostic right articular shoulder injection  Diagnostic right suprascapular nerve block  Possible right suprascapular nerve RFA  Diagnostic bilateral intra-articular knee injections  Diagnostic bilateral Hyalgan series  Diagnostic bilateral genicular nerve blocks  Possible bilateral genicular nerve RFA    PRN Procedures:   None at this time   Provider-requested follow-up: No follow-ups on  file.  No future appointments.  Primary Care Physician: Juline Patch, MD Location: [facility] Note by: Gaspar Cola, MD Date: 10/25/2017; Time: 9:42 AM

## 2017-10-25 ENCOUNTER — Encounter: Payer: Self-pay | Admitting: Pain Medicine

## 2017-10-25 ENCOUNTER — Ambulatory Visit: Payer: BLUE CROSS/BLUE SHIELD | Admitting: Nurse Practitioner

## 2017-10-25 ENCOUNTER — Ambulatory Visit: Payer: BLUE CROSS/BLUE SHIELD | Attending: Nurse Practitioner | Admitting: Pain Medicine

## 2017-10-25 ENCOUNTER — Other Ambulatory Visit: Payer: Self-pay

## 2017-10-25 VITALS — BP 155/84 | HR 71 | Temp 97.8°F | Resp 16 | Ht 68.0 in | Wt 177.0 lb

## 2017-10-25 DIAGNOSIS — M25561 Pain in right knee: Secondary | ICD-10-CM | POA: Insufficient documentation

## 2017-10-25 DIAGNOSIS — G8929 Other chronic pain: Secondary | ICD-10-CM

## 2017-10-25 DIAGNOSIS — Z79891 Long term (current) use of opiate analgesic: Secondary | ICD-10-CM | POA: Insufficient documentation

## 2017-10-25 DIAGNOSIS — G894 Chronic pain syndrome: Secondary | ICD-10-CM | POA: Insufficient documentation

## 2017-10-25 DIAGNOSIS — M25562 Pain in left knee: Secondary | ICD-10-CM | POA: Insufficient documentation

## 2017-10-25 DIAGNOSIS — M25511 Pain in right shoulder: Secondary | ICD-10-CM | POA: Insufficient documentation

## 2017-10-25 DIAGNOSIS — Z79899 Other long term (current) drug therapy: Secondary | ICD-10-CM | POA: Insufficient documentation

## 2017-10-25 NOTE — Progress Notes (Signed)
Nursing Pain Medication Assessment:  Safety precautions to be maintained throughout the outpatient stay will include: orient to surroundings, keep bed in low position, maintain call bell within reach at all times, provide assistance with transfer out of bed and ambulation.  Medication Inspection Compliance: Pill count conducted under aseptic conditions, in front of the patient. Neither the pills nor the bottle was removed from the patient's sight at any time. Once count was completed pills were immediately returned to the patient in their original bottle.  Medication: Oxycodone/APAP Pill/Patch Count: 76 of 90 pills remain Pill/Patch Appearance: Markings consistent with prescribed medication Bottle Appearance: Standard pharmacy container. Clearly labeled. Filled Date: 07 / 19 / 2019 Last Medication intake:  Today  From [facility]

## 2017-11-21 ENCOUNTER — Encounter: Payer: Self-pay | Admitting: Family Medicine

## 2017-11-21 ENCOUNTER — Ambulatory Visit (INDEPENDENT_AMBULATORY_CARE_PROVIDER_SITE_OTHER): Payer: BLUE CROSS/BLUE SHIELD | Admitting: Family Medicine

## 2017-11-21 VITALS — BP 120/70 | HR 70 | Ht 68.0 in | Wt 176.0 lb

## 2017-11-21 DIAGNOSIS — N342 Other urethritis: Secondary | ICD-10-CM | POA: Diagnosis not present

## 2017-11-21 DIAGNOSIS — S336XXA Sprain of sacroiliac joint, initial encounter: Secondary | ICD-10-CM

## 2017-11-21 DIAGNOSIS — N318 Other neuromuscular dysfunction of bladder: Secondary | ICD-10-CM

## 2017-11-21 LAB — POCT URINALYSIS DIPSTICK
Bilirubin, UA: NEGATIVE
Blood, UA: NEGATIVE
GLUCOSE UA: NEGATIVE
Ketones, UA: NEGATIVE
NITRITE UA: NEGATIVE
PROTEIN UA: NEGATIVE
SPEC GRAV UA: 1.01 (ref 1.010–1.025)
Urobilinogen, UA: 0.2 E.U./dL
pH, UA: 6 (ref 5.0–8.0)

## 2017-11-21 MED ORDER — NITROFURANTOIN MONOHYD MACRO 100 MG PO CAPS: 100.0000 mg | ORAL_CAPSULE | Freq: Two times a day (BID) | ORAL | 0 refills | Status: DC

## 2017-11-21 NOTE — Patient Instructions (Signed)
Sacroiliac Joint Dysfunction Sacroiliac joint dysfunction is a condition that causes inflammation on one or both sides of the sacroiliac (SI) joint. The SI joint connects the lower part of the spine (sacrum) with the two upper portions of the pelvis (ilium). This condition causes deep aching or burning pain in the low back. In some cases, the pain may also spread into one or both buttocks or hips or spread down the legs. What are the causes? This condition may be caused by:  Pregnancy. During pregnancy, extra stress is put on the SI joints because the pelvis widens.  Injury, such as: ? Car accidents. ? Sport-related injuries. ? Work-related injuries.  Having one leg that is shorter than the other.  Conditions that affect the joints, such as: ? Rheumatoid arthritis. ? Gout. ? Psoriatic arthritis. ? Joint infection (septic arthritis).  Sometimes, the cause of SI joint dysfunction is not known. What are the signs or symptoms? Symptoms of this condition include:  Aching or burning pain in the lower back. The pain may also spread to other areas, such as: ? Buttocks. ? Groin. ? Thighs and legs.  Muscle spasms in or around the painful areas.  Increased pain when standing, walking, running, stair climbing, bending, or lifting.  How is this diagnosed? Your health care provider will do a physical exam and take your medical history. During the exam, the health care provider may move one or both of your legs to different positions to check for pain. Various tests may be done to help verify the diagnosis, including:  Imaging tests to look for other causes of pain. These may include: ? MRI. ? CT scan. ? Bone scan.  Diagnostic injection. A numbing medicine is injected into the SI joint using a needle. If the pain is temporarily improved or stopped after the injection, this can indicate that SI joint dysfunction is the problem.  How is this treated? Treatment may vary depending on the  cause and severity of your condition. Treatment options may include:  Applying ice or heat to the lower back area. This can help to reduce pain and muscle spasms.  Medicines to relieve pain or inflammation or to relax the muscles.  Wearing a back brace (sacroiliac brace) to help support the joint while your back is healing.  Physical therapy to increase muscle strength around the joint and flexibility at the joint. This may also involve learning proper body positions and ways of moving to relieve stress on the joint.  Direct manipulation of the SI joint.  Injections of steroid medicine into the joint in order to reduce pain and swelling.  Radiofrequency ablation to burn away nerves that are carrying pain messages from the joint.  Use of a device that provides electrical stimulation in order to reduce pain at the joint.  Surgery to put in screws and plates that limit or prevent joint motion. This is rare.  Follow these instructions at home:  Rest as needed. Limit your activities as directed by your health care provider.  Take medicines only as directed by your health care provider.  If directed, apply ice to the affected area: ? Put ice in a plastic bag. ? Place a towel between your skin and the bag. ? Leave the ice on for 20 minutes, 2-3 times per day.  Use a heating pad or a moist heat pack as directed by your health care provider.  Exercise as directed by your health care provider or physical therapist.  Keep all follow-up visits   as directed by your health care provider. This is important. Contact a health care provider if:  Your pain is not controlled with medicine.  You have a fever.  You have increasingly severe pain. Get help right away if:  You have weakness, numbness, or tingling in your legs or feet.  You lose control of your bladder or bowel. This information is not intended to replace advice given to you by your health care provider. Make sure you discuss  any questions you have with your health care provider. Document Released: 06/17/2008 Document Revised: 08/27/2015 Document Reviewed: 11/26/2013 Elsevier Interactive Patient Education  2018 Elsevier Inc.  

## 2017-11-21 NOTE — Progress Notes (Signed)
Name: Kaitlyn Ali   MRN: [id number]    DOB: 05/25/1955   Date:11/21/2017       Progress Note  Subjective  Chief Complaint  Chief Complaint  Patient presents with  . Urinary Tract Infection    having pain in lower back on L) side, pressure when urinating, "feels like I have to go"    Urinary Tract Infection   This is a recurrent problem. The current episode started in the past 7 days (over the weekend). The problem occurs intermittently. The problem has been gradually worsening. The quality of the pain is described as aching. The pain is at a severity of 8/10. There has been no fever. Associated symptoms include frequency and urgency. Pertinent negatives include no chills, discharge, flank pain, hematuria, hesitancy, nausea, sweats or vomiting. Associated symptoms comments: Seem to completely empty. She has tried increased fluids for the symptoms. The treatment provided moderate relief. Her past medical history is significant for recurrent UTIs.  Urinary Frequency   This is a chronic problem. The problem occurs intermittently. The problem has been waxing and waning. The quality of the pain is described as aching (sacroiliac area). The pain is at a severity of 8/10. The pain is moderate. Associated symptoms include frequency and urgency. Pertinent negatives include no chills, discharge, flank pain, hematuria, hesitancy, nausea, sweats or vomiting. Her past medical history is significant for recurrent UTIs.    No problem-specific Assessment & Plan notes found for this encounter.   Past Medical History:  Diagnosis Date  . Anxiety   . Bursitis   . Depression   . Panic attack     Past Surgical History:  Procedure Laterality Date  . LAPAROSCOPIC HYSTERECTOMY    . TOE SURGERY     shaved bone in R) great toe  . TONSILLECTOMY      Family History  Problem Relation Age of Onset  . Diabetes Mother   . Cancer Father   . Cancer Sister     Social History   Socioeconomic History   . Marital status: Married    Spouse name: Not on file  . Number of children: Not on file  . Years of education: Not on file  . Highest education level: Not on file  Occupational History  . Not on file  Social Needs  . Financial resource strain: Not on file  . Food insecurity:    Worry: Not on file    Inability: Not on file  . Transportation needs:    Medical: Not on file    Non-medical: Not on file  Tobacco Use  . Smoking status: Former Research scientist (life sciences)  . Smokeless tobacco: Never Used  Substance and Sexual Activity  . Alcohol use: Yes    Alcohol/week: 0.0 standard drinks  . Drug use: No  . Sexual activity: Yes  Lifestyle  . Physical activity:    Days per week: Not on file    Minutes per session: Not on file  . Stress: Not on file  Relationships  . Social connections:    Talks on phone: Not on file    Gets together: Not on file    Attends religious service: Not on file    Active member of club or organization: Not on file    Attends meetings of clubs or organizations: Not on file    Relationship status: Not on file  . Intimate partner violence:    Fear of current or ex partner: Not on file    Emotionally abused: Not  on file    Physically abused: Not on file    Forced sexual activity: Not on file  Other Topics Concern  . Not on file  Social History Narrative  . Not on file    Allergies  Allergen Reactions  . Doxycycline Itching  . Nsaids     Bothers stomach    Outpatient Medications Prior to Visit  Medication Sig Dispense Refill  . buPROPion (WELLBUTRIN XL) 300 MG 24 hr tablet Take 1 tablet (300 mg total) by mouth every morning. Dr Bridgett Larsson (Patient taking differently: Take 150 mg by mouth every morning. Dr Toy Care) 30 tablet 0  . diazepam (VALIUM) 5 MG tablet Take 5 mg by mouth 2 (two) times daily as needed. Dr Toy Care  1  . diphenhydrAMINE (BENADRYL) 25 mg capsule Take 25 mg by mouth every 6 (six) hours as needed.    Marland Kitchen oxyCODONE-acetaminophen (PERCOCET) 10-325 MG per tablet  Take 1 tablet by mouth every 8 (eight) hours as needed. [facility]  0  . [facility] MELATONIN 3 MG TABS Take 1 tablet by mouth at bedtime. otc  1  . fluticasone (FLONASE) 50 MCG/ACT nasal spray SPRAY 2 SPRAYS INTO EACH NOSTRIL EVERY DAY (Patient not taking: Reported on 11/21/2017) 16 g 11  . triamcinolone cream (KENALOG) 0.1 % Apply 1 application topically 2 (two) times daily. (Patient not taking: Reported on 11/21/2017) 60 g 0   No facility-administered medications prior to visit.     Review of Systems  Constitutional: Negative for chills, fever, malaise/fatigue and weight loss.  HENT: Negative for ear discharge, ear pain and sore throat.   Eyes: Negative for blurred vision.  Respiratory: Negative for cough, sputum production, shortness of breath and wheezing.   Cardiovascular: Negative for chest pain, palpitations and leg swelling.  Gastrointestinal: Negative for abdominal pain, blood in stool, constipation, diarrhea, heartburn, melena, nausea and vomiting.  Genitourinary: Positive for frequency and urgency. Negative for dysuria, flank pain, hematuria and hesitancy.  Musculoskeletal: Negative for back pain, joint pain, myalgias and neck pain.  Skin: Negative for rash.  Neurological: Negative for dizziness, tingling, sensory change, focal weakness and headaches.  Endo/Heme/Allergies: Negative for environmental allergies and polydipsia. Does not bruise/bleed easily.  Psychiatric/Behavioral: Negative for depression and suicidal ideas. The patient is not nervous/anxious and does not have insomnia.      Objective  Vitals:   11/21/17 1416  BP: 120/70  Pulse: 70  Weight: 176 lb (79.8 kg)  Height: 5\' 8"  (1.727 m)    Physical Exam  Constitutional: No distress.  HENT:  Head: Normocephalic and atraumatic.  Right Ear: External ear normal.  Left Ear: External ear normal.  Nose: Nose normal.  Mouth/Throat: Oropharynx is clear and moist.  Eyes: Pupils are equal, round, and reactive to  light. Conjunctivae and EOM are normal. Right eye exhibits no discharge. Left eye exhibits no discharge.  Neck: Normal range of motion. Neck supple. No JVD present. No thyromegaly present.  Cardiovascular: Normal rate, regular rhythm, normal heart sounds and intact distal pulses. Exam reveals no gallop and no friction rub.  No murmur heard. Pulmonary/Chest: Effort normal and breath sounds normal. She has no wheezes. She has no rales.  Abdominal: Soft. Bowel sounds are normal. She exhibits no mass. There is no tenderness. There is no guarding.  Musculoskeletal: Normal range of motion. She exhibits no edema.       Lumbar back: She exhibits bony tenderness.  Left si tenderness  Lymphadenopathy:    She has no cervical adenopathy.  Neurological: She is alert. She has normal reflexes.  Skin: Skin is warm and dry. She is not diaphoretic.  Nursing note and vitals reviewed.     Assessment & Plan  Problem List Items Addressed This Visit    None    Visit Diagnoses    Urethritis    -  Primary   Patient with minimal leukocytes in urine. Will treat for urethritis/patient traveling to [place]. Prescibe macrobid bid for 3 days   Relevant Medications   nitrofurantoin, macrocrystal-monohydrate, (MACROBID) 100 MG capsule   Other Relevant Orders   POCT urinalysis dipstick (Completed)   Sprain of sacroiliac ligament, initial encounter       Pain location suggestive of sacroiliitis. Patient maxed on analgesics conservative measures suggested.   Frequency-urgency syndrome       Symptoms more suggestive of overactivebladder or incomplete emptying. Referral to urology.   Relevant Orders   Ambulatory referral to Urology   POCT urinalysis dipstick (Completed)      Meds ordered this encounter  Medications  . nitrofurantoin, macrocrystal-monohydrate, (MACROBID) 100 MG capsule    Sig: Take 1 capsule (100 mg total) by mouth 2 (two) times daily.    Dispense:  6 capsule    Refill:  0      Dr.  Otilio Miu [facility]  11/21/17

## 2017-12-29 ENCOUNTER — Other Ambulatory Visit
Admission: RE | Admit: 2017-12-29 | Discharge: 2017-12-29 | Disposition: A | Discharge status: Home / Self Care | Payer: BLUE CROSS/BLUE SHIELD | Source: Ambulatory Visit | Attending: Urology | Admitting: Urology

## 2017-12-29 ENCOUNTER — Ambulatory Visit (INDEPENDENT_AMBULATORY_CARE_PROVIDER_SITE_OTHER): Payer: BLUE CROSS/BLUE SHIELD | Admitting: Urology

## 2017-12-29 ENCOUNTER — Encounter: Payer: Self-pay | Admitting: Urology

## 2017-12-29 VITALS — BP 138/82 | HR 75 | Ht 68.0 in | Wt 177.0 lb

## 2017-12-29 DIAGNOSIS — R350 Frequency of micturition: Secondary | ICD-10-CM | POA: Insufficient documentation

## 2017-12-29 LAB — URINALYSIS, COMPLETE (UACMP) WITH MICROSCOPIC
Bacteria, UA: NONE SEEN
Bilirubin Urine: NEGATIVE
GLUCOSE, UA: NEGATIVE mg/dL
HGB URINE DIPSTICK: NEGATIVE
Ketones, ur: NEGATIVE mg/dL
Nitrite: NEGATIVE
PH: 5.5 (ref 5.0–8.0)
PROTEIN: NEGATIVE mg/dL
RBC / HPF: NONE SEEN RBC/hpf (ref 0–5)

## 2017-12-29 LAB — BLADDER SCAN AMB NON-IMAGING

## 2017-12-29 NOTE — Progress Notes (Signed)
12/29/2017 9:13 AM   Kaitlyn Ali 10-20-1955 [id number]  Referring provider: Juline Patch, MD [address] [place], [place] [address]  Chief Complaint  Patient presents with  . Urinary Frequency    New patient    HPI: Patient is a 62 year old Caucasian female who is referred to [place] by Dr. Juline Patch for frequency and rUTI's.    She was having urinary urgency that started a few months ago.  She was given three days of antibiotic.  This happened twice.  The symptoms abated with the antibiotic, but then two days later the symptoms came back and was given seven days of antibiotics and again the symptoms came back and was given three days of antibiotics.    She has not had any UA's or urine culture performed.    She drinks tons of water.  She has a cup of coffee daily and an occasional tea.  She does not drink soda.  She does not drink alcohol.   She is not having any urinary frequency, urgency, dysuria, nocturia, intermittency, hesitancy, straining to urinate, leakage and weak urinary stream at today's visit.    Patient denies any gross hematuria, dysuria or suprapubic/flank pain.  Patient denies any fevers, chills, nausea or vomiting.     She does not have a history of nephrolithiasis, rUTI's, GU malignancies, GU surgery or GU trauma.  She did state that she had her bladder stem stretched when she was [age] years old.    She is post menopausal.   She denies constipation and/or diarrhea.   She is a former smoker.  She quit 10 years ago.    Her UA today was positive for 0-5 WBC's.    PMH: Past Medical History:  Diagnosis Date  . Anxiety   . Bursitis   . Depression   . Panic attack     Surgical History: Past Surgical History:  Procedure Laterality Date  . LAPAROSCOPIC HYSTERECTOMY    . TOE SURGERY     shaved bone in R) great toe  . TONSILLECTOMY      Home Medications:  Allergies as of 12/29/2017      Reactions   Doxycycline Itching   Nsaids    Bothers stomach      Medication List        Accurate as of 12/29/17  9:13 AM. Always use your most recent med list.          buPROPion 150 MG 24 hr tablet Commonly known as:  WELLBUTRIN XL Take 150 mg by mouth every morning.   diazepam 5 MG tablet Commonly known as:  VALIUM Take 5 mg by mouth 2 (two) times daily as needed. Dr Toy Care   diphenhydrAMINE 25 mg capsule Commonly known as:  BENADRYL Take 25 mg by mouth every 6 (six) hours as needed.   fluticasone 50 MCG/ACT nasal spray Commonly known as:  FLONASE SPRAY 2 SPRAYS INTO EACH NOSTRIL EVERY DAY   nitrofurantoin (macrocrystal-monohydrate) 100 MG capsule Commonly known as:  MACROBID Take 1 capsule (100 mg total) by mouth 2 (two) times daily.   oxyCODONE-acetaminophen 10-325 MG tablet Commonly known as:  PERCOCET Take 1 tablet by mouth every 8 (eight) hours as needed. [facility]   triamcinolone cream 0.1 % Commonly known as:  KENALOG Apply 1 application topically 2 (two) times daily.       Allergies:  Allergies  Allergen Reactions  . Doxycycline Itching  . Nsaids  Bothers stomach    Family History: Family History  Problem Relation Age of Onset  . Diabetes Mother   . Cancer Father   . Cancer Sister     Social History:  reports that she has quit smoking. She has never used smokeless tobacco. She reports that she drinks alcohol. She reports that she does not use drugs.  ROS: UROLOGY Frequent Urination?: Yes Hard to postpone urination?: No Burning/pain with urination?: No Get up at night to urinate?: No Leakage of urine?: No Urine stream starts and stops?: No Trouble starting stream?: No Do you have to strain to urinate?: No Blood in urine?: No Urinary tract infection?: No Sexually transmitted disease?: No Injury to kidneys or bladder?: No Painful intercourse?: No Weak stream?: No Currently pregnant?: No Vaginal bleeding?: No Last menstrual period?:  n  Gastrointestinal Vomiting?: No Indigestion/heartburn?: No Diarrhea?: No Constipation?: No  Constitutional Fever: No Night sweats?: No Weight loss?: No Fatigue?: No  Skin Skin rash/lesions?: No Itching?: No  Eyes Blurred vision?: No Double vision?: No  Ears/Nose/Throat Sore throat?: No Sinus problems?: Yes  Hematologic/Lymphatic Swollen glands?: No Easy bruising?: No  Cardiovascular Leg swelling?: No Chest pain?: No  Respiratory Cough?: No Shortness of breath?: No  Endocrine Excessive thirst?: No  Musculoskeletal Back pain?: No Joint pain?: Yes  Neurological Headaches?: No Dizziness?: No  Psychologic Depression?: No Anxiety?: Yes  Physical Exam: BP 138/82   Pulse 75   Ht 5\' 8"  (1.727 m)   Wt 177 lb (80.3 kg)   BMI 26.91 kg/m   Constitutional: Well nourished. Alert and oriented, No acute distress. HEENT: [place] AT, moist mucus membranes. Trachea midline, no masses. Cardiovascular: No clubbing, cyanosis, or edema. Respiratory: Normal respiratory effort, no increased work of breathing. GI: Abdomen is soft, non tender, non distended, no abdominal masses. Liver and spleen not palpable.  No hernias appreciated.  Stool sample for occult testing is not indicated.   GU: No CVA tenderness.  No bladder fullness or masses.  Normal external genitalia, normal pubic hair distribution, no lesions.  Normal urethral meatus, no lesions, no prolapse, no discharge.   No urethral masses, tenderness and/or tenderness. No bladder fullness, tenderness or masses. Normal vagina mucosa, good estrogen effect, no discharge, no lesions, weak pelvic support, grade I cystocele no rectocele noted.  Uterus is surgically absent.  No adnexal/parametria masses or tenderness noted.  Anus and perineum are without rashes or lesions.    Skin: No rashes, bruises or suspicious lesions. Lymph: No cervical or inguinal adenopathy. Neurologic: Grossly intact, no focal deficits, moving all 4  extremities. Psychiatric: Normal mood and affect.  Laboratory Data: No results found for: WBC, HGB, HCT, MCV, PLT  Lab Results  Component Value Date   CREATININE 1.07 (H) 10/11/2017    No results found for: PSA  No results found for: TESTOSTERONE  No results found for: HGBA1C  No results found for: TSH     Component Value Date/Time   CHOL 234 (H) 02/16/2017 1138   HDL 42 02/16/2017 1138   CHOLHDL 5.6 (H) 02/16/2017 1138   LDLCALC 131 (H) 02/16/2017 1138    Lab Results  Component Value Date   AST 17 10/11/2017   No results found for: ALT No components found for: ALKALINEPHOPHATASE No components found for: BILIRUBINTOTAL  No results found for: ESTRADIOL  Urinalysis 0-5 WBC's.  See Epic.   I have reviewed the labs.   Pertinent Imaging: Results for Ali, Clarie RIGGS (MRN [id number]) as of 12/29/2017 09:06  Ref. Range  12/29/2017 08:43  Scan Result Unknown 68ml     Assessment & Plan:   1. Urgency Discussed with patient that as no UAs or urine cultures were performed it was difficult to know if her symptoms were due to urinary tract infection or some other etiology Her UA today is not very impressive with only 0-5 WBCs, but we will send for culture as one had not been performed in the past If urine culture returns negative, we will see how she feels after being without antibiotics for a few weeks She is to contact her office if she should experience the urgency again  2. Pelvic floor laxity Given handout on Kegel exercises    Return for pending UA .  These notes generated with voice recognition software. I apologize for typographical errors.  Zara Council, PA-C  [facility] [address] [place], [place] [address] [phone]

## 2017-12-31 LAB — URINE CULTURE: Culture: NO GROWTH

## 2018-01-07 ENCOUNTER — Telehealth: Payer: Self-pay | Admitting: Urology

## 2018-01-07 NOTE — Telephone Encounter (Signed)
Would you check to see how Kaitlyn Ali is doing as she has not been on an antibiotic for few weeks?

## 2018-01-08 NOTE — Telephone Encounter (Signed)
LMOM for patient to return call.

## 2018-01-10 NOTE — Telephone Encounter (Signed)
Patient states she is doing good.  

## 2018-02-17 DIAGNOSIS — F411 Generalized anxiety disorder: Secondary | ICD-10-CM | POA: Diagnosis not present

## 2018-02-17 DIAGNOSIS — F3342 Major depressive disorder, recurrent, in full remission: Secondary | ICD-10-CM | POA: Diagnosis not present

## 2018-03-14 ENCOUNTER — Ambulatory Visit: Payer: BLUE CROSS/BLUE SHIELD | Admitting: Family Medicine

## 2018-03-14 ENCOUNTER — Encounter: Payer: Self-pay | Admitting: Family Medicine

## 2018-03-14 VITALS — BP 130/70 | HR 80 | Temp 97.8°F | Ht 68.0 in | Wt 175.0 lb

## 2018-03-14 DIAGNOSIS — J0100 Acute maxillary sinusitis, unspecified: Secondary | ICD-10-CM

## 2018-03-14 MED ORDER — AMOXICILLIN-POT CLAVULANATE 875-125 MG PO TABS: 1.0000 | ORAL_TABLET | Freq: Two times a day (BID) | ORAL | 0 refills | Status: DC

## 2018-03-14 NOTE — Progress Notes (Signed)
Date:  03/14/2018   Name:  Kaitlyn Ali   DOB:  Mar 08, 1956   MRN:  [id number]   Chief Complaint: Sinusitis (bloody nasal production, no cough, fever x 3 days) Sinusitis  This is a new problem. The current episode started in the past 7 days (Monday). The problem has been gradually worsening since onset. The maximum temperature recorded prior to her arrival was 100.4 - 100.9 F. Her pain is at a severity of 2/10 (maxillary areas). The pain is mild. Associated symptoms include chills, congestion, diaphoresis, headaches and sinus pressure. Pertinent negatives include no coughing, ear pain, hoarse voice, neck pain, shortness of breath, sneezing, sore throat or swollen glands. Past treatments include acetaminophen and oral decongestants.     Review of Systems  Constitutional: Positive for chills and diaphoresis. Negative for fatigue, fever and unexpected weight change.  HENT: Positive for congestion and sinus pressure. Negative for ear discharge, ear pain, hoarse voice, rhinorrhea, sneezing and sore throat.   Eyes: Negative for photophobia, pain, discharge, redness and itching.  Respiratory: Negative for cough, shortness of breath, wheezing and stridor.   Gastrointestinal: Negative for abdominal pain, blood in stool, constipation, diarrhea, nausea and vomiting.  Endocrine: Negative for cold intolerance, heat intolerance, polydipsia, polyphagia and polyuria.  Genitourinary: Negative for dysuria, flank pain, frequency, hematuria, menstrual problem, pelvic pain, urgency, vaginal bleeding and vaginal discharge.  Musculoskeletal: Negative for arthralgias, back pain, myalgias and neck pain.  Skin: Negative for rash.  Allergic/Immunologic: Negative for environmental allergies and food allergies.  Neurological: Positive for headaches. Negative for dizziness, weakness, light-headedness and numbness.  Hematological: Negative for adenopathy. Does not bruise/bleed easily.  Psychiatric/Behavioral:  Negative for dysphoric mood. The patient is not nervous/anxious.     Patient Active Problem List   Diagnosis Date Noted  . Chronic pain of both knees (Secondary Area of Pain) (L>R) 10/11/2017  . Chronic right shoulder pain (Primary Area of Pain) 10/11/2017  . Chronic pain syndrome 10/11/2017  . Long term current use of opiate analgesic 10/11/2017  . Pharmacologic therapy 10/11/2017  . Disorder of skeletal system 10/11/2017  . Problems influencing health status 10/11/2017    Allergies  Allergen Reactions  . Doxycycline Itching  . Nsaids     Bothers stomach    Past Surgical History:  Procedure Laterality Date  . LAPAROSCOPIC HYSTERECTOMY    . TOE SURGERY     shaved bone in R) great toe  . TONSILLECTOMY      Social History   Tobacco Use  . Smoking status: Former Research scientist (life sciences)  . Smokeless tobacco: Never Used  Substance Use Topics  . Alcohol use: Yes    Alcohol/week: 0.0 standard drinks  . Drug use: No     Medication list has been reviewed and updated.  Current Meds  Medication Sig  . diazepam (VALIUM) 5 MG tablet Take 5 mg by mouth 2 (two) times daily as needed. Dr Toy Care  . diphenhydrAMINE (BENADRYL) 25 mg capsule Take 25 mg by mouth every 6 (six) hours as needed.  Marland Kitchen oxyCODONE-acetaminophen (PERCOCET) 10-325 MG per tablet Take 1 tablet by mouth every 8 (eight) hours as needed. [facility] pain management  . triamcinolone cream (KENALOG) 0.1 % Apply 1 application topically 2 (two) times daily.  . [DISCONTINUED] buPROPion (WELLBUTRIN XL) 150 MG 24 hr tablet Take 150 mg by mouth every morning.    PHQ 2/9 Scores 03/14/2018 10/25/2017 10/11/2017 05/14/2015  PHQ - 2 Score 0 0 0 0  PHQ- 9 Score 0 - - -  Physical Exam  Constitutional: She is oriented to person, place, and time. She appears well-developed and well-nourished.  HENT:  Head: Normocephalic.  Right Ear: Hearing, tympanic membrane, external ear and ear canal normal.  Left Ear: Hearing, tympanic membrane, external ear and  ear canal normal.  Nose: Mucosal edema present. Right sinus exhibits maxillary sinus tenderness and frontal sinus tenderness. Left sinus exhibits maxillary sinus tenderness and frontal sinus tenderness.  Mouth/Throat: Oropharynx is clear and moist. No oropharyngeal exudate, posterior oropharyngeal edema or posterior oropharyngeal erythema.  Eyes: Pupils are equal, round, and reactive to light. Conjunctivae and EOM are normal. Lids are everted and swept, no foreign bodies found. Left eye exhibits no hordeolum. No foreign body present in the left eye. Right conjunctiva is not injected. Left conjunctiva is not injected. No scleral icterus.  Neck: Normal range of motion. Neck supple. No JVD present. No tracheal deviation present. No thyromegaly present.  Cardiovascular: Normal rate, regular rhythm, normal heart sounds and intact distal pulses. Exam reveals no gallop and no friction rub.  No murmur heard. Pulmonary/Chest: Effort normal and breath sounds normal. No stridor. No respiratory distress. She has no decreased breath sounds. She has no wheezes. She has no rhonchi. She has no rales.  Abdominal: Soft. Bowel sounds are normal. She exhibits no mass. There is no hepatosplenomegaly or hepatomegaly. There is no tenderness. There is no rebound and no guarding.  Musculoskeletal: Normal range of motion. She exhibits no edema or tenderness.  Lymphadenopathy:       Head (right side): No submandibular adenopathy present.       Head (left side): No submandibular adenopathy present.    She has no cervical adenopathy.  Neurological: She is alert and oriented to person, place, and time. She has normal strength. She displays normal reflexes. No cranial nerve deficit.  Skin: Skin is warm. No rash noted.  Psychiatric: She has a normal mood and affect. Her mood appears not anxious. She does not exhibit a depressed mood.  Nursing note and vitals reviewed.   BP 130/70   Pulse 80   Temp 97.8 F (36.6 C) (Oral)    Ht 5\' 8"  (1.727 m)   Wt 175 lb (79.4 kg)   BMI 26.61 kg/m   Assessment and Plan:  1. Acute maxillary sinusitis, recurrence not specified Acute, start Augmentin - amoxicillin-clavulanate (AUGMENTIN) 875-125 MG tablet; Take 1 tablet by mouth 2 (two) times daily.  Dispense: 20 tablet; Refill: 0   Dr. Macon [facility]  03/14/2018

## 2018-04-12 ENCOUNTER — Other Ambulatory Visit: Payer: Self-pay

## 2018-04-12 ENCOUNTER — Other Ambulatory Visit: Payer: Self-pay | Admitting: Urology

## 2018-04-12 ENCOUNTER — Other Ambulatory Visit
Admission: RE | Admit: 2018-04-12 | Discharge: 2018-04-12 | Disposition: A | Discharge status: Home / Self Care | Payer: BLUE CROSS/BLUE SHIELD | Attending: Urology | Admitting: Urology

## 2018-04-12 DIAGNOSIS — R300 Dysuria: Secondary | ICD-10-CM | POA: Diagnosis not present

## 2018-04-12 DIAGNOSIS — T3695XA Adverse effect of unspecified systemic antibiotic, initial encounter: Principal | ICD-10-CM

## 2018-04-12 DIAGNOSIS — B379 Candidiasis, unspecified: Secondary | ICD-10-CM

## 2018-04-12 LAB — URINALYSIS, COMPLETE (UACMP) WITH MICROSCOPIC
Bilirubin Urine: NEGATIVE
GLUCOSE, UA: NEGATIVE mg/dL
KETONES UR: NEGATIVE mg/dL
Nitrite: POSITIVE — AB
PH: 5.5 (ref 5.0–8.0)
Protein, ur: NEGATIVE mg/dL
Specific Gravity, Urine: 1.015 (ref 1.005–1.030)
WBC, UA: >50 WBC/hpf (ref 0–5)

## 2018-04-12 MED ORDER — FLUCONAZOLE 150 MG PO TABS: 150.0000 mg | ORAL_TABLET | Freq: Once | ORAL | 0 refills | Status: AC

## 2018-04-12 NOTE — Progress Notes (Unsigned)
sent in diflucan to [facility]

## 2018-04-13 ENCOUNTER — Telehealth: Payer: Self-pay

## 2018-04-13 MED ORDER — SULFAMETHOXAZOLE-TRIMETHOPRIM 800-160 MG PO TABS: 1.0000 | ORAL_TABLET | Freq: Two times a day (BID) | ORAL | 0 refills | Status: DC

## 2018-04-13 MED ORDER — FLUCONAZOLE 150 MG PO TABS: 150.0000 mg | ORAL_TABLET | Freq: Once | ORAL | 0 refills | Status: AC

## 2018-04-13 NOTE — Telephone Encounter (Signed)
Informed patient of urine results and recommendations.  Rx for septra sent to pharmacy.  Patient also request rx for yeast infection she gets when on antibiotic.  Please advise.

## 2018-04-13 NOTE — Telephone Encounter (Signed)
Diflucan rx sent to pharmacy.

## 2018-04-13 NOTE — Telephone Encounter (Signed)
-----   Message from Nori Riis, PA-C sent at 04/13/2018  7:52 AM EST ----- Please let Mrs. Martinique know that her UA specimen is suspicious for infection, so we are sending it for culture.  I would her to start Septra DS, BID for seven days.

## 2018-04-13 NOTE — Telephone Encounter (Signed)
Diflucan 150 mg, one tablet.

## 2018-04-15 LAB — URINE CULTURE

## 2018-04-16 ENCOUNTER — Telehealth: Payer: Self-pay

## 2018-04-16 MED ORDER — FLUCONAZOLE 150 MG PO TABS: 150.0000 mg | ORAL_TABLET | Freq: Once | ORAL | 0 refills | Status: AC

## 2018-04-16 MED ORDER — NITROFURANTOIN MONOHYD MACRO 100 MG PO CAPS: 100.0000 mg | ORAL_CAPSULE | Freq: Two times a day (BID) | ORAL | 0 refills | Status: DC

## 2018-04-16 NOTE — Telephone Encounter (Signed)
Informed patient to stop current UTI medication and start macrobid.  Macrobid rx sent to pharmacy along with diflucan per Zara Council.

## 2018-04-16 NOTE — Telephone Encounter (Signed)
-----   Message from Nori Riis, PA-C sent at 04/16/2018  8:13 AM EST ----- Please let Mrs. Martinique know that her urine culture was positive for infection.  She will need to stop the Augmentin, as the bacteria is resistant, and start Macrobid 100 mg, BID x seven days.  She should also make an appointment after she finishes her antibiotic as this was a bacteria that was resistant to several antibiotics.

## 2018-05-28 ENCOUNTER — Encounter: Payer: Self-pay | Admitting: Family Medicine

## 2018-05-28 ENCOUNTER — Ambulatory Visit: Payer: BLUE CROSS/BLUE SHIELD | Admitting: Family Medicine

## 2018-05-28 VITALS — BP 138/102 | HR 72 | Temp 98.3°F | Ht 68.0 in | Wt 173.0 lb

## 2018-05-28 DIAGNOSIS — J0100 Acute maxillary sinusitis, unspecified: Secondary | ICD-10-CM | POA: Diagnosis not present

## 2018-05-28 LAB — POCT INFLUENZA A/B
INFLUENZA B, POC: NEGATIVE
Influenza A, POC: NEGATIVE

## 2018-05-28 MED ORDER — AZITHROMYCIN 250 MG PO TABS: ORAL_TABLET | ORAL | 0 refills | Status: DC

## 2018-05-28 NOTE — Progress Notes (Signed)
Date:  05/28/2018   Name:  Kaitlyn Ali   DOB:  [birth year]   MRN:  [id number]   Chief Complaint: Sinusitis (stuffy head, drainage, sore throat)  Sinusitis  This is a new problem. The current episode started in the past 7 days. The problem has been waxing and waning since onset. There has been no fever. The fever has been present for 3 to 4 days. Her pain is at a severity of 4/10. The pain is mild. Associated symptoms include chills, congestion, coughing, diaphoresis, headaches, neck pain, sinus pressure, sneezing and a sore throat. Pertinent negatives include no ear pain, hoarse voice, shortness of breath or swollen glands. Past treatments include oral decongestants and acetaminophen (last antibiotics 4 wks/ ). The treatment provided mild relief.    Review of Systems  Constitutional: Positive for chills and diaphoresis. Negative for fatigue, fever and unexpected weight change.  HENT: Positive for congestion, sinus pressure, sneezing and sore throat. Negative for ear discharge, ear pain, hoarse voice and rhinorrhea.   Eyes: Negative for photophobia, pain, discharge, redness and itching.  Respiratory: Positive for cough. Negative for shortness of breath, wheezing and stridor.   Gastrointestinal: Negative for abdominal pain, blood in stool, constipation, diarrhea, nausea and vomiting.  Endocrine: Negative for cold intolerance, heat intolerance, polydipsia, polyphagia and polyuria.  Genitourinary: Negative for dysuria, flank pain, frequency, hematuria, menstrual problem, pelvic pain, urgency, vaginal bleeding and vaginal discharge.  Musculoskeletal: Positive for neck pain. Negative for arthralgias, back pain and myalgias.  Skin: Negative for rash.  Allergic/Immunologic: Negative for environmental allergies and food allergies.  Neurological: Positive for headaches. Negative for dizziness, weakness, light-headedness and numbness.  Hematological: Negative for adenopathy. Does not  bruise/bleed easily.  Psychiatric/Behavioral: Negative for dysphoric mood. The patient is not nervous/anxious.     Patient Active Problem List   Diagnosis Date Noted  . Chronic pain of both knees (Secondary Area of Pain) (L>R) 10/11/2017  . Chronic right shoulder pain (Primary Area of Pain) 10/11/2017  . Chronic pain syndrome 10/11/2017  . Long term current use of opiate analgesic 10/11/2017  . Pharmacologic therapy 10/11/2017  . Disorder of skeletal system 10/11/2017  . Problems influencing health status 10/11/2017    Allergies  Allergen Reactions  . Doxycycline Itching  . Nsaids     Bothers stomach    Past Surgical History:  Procedure Laterality Date  . LAPAROSCOPIC HYSTERECTOMY    . TOE SURGERY     shaved bone in R) great toe  . TONSILLECTOMY      Social History   Tobacco Use  . Smoking status: Former Research scientist (life sciences)  . Smokeless tobacco: Never Used  Substance Use Topics  . Alcohol use: Yes    Alcohol/week: 0.0 standard drinks  . Drug use: No     Medication list has been reviewed and updated.  Current Meds  Medication Sig  . diazepam (VALIUM) 5 MG tablet Take 5 mg by mouth 2 (two) times daily as needed. Dr Toy Care  . diphenhydrAMINE (BENADRYL) 25 mg capsule Take 25 mg by mouth every 6 (six) hours as needed.  Marland Kitchen oxyCODONE-acetaminophen (PERCOCET) 10-325 MG per tablet Take 1 tablet by mouth every 8 (eight) hours as needed. [facility] pain management  . triamcinolone cream (KENALOG) 0.1 % Apply 1 application topically 2 (two) times daily.  . [DISCONTINUED] nitrofurantoin, macrocrystal-monohydrate, (MACROBID) 100 MG capsule Take 1 capsule (100 mg total) by mouth 2 (two) times daily.  . [DISCONTINUED] sulfamethoxazole-trimethoprim (BACTRIM DS,SEPTRA DS) 800-160 MG tablet Take 1 tablet  by mouth 2 (two) times daily.    PHQ 2/9 Scores 03/14/2018 10/25/2017 10/11/2017 05/14/2015  PHQ - 2 Score 0 0 0 0  PHQ- 9 Score 0 - - -    Physical Exam Vitals signs and nursing note reviewed.    Constitutional:      General: She is not in acute distress.    Appearance: She is not diaphoretic.  HENT:     Head: Normocephalic and atraumatic.     Jaw: There is normal jaw occlusion.     Salivary Glands: Right salivary gland is not tender. Left salivary gland is not tender.     Right Ear: Tympanic membrane and external ear normal.     Left Ear: Tympanic membrane and external ear normal.     Nose:     Right Turbinates: Not enlarged or swollen.     Left Turbinates: Not enlarged or swollen.     Right Sinus: Maxillary sinus tenderness and frontal sinus tenderness present.     Left Sinus: Maxillary sinus tenderness and frontal sinus tenderness present.     Mouth/Throat:     Pharynx: Oropharynx is clear. No pharyngeal swelling, oropharyngeal exudate or posterior oropharyngeal erythema.  Eyes:     General:        Right eye: No discharge.        Left eye: No discharge.     Conjunctiva/sclera: Conjunctivae normal.     Pupils: Pupils are equal, round, and reactive to light.  Neck:     Musculoskeletal: Normal range of motion and neck supple.     Thyroid: No thyromegaly.     Vascular: No JVD.  Cardiovascular:     Rate and Rhythm: Normal rate and regular rhythm.     Heart sounds: Normal heart sounds. No murmur. No friction rub. No gallop.   Pulmonary:     Effort: Pulmonary effort is normal.     Breath sounds: Normal breath sounds.  Abdominal:     General: Bowel sounds are normal.     Palpations: Abdomen is soft. There is no mass.     Tenderness: There is no abdominal tenderness. There is no guarding.  Musculoskeletal: Normal range of motion.  Lymphadenopathy:     Cervical: No cervical adenopathy.  Skin:    General: Skin is warm and dry.  Neurological:     Mental Status: She is alert.     Deep Tendon Reflexes: Reflexes are normal and symmetric.     BP (!) 138/102   Pulse 72   Temp 98.3 F (36.8 C) (Oral)   Ht 5\' 8"  (1.727 m)   Wt 173 lb (78.5 kg)   BMI 26.30 kg/m    Assessment and Plan: 1. Acute maxillary sinusitis, recurrence not specified Acute.  Recurrent.  Patient has exam consistent with tenderness in the frontal and maxillary areas both left and right.  Patient has had some blood mucus discharge from the nasal passage.  This is consistent with an acute maxillary sinusitis and will treat with azithromycin 250 mg 2 today  and 1 a day for 4 days thereafter. - azithromycin (ZITHROMAX) 250 MG tablet; 2 today then 1 a day for 4 days  Dispense: 6 tablet; Refill: 0 - POCT Influenza A/B

## 2018-08-18 DIAGNOSIS — F411 Generalized anxiety disorder: Secondary | ICD-10-CM | POA: Diagnosis not present

## 2018-10-04 ENCOUNTER — Other Ambulatory Visit: Payer: Self-pay

## 2018-10-04 ENCOUNTER — Ambulatory Visit (INDEPENDENT_AMBULATORY_CARE_PROVIDER_SITE_OTHER): Payer: BC Managed Care – PPO | Admitting: Family Medicine

## 2018-10-04 VITALS — Temp 97.8°F

## 2018-10-04 DIAGNOSIS — Z719 Counseling, unspecified: Secondary | ICD-10-CM

## 2018-10-04 NOTE — Progress Notes (Signed)
    Date:  10/04/2018   Name:  Kaitlyn Ali   DOB:  03/28/1956   MRN:  [id number]     Review of Systems  Patient Active Problem List   Diagnosis Date Noted  . Chronic pain of both knees (Secondary Area of Pain) (L>R) 10/11/2017  . Chronic right shoulder pain (Primary Area of Pain) 10/11/2017  . Chronic pain syndrome 10/11/2017  . Long term current use of opiate analgesic 10/11/2017  . Pharmacologic therapy 10/11/2017  . Disorder of skeletal system 10/11/2017  . Problems influencing health status 10/11/2017    Allergies  Allergen Reactions  . Doxycycline Itching  . Nsaids     Bothers stomach    Past Surgical History:  Procedure Laterality Date  . LAPAROSCOPIC HYSTERECTOMY    . TOE SURGERY     shaved bone in R) great toe  . TONSILLECTOMY      Social History   Tobacco Use  . Smoking status: Former Research scientist (life sciences)  . Smokeless tobacco: Never Used  Substance Use Topics  . Alcohol use: Yes    Alcohol/week: 0.0 standard drinks  . Drug use: No     Medication list has been reviewed and updated.  Current Meds  Medication Sig  . diazepam (VALIUM) 5 MG tablet Take 5 mg by mouth 2 (two) times daily as needed. Dr Toy Care  . diphenhydrAMINE (BENADRYL) 25 mg capsule Take 25 mg by mouth every 6 (six) hours as needed.  Marland Kitchen oxyCODONE-acetaminophen (PERCOCET) 10-325 MG per tablet Take 1 tablet by mouth every 8 (eight) hours as needed. [facility] pain management  . triamcinolone cream (KENALOG) 0.1 % Apply 1 application topically 2 (two) times daily.    PHQ 2/9 Scores 10/04/2018 03/14/2018 10/25/2017 10/11/2017  PHQ - 2 Score 1 0 0 0  PHQ- 9 Score 1 0 - -    BP Readings from Last 3 Encounters:  05/28/18 (!) 138/102  03/14/18 130/70  12/29/17 138/82    Physical Exam  Wt Readings from Last 3 Encounters:  05/28/18 173 lb (78.5 kg)  03/14/18 175 lb (79.4 kg)  12/29/17 177 lb (80.3 kg)    Temp 97.8 F (36.6 C) (Oral)   Assessment and Plan:  advised [facility] to go to [facility] to be tested after she stated that her employer would not let her come back until after testing. Gave address and phone number to facility in [place]. Also, explained that I couldn't provide a note, since I did not put her out of work.

## 2018-10-05 DIAGNOSIS — J029 Acute pharyngitis, unspecified: Secondary | ICD-10-CM | POA: Diagnosis not present

## 2018-10-05 DIAGNOSIS — Z20828 Contact with and (suspected) exposure to other viral communicable diseases: Secondary | ICD-10-CM | POA: Diagnosis not present

## 2019-02-06 ENCOUNTER — Other Ambulatory Visit: Payer: Self-pay | Admitting: Family Medicine

## 2019-02-06 DIAGNOSIS — L309 Dermatitis, unspecified: Secondary | ICD-10-CM

## 2019-05-29 DIAGNOSIS — F411 Generalized anxiety disorder: Secondary | ICD-10-CM | POA: Diagnosis not present

## 2020-07-10 ENCOUNTER — Other Ambulatory Visit: Payer: Self-pay | Admitting: Family Medicine

## 2020-07-10 DIAGNOSIS — L309 Dermatitis, unspecified: Secondary | ICD-10-CM

## 2020-07-10 NOTE — Telephone Encounter (Signed)
   Notes to clinic:  script requested has expired  Review for new script    Requested Prescriptions  Pending Prescriptions Disp Refills   triamcinolone cream (KENALOG) 0.1 % [Pharmacy Med Name: TRIAMCINOLONE 0.1% CREAM] 60 g 0    Sig: APPLY TO [place]      Dermatology:  Corticosteroids Failed - 07/10/2020  9:34 AM      Failed - Valid encounter within last 12 months    Recent Outpatient Visits           1 year ago Encounter for consultation   [facility] Juline Patch, MD   2 years ago Acute maxillary sinusitis, recurrence not specified   [facility] Juline Patch, MD   2 years ago Acute maxillary sinusitis, recurrence not specified   [facility] Juline Patch, MD   2 years ago Urethritis   [facility] Juline Patch, MD   2 years ago Dermatitis   [facility] Juline Patch, MD

## 2020-07-17 ENCOUNTER — Other Ambulatory Visit: Payer: Self-pay | Admitting: Family Medicine

## 2020-07-17 DIAGNOSIS — L309 Dermatitis, unspecified: Secondary | ICD-10-CM

## 2020-07-17 NOTE — Telephone Encounter (Signed)
Requested medication (s) are due for refill today:   Provider to review  Requested medication (s) are on the active medication list:   Yes  Future visit scheduled:   No   Last ordered: 02/08/2019 60 g, 0 refills  Returned because rx has expired.    This was also refused on 07/10/2020   Requested Prescriptions  Pending Prescriptions Disp Refills   triamcinolone cream (KENALOG) 0.1 % [Pharmacy Med Name: TRIAMCINOLONE 0.1% CREAM] 60 g 0    Sig: APPLY TO AFFECTED AREA TWICE A DAY      Dermatology:  Corticosteroids Failed - 07/17/2020  2:23 PM      Failed - Valid encounter within last 12 months    Recent Outpatient Visits           1 year ago Encounter for consultation   [facility] Juline Patch, MD   2 years ago Acute maxillary sinusitis, recurrence not specified   [facility] Juline Patch, MD   2 years ago Acute maxillary sinusitis, recurrence not specified   [facility] Juline Patch, MD   2 years ago Urethritis   [facility] Juline Patch, MD   2 years ago Dermatitis   [facility] Juline Patch, MD

## 2020-07-20 NOTE — Telephone Encounter (Signed)
Patient wasn't sure if the medline came from her dermatologist. I did inform her that if she wanted dr Ronnald Ramp to refill it for her she would need to come in for an office visit.

## 2021-04-19 ENCOUNTER — Ambulatory Visit: Payer: PRIVATE HEALTH INSURANCE | Admitting: Family Medicine

## 2021-04-19 ENCOUNTER — Encounter: Payer: Self-pay | Admitting: Family Medicine

## 2021-04-19 ENCOUNTER — Other Ambulatory Visit: Payer: Self-pay

## 2021-04-19 VITALS — BP 152/98 | HR 80 | Ht 68.0 in | Wt 179.0 lb

## 2021-04-19 DIAGNOSIS — R030 Elevated blood-pressure reading, without diagnosis of hypertension: Secondary | ICD-10-CM

## 2021-04-19 DIAGNOSIS — H6983 Other specified disorders of Eustachian tube, bilateral: Secondary | ICD-10-CM

## 2021-04-19 MED ORDER — PREDNISONE 10 MG PO TABS: 10.0000 mg | ORAL_TABLET | Freq: Every day | ORAL | 0 refills | Status: DC

## 2021-04-19 MED ORDER — FLUTICASONE PROPIONATE 50 MCG/ACT NA SUSP: 2.0000 | Freq: Every day | NASAL | 6 refills | Status: DC

## 2021-04-19 NOTE — Progress Notes (Signed)
Date:  04/19/2021   Name:  Kaitlyn Ali   DOB:  25-Sep-1955   MRN:  [id number]   Chief Complaint: Ear Pain (R) ear pain with cough- congestion but not coming out)  Otalgia  There is pain in the right ear. This is a new problem. The current episode started in the past 7 days. The problem occurs constantly. The problem has been waxing and waning. There has been no fever. The pain is at a severity of 7/10. The pain is moderate. Pertinent negatives include no ear discharge, hearing loss, neck pain, rash, rhinorrhea or sore throat. She has tried nothing for the symptoms.   Lab Results  Component Value Date   NA 145 (H) 10/11/2017   K 5.0 10/11/2017   CO2 25 02/16/2017   GLUCOSE 102 (H) 10/11/2017   BUN 9 10/11/2017   CREATININE 1.07 (H) 10/11/2017   CALCIUM 10.3 10/11/2017   GFRNONAA 56 (L) 10/11/2017   Lab Results  Component Value Date   CHOL 234 (H) 02/16/2017   HDL 42 02/16/2017   LDLCALC 131 (H) 02/16/2017   TRIG 306 (H) 02/16/2017   CHOLHDL 5.6 (H) 02/16/2017   No results found for: TSH No results found for: HGBA1C No results found for: WBC, HGB, HCT, MCV, PLT Lab Results  Component Value Date   AST 17 10/11/2017   ALKPHOS 70 10/11/2017   BILITOT 0.4 10/11/2017   Lab Results  Component Value Date   25OHVITD2 <1.0 10/11/2017   25OHVITD3 26 10/11/2017     Review of Systems  HENT:  Positive for ear pain. Negative for ear discharge, hearing loss, rhinorrhea and sore throat.   Musculoskeletal:  Negative for neck pain.  Skin:  Negative for rash.   Patient Active Problem List   Diagnosis Date Noted   Chronic pain of both knees (Secondary Area of Pain) (L>R) 10/11/2017   Chronic right shoulder pain (Primary Area of Pain) 10/11/2017   Chronic pain syndrome 10/11/2017   Long term current use of opiate analgesic 10/11/2017   Pharmacologic therapy 10/11/2017   Disorder of skeletal system 10/11/2017   Problems influencing health status 10/11/2017    Allergies   Allergen Reactions   Doxycycline Itching   Nsaids     Bothers stomach    Past Surgical History:  Procedure Laterality Date   LAPAROSCOPIC HYSTERECTOMY     TOE SURGERY     shaved bone in R) great toe   TONSILLECTOMY      Social History   Tobacco Use   Smoking status: Former   Smokeless tobacco: Never  Substance Use Topics   Alcohol use: Yes    Alcohol/week: 0.0 standard drinks   Drug use: No     Medication list has been reviewed and updated.  No outpatient medications have been marked as taking for the 04/19/21 encounter (Office Visit) with Juline Patch, MD.    [facility] 2/9 Scores 04/19/2021 10/04/2018 03/14/2018 10/25/2017  PHQ - 2 Score 0 1 0 0  PHQ- 9 Score - 1 0 -    No flowsheet data found.  BP Readings from Last 3 Encounters:  04/19/21 (!) 160/100  05/28/18 (!) 138/102  03/14/18 130/70    Physical Exam Vitals and nursing note reviewed.  Constitutional:      Appearance: She is well-developed.  HENT:     Head: Normocephalic.     Jaw: There is normal jaw occlusion.     Right Ear: External ear normal. No middle ear effusion.  There is no impacted cerumen. Tympanic membrane is retracted. Tympanic membrane is not erythematous or bulging.     Left Ear: External ear normal.  No middle ear effusion. There is no impacted cerumen. Tympanic membrane is retracted. Tympanic membrane is not erythematous or bulging.  Eyes:     General: Lids are everted, no foreign bodies appreciated. No scleral icterus.       Left eye: No foreign body or hordeolum.     Conjunctiva/sclera: Conjunctivae normal.     Right eye: Right conjunctiva is not injected.     Left eye: Left conjunctiva is not injected.     Pupils: Pupils are equal, round, and reactive to light.  Neck:     Thyroid: No thyromegaly.     Vascular: No JVD.     Trachea: No tracheal deviation.  Cardiovascular:     Rate and Rhythm: Normal rate and regular rhythm.     Heart sounds: Normal heart sounds. No murmur heard.    No friction rub. No gallop.  Pulmonary:     Effort: Pulmonary effort is normal. No respiratory distress.     Breath sounds: Normal breath sounds. No wheezing or rales.  Abdominal:     General: Bowel sounds are normal.     Palpations: Abdomen is soft. There is no mass.     Tenderness: There is no abdominal tenderness. There is no guarding or rebound.  Musculoskeletal:        General: No tenderness. Normal range of motion.     Cervical back: Normal range of motion and neck supple.  Lymphadenopathy:     Cervical: No cervical adenopathy.  Skin:    General: Skin is warm.     Findings: No rash.  Neurological:     Mental Status: She is alert and oriented to person, place, and time.     Cranial Nerves: No cranial nerve deficit.     Deep Tendon Reflexes: Reflexes normal.  Psychiatric:        Mood and Affect: Mood is not anxious or depressed.    Wt Readings from Last 3 Encounters:  04/19/21 179 lb (81.2 kg)  05/28/18 173 lb (78.5 kg)  03/14/18 175 lb (79.4 kg)    BP (!) 160/100    Pulse 80    Ht 5\' 8"  (1.727 m)    Wt 179 lb (81.2 kg)    BMI 27.22 kg/m   Assessment and Plan:  1. Eustachian tube dysfunction, bilateral New onset.  Persistent.  Patient on examination has retracted tympanic membranes bilateral there is no TMJ tenderness no oral tenderness no adenopathy.  Patient used to be on Flonase and that was represcribed and we will put her on low prednisone 10 mg once a day for 2 weeks to reduce swelling in the eustachian tubes bilateral. - fluticasone (FLONASE) 50 MCG/ACT nasal spray; Place 2 sprays into both nostrils daily.  Dispense: 16 g; Refill: 6 - predniSONE (DELTASONE) 10 MG tablet; Take 1 tablet (10 mg total) by mouth daily with breakfast.  Dispense: 14 tablet; Refill: 0  2. Elevated blood-pressure reading, without diagnosis of hypertension Patient was noted to have elevated reading of blood pressure.  Patient has been given a Dash diet to work on that we will recheck blood  pressure in 6 weeks.

## 2021-04-19 NOTE — Patient Instructions (Signed)

## 2021-04-22 ENCOUNTER — Ambulatory Visit: Payer: Self-pay

## 2021-04-22 ENCOUNTER — Telehealth: Payer: Self-pay

## 2021-04-22 NOTE — Telephone Encounter (Signed)
I called pt back to ask about rash/ prednisone reaction? Pt stated she was having chest pain and a blistery rash. She says the chest pain has not happened today after I advised her to go to hospital. I explained the rash could be from shingles as well, but the treatment would be with prednisone and antiviral. Also, explained the body makes prednisone, should not be an allergic reaction to prednisone as she suspected.

## 2021-04-22 NOTE — Telephone Encounter (Signed)
° ° °  Chief Complaint: Rash, blister-like, itchy Symptoms: Had some "chest heaviness and anxiety last night because My insurance won't pay for my oxycodone." Frequency: Symptoms started after starting Prednisone. Pertinent Negatives: Patient denies any chest pain now. Disposition: [] ED /[] Urgent Care (no appt availability in office) / [] Appointment(In office/virtual)/ []  [facility]/ [] Home Care/ [] Refused Recommended Disposition /[] [facility]/ []  Follow-up with PCP Additional Notes: Pt. Asking if she should keep taking the Prednisone. Wants Dr. Ronnald Ramp aware she has not had her oxycodone since Sunday due to "insurance issues, so I have had some anxiety." Instructed to go to ED if chest pain returns. Please advise pt. She is currently at work. May leave detailed message in phone.   Answer Assessment - Initial Assessment Questions 1. NAME of MEDICATION: "What medicine are you calling about?"     Prednisone 2. QUESTION: "What is your question?" (e.g., double dose of medicine, side effect)     Caused chest pain and rash 3. PRESCRIBING HCP: "Who prescribed it?" Reason: if prescribed by specialist, call should be referred to that group.     Dr. Ronnald Ramp 4. SYMPTOMS: "Do you have any symptoms?"     Yes 5. SEVERITY: If symptoms are present, ask "Are they mild, moderate or severe?"     Moderate 6. PREGNANCY:  "Is there any chance that you are pregnant?" "When was your last menstrual period?"     No  Protocols used: Medication Question Call-A-AH

## 2021-05-31 ENCOUNTER — Ambulatory Visit (INDEPENDENT_AMBULATORY_CARE_PROVIDER_SITE_OTHER): Payer: PRIVATE HEALTH INSURANCE | Admitting: Family Medicine

## 2021-05-31 ENCOUNTER — Other Ambulatory Visit: Payer: Self-pay

## 2021-05-31 ENCOUNTER — Encounter: Payer: Self-pay | Admitting: Family Medicine

## 2021-05-31 VITALS — BP 130/98 | HR 72 | Ht 68.0 in | Wt 191.0 lb

## 2021-05-31 DIAGNOSIS — I10 Essential (primary) hypertension: Secondary | ICD-10-CM | POA: Diagnosis not present

## 2021-05-31 DIAGNOSIS — Z8639 Personal history of other endocrine, nutritional and metabolic disease: Secondary | ICD-10-CM | POA: Diagnosis not present

## 2021-05-31 DIAGNOSIS — Z124 Encounter for screening for malignant neoplasm of cervix: Secondary | ICD-10-CM | POA: Diagnosis not present

## 2021-05-31 DIAGNOSIS — E785 Hyperlipidemia, unspecified: Secondary | ICD-10-CM | POA: Diagnosis not present

## 2021-05-31 MED ORDER — HYDROCHLOROTHIAZIDE 12.5 MG PO TABS: 12.5000 mg | ORAL_TABLET | Freq: Every day | ORAL | 0 refills | Status: DC

## 2021-05-31 NOTE — Progress Notes (Signed)
Date:  05/31/2021   Name:  Kaitlyn Ali   DOB:  03/03/1956   MRN:  [id number]   Chief Complaint: Hypertension  Hypertension This is a new problem. The current episode started 1 to 4 weeks ago. The problem has been gradually improving since onset. The problem is controlled. Pertinent negatives include no anxiety, blurred vision, chest pain, headaches, malaise/fatigue, neck pain, orthopnea, palpitations, peripheral edema, PND, shortness of breath or sweats. There are no associated agents to hypertension. Risk factors for coronary artery disease include dyslipidemia. There are no compliance problems.  There is no history of angina, kidney disease, CAD/MI, CVA, heart failure, left ventricular hypertrophy, PVD or retinopathy. There is no history of chronic renal disease, a hypertension causing med or renovascular disease.   Lab Results  Component Value Date   NA 145 (H) 10/11/2017   K 5.0 10/11/2017   CO2 25 02/16/2017   GLUCOSE 102 (H) 10/11/2017   BUN 9 10/11/2017   CREATININE 1.07 (H) 10/11/2017   CALCIUM 10.3 10/11/2017   GFRNONAA 56 (L) 10/11/2017   Lab Results  Component Value Date   CHOL 234 (H) 02/16/2017   HDL 42 02/16/2017   LDLCALC 131 (H) 02/16/2017   TRIG 306 (H) 02/16/2017   CHOLHDL 5.6 (H) 02/16/2017   No results found for: TSH No results found for: HGBA1C No results found for: WBC, HGB, HCT, MCV, PLT Lab Results  Component Value Date   AST 17 10/11/2017   ALKPHOS 70 10/11/2017   BILITOT 0.4 10/11/2017   Lab Results  Component Value Date   25OHVITD2 <1.0 10/11/2017   25OHVITD3 26 10/11/2017     Review of Systems  Constitutional:  Negative for chills, fever and malaise/fatigue.  HENT:  Negative for drooling, ear discharge, ear pain and sore throat.   Eyes:  Negative for blurred vision.  Respiratory:  Negative for cough, shortness of breath and wheezing.   Cardiovascular:  Negative for chest pain, palpitations, orthopnea, leg swelling and PND.   Gastrointestinal:  Negative for abdominal pain, blood in stool, constipation, diarrhea and nausea.  Endocrine: Negative for polydipsia.  Genitourinary:  Negative for dysuria, frequency, hematuria and urgency.  Musculoskeletal:  Negative for back pain, myalgias and neck pain.  Skin:  Negative for rash.  Allergic/Immunologic: Negative for environmental allergies.  Neurological:  Negative for dizziness and headaches.  Hematological:  Does not bruise/bleed easily.  Psychiatric/Behavioral:  Negative for suicidal ideas. The patient is not nervous/anxious.    Patient Active Problem List   Diagnosis Date Noted   Chronic pain of both knees (Secondary Area of Pain) (L>R) 10/11/2017   Chronic right shoulder pain (Primary Area of Pain) 10/11/2017   Chronic pain syndrome 10/11/2017   Long term current use of opiate analgesic 10/11/2017   Pharmacologic therapy 10/11/2017   Disorder of skeletal system 10/11/2017   Problems influencing health status 10/11/2017    Allergies  Allergen Reactions   Doxycycline Itching   Nsaids     Bothers stomach    Past Surgical History:  Procedure Laterality Date   LAPAROSCOPIC HYSTERECTOMY     TOE SURGERY     shaved bone in R) great toe   TONSILLECTOMY      Social History   Tobacco Use   Smoking status: Former   Smokeless tobacco: Never  Substance Use Topics   Alcohol use: Yes    Alcohol/week: 0.0 standard drinks   Drug use: No     Medication list has been reviewed and updated.  Current Meds  Medication Sig   diazepam (VALIUM) 5 MG tablet Take 5 mg by mouth 2 (two) times daily as needed. Dr Toy Care   diphenhydrAMINE (BENADRYL) 25 mg capsule Take 25 mg by mouth every 6 (six) hours as needed.   oxyCODONE-acetaminophen (PERCOCET) 10-325 MG per tablet Take 1 tablet by mouth every 8 (eight) hours as needed. [facility] pain management   triamcinolone cream (KENALOG) 0.1 % APPLY TO AFFECTED AREA TWICE A DAY    PHQ 2/9 Scores 04/19/2021 10/04/2018 03/14/2018  10/25/2017  PHQ - 2 Score 0 1 0 0  PHQ- 9 Score - 1 0 -    No flowsheet data found.  BP Readings from Last 3 Encounters:  05/31/21 (!) 130/98  04/19/21 (!) 152/98  05/28/18 (!) 138/102    Physical Exam HENT:     Right Ear: Tympanic membrane, ear canal and external ear normal. There is no impacted cerumen.     Left Ear: Tympanic membrane, ear canal and external ear normal. There is no impacted cerumen.  Neck:     Vascular: No carotid bruit.  Cardiovascular:     Heart sounds: No murmur heard.   No gallop.  Pulmonary:     Breath sounds: No wheezing.  Lymphadenopathy:     Cervical: No cervical adenopathy.    Wt Readings from Last 3 Encounters:  05/31/21 191 lb (86.6 kg)  04/19/21 179 lb (81.2 kg)  05/28/18 173 lb (78.5 kg)    BP (!) 130/98    Pulse 72    Ht 5\' 8"  (1.727 m)    Wt 191 lb (86.6 kg)    BMI 29.04 kg/m   Assessment and Plan:  1. Primary hypertension Relatively new onset.  Persistent.  For readings have all had about the same elevated blood pressure readings.  So dietary approach is not going to be sufficient and we will begin patient on hydrochlorothiazide 12.5 mg once a day.  We will recheck patient in 6 weeks at which time we will do labs and have encouraged her to come fasting so that we can also check cholesterol - hydrochlorothiazide (HYDRODIURIL) 12.5 MG tablet; Take 1 tablet (12.5 mg total) by mouth daily.  Dispense: 90 tablet; Refill: 0  2. Dyslipidemia Review of patient's previous lab work notes that there is elevated cholesterol triglyceride readings in the past.  Patient has been given guidelines for reduction of cholesterol and lipids and patient will return in fasting state in 6 weeks at which time we will obtain a lipid panel as well.  3. Cervical cancer screening Review of previous would suggest that patient needs a cervical cancer screening and we will refer to gynecology for discussion and proceeding with evaluation as well as scheduling for  mammograms. - Ambulatory referral to Gynecology  4. History of hyperlipidemia As noted above patient 5 to 6 years ago had elevated lipid readings and we will proceed with further evaluation but in preparation patient has been given low-cholesterol guidelines for reductions to begin with controlled intake of lipids and triglycerides.

## 2021-05-31 NOTE — Patient Instructions (Signed)

## 2021-06-02 ENCOUNTER — Telehealth: Payer: Self-pay

## 2021-06-02 NOTE — Telephone Encounter (Signed)
[facility] referring for pap and pelvic/ last one?. Please sch w/ [facility] or CNM, [place] this month is okay as long as the patient is okay with future appointments at [facility]. Called and left voicemail for patient to call back to be scheduled.

## 2021-06-08 NOTE — Telephone Encounter (Signed)
Called and left voicemail for patient to call back to be scheduled. 

## 2021-06-10 NOTE — Telephone Encounter (Signed)
Pt is scheduled with [facility] on 3/13.  ?

## 2021-06-14 ENCOUNTER — Other Ambulatory Visit (HOSPITAL_COMMUNITY)
Admission: RE | Admit: 2021-06-14 | Discharge: 2021-06-14 | Disposition: A | Discharge status: Home / Self Care | Payer: Self-pay | Source: Ambulatory Visit | Attending: Obstetrics and Gynecology | Admitting: Obstetrics and Gynecology

## 2021-06-14 ENCOUNTER — Ambulatory Visit (INDEPENDENT_AMBULATORY_CARE_PROVIDER_SITE_OTHER): Payer: PRIVATE HEALTH INSURANCE | Admitting: Obstetrics and Gynecology

## 2021-06-14 ENCOUNTER — Other Ambulatory Visit: Payer: Self-pay

## 2021-06-14 ENCOUNTER — Encounter: Payer: Self-pay | Admitting: Obstetrics and Gynecology

## 2021-06-14 VITALS — BP 120/80 | Ht 68.0 in | Wt 190.0 lb

## 2021-06-14 DIAGNOSIS — Z124 Encounter for screening for malignant neoplasm of cervix: Secondary | ICD-10-CM

## 2021-06-14 DIAGNOSIS — Z1231 Encounter for screening mammogram for malignant neoplasm of breast: Secondary | ICD-10-CM

## 2021-06-14 DIAGNOSIS — Z803 Family history of malignant neoplasm of breast: Secondary | ICD-10-CM

## 2021-06-14 DIAGNOSIS — Z01419 Encounter for gynecological examination (general) (routine) without abnormal findings: Secondary | ICD-10-CM | POA: Diagnosis not present

## 2021-06-14 DIAGNOSIS — Z1151 Encounter for screening for human papillomavirus (HPV): Secondary | ICD-10-CM | POA: Insufficient documentation

## 2021-06-14 NOTE — Patient Instructions (Signed)
I value your feedback and you entrusting us with your care. If you get a [facility] patient survey, I would appreciate you taking the time to let us know about your experience today. Thank you! ? ? ?

## 2021-06-14 NOTE — Progress Notes (Signed)
? ?PCP: Juline Patch, MD ? ? ?Chief Complaint  ?Patient presents with  ? Referral  ?  Pap only  ? Gynecologic Exam  ? ? ?HPI: ?     Ms. Kaitlyn Ali is a 66 y.o. No obstetric history on file. whose LMP was No LMP recorded. Patient has had a hysterectomy., presents today for her NP > 3 yrs annual, referred by PCP.  Her menses are absent due to hyst; no PMB. S/p lap supracx hyst with Dr. Davis Gourd due to AUB. She has occas vasomotor sx.  ? ?Sex activity: single partner, contraception - status post hysterectomy; STILL HAS CX. She does not have vaginal dryness. ? ?Last Pap: 08/15/13 Results were: no abnormalities /neg HPV DNA. No hx of abn paps with tx. ? ?Last mammogram: 08/15/13  Results were: normal--routine follow-up in 12 months ?There is a FH of breast cancer in her sister who is BRCA neg; Pt doesn't qualify for cancer genetic testing. There is no FH of ovarian cancer. The patient does do self-breast exams. ? ?Colonoscopy: few yrs ago, ordered by PCP; Repeat due after 7years.  ? ?Tobacco use: The patient denies current or previous tobacco use. ?Alcohol use: none ?No drug use ?Exercise: min active ? ?She does get adequate calcium but not Vitamin D in her diet. ? ?Labs with PCP. ? ? ?Patient Active Problem List  ? Diagnosis Date Noted  ? Family history of breast cancer 06/14/2021  ? Chronic pain of both knees (Secondary Area of Pain) (L>R) 10/11/2017  ? Chronic right shoulder pain (Primary Area of Pain) 10/11/2017  ? Chronic pain syndrome 10/11/2017  ? Long term current use of opiate analgesic 10/11/2017  ? Pharmacologic therapy 10/11/2017  ? Disorder of skeletal system 10/11/2017  ? Problems influencing health status 10/11/2017  ? ? ?Past Surgical History:  ?Procedure Laterality Date  ? LAPAROSCOPIC HYSTERECTOMY    ? supracervical hyst due to AUB; Dr. Davis Gourd  ? TOE SURGERY    ? shaved bone in R) great toe  ? TONSILLECTOMY    ? ? ?Family History  ?Problem Relation Age of Onset  ? Diabetes Mother   ?  Liver cancer Father   ? Breast cancer Sister 48  ?     NEG BRCA testing  ? ? ?Social History  ? ?Socioeconomic History  ? Marital status: Single  ?  Spouse name: Not on file  ? Number of children: Not on file  ? Years of education: Not on file  ? Highest education level: Not on file  ?Occupational History  ? Not on file  ?Tobacco Use  ? Smoking status: Former  ? Smokeless tobacco: Never  ?Vaping Use  ? Vaping Use: Never used  ?Substance and Sexual Activity  ? Alcohol use: Yes  ?  Alcohol/week: 0.0 standard drinks  ? Drug use: No  ? Sexual activity: Yes  ?Other Topics Concern  ? Not on file  ?Social History Narrative  ? Not on file  ? ?Social Determinants of Health  ? ?Financial Resource Strain: Not on file  ?Food Insecurity: Not on file  ?Transportation Needs: Not on file  ?Physical Activity: Not on file  ?Stress: Not on file  ?Social Connections: Not on file  ?Intimate Partner Violence: Not on file  ? ? ? ?Current Outpatient Medications:  ?  buPROPion (WELLBUTRIN XL) 150 MG 24 hr tablet, Take 150 mg by mouth every morning., Disp: , Rfl:  ?  diazepam (VALIUM) 5 MG tablet, Take 5 mg  by mouth 2 (two) times daily as needed. Dr Toy Care, Disp: , Rfl: 1 ?  diphenhydrAMINE (BENADRYL) 25 mg capsule, Take 25 mg by mouth every 6 (six) hours as needed., Disp: , Rfl:  ?  hydrochlorothiazide (HYDRODIURIL) 12.5 MG tablet, Take 1 tablet (12.5 mg total) by mouth daily., Disp: 90 tablet, Rfl: 0 ?  neomycin-polymyxin b-dexamethasone (MAXITROL) 3.5-10000-0.1 OINT, , Disp: , Rfl:  ?  neomycin-polymyxin b-dexamethasone (MAXITROL) 3.5-10000-0.1 SUSP, SMARTSIG:In Eye(s), Disp: , Rfl:  ?  oxyCODONE-acetaminophen (PERCOCET) 10-325 MG per tablet, Take 1 tablet by mouth every 8 (eight) hours as needed. [facility] pain management, Disp: , Rfl: 0 ? ? ? ? ?ROS: ? ?Review of Systems  ?Constitutional:  Negative for fatigue, fever and unexpected weight change.  ?Respiratory:  Negative for cough, shortness of breath and wheezing.   ?Cardiovascular:  Negative  for chest pain, palpitations and leg swelling.  ?Gastrointestinal:  Negative for blood in stool, constipation, diarrhea, nausea and vomiting.  ?Endocrine: Negative for cold intolerance, heat intolerance and polyuria.  ?Genitourinary:  Negative for dyspareunia, dysuria, flank pain, frequency, genital sores, hematuria, menstrual problem, pelvic pain, urgency, vaginal bleeding, vaginal discharge and vaginal pain.  ?Musculoskeletal:  Positive for arthralgias. Negative for back pain, joint swelling and myalgias.  ?Skin:  Negative for rash.  ?Neurological:  Negative for dizziness, syncope, light-headedness, numbness and headaches.  ?Hematological:  Negative for adenopathy.  ?Psychiatric/Behavioral:  Negative for agitation, confusion, sleep disturbance and suicidal ideas. The patient is not nervous/anxious.   ?BREAST: No symptoms ? ? ? ?Objective: ?BP 120/80   Ht '5\' 8"'  (1.727 m)   Wt 190 lb (86.2 kg)   BMI 28.89 kg/m?  ? ? ?Physical Exam ?Constitutional:   ?   Appearance: She is well-developed.  ?Genitourinary:  ?   Vulva normal.  ?   Genitourinary Comments: UTERUS SURG ABSENT  ?   Right Labia: No rash, tenderness or lesions. ?   Left Labia: No tenderness, lesions or rash. ?   No vaginal discharge, erythema or tenderness.  ? ?   Right Adnexa: not tender and no mass present. ?   Left Adnexa: not tender and no mass present. ?   No cervical friability or polyp.  ?   Uterus is not tender.  ?   Uterus is absent.  ?Breasts: ?   Right: No mass, nipple discharge, skin change or tenderness.  ?   Left: No mass, nipple discharge, skin change or tenderness.  ?Neck:  ?   Thyroid: No thyromegaly.  ?Cardiovascular:  ?   Rate and Rhythm: Normal rate and regular rhythm.  ?   Heart sounds: Normal heart sounds. No murmur heard. ?Pulmonary:  ?   Effort: Pulmonary effort is normal.  ?   Breath sounds: Normal breath sounds.  ?Abdominal:  ?   Palpations: Abdomen is soft.  ?   Tenderness: There is no abdominal tenderness. There is no guarding  or rebound.  ?Musculoskeletal:     ?   General: Normal range of motion.  ?   Cervical back: Normal range of motion.  ?Lymphadenopathy:  ?   Cervical: No cervical adenopathy.  ?Neurological:  ?   General: No focal deficit present.  ?   Mental Status: She is alert and oriented to person, place, and time.  ?   Cranial Nerves: No cranial nerve deficit.  ?Skin: ?   General: Skin is warm and dry.  ?Psychiatric:     ?   Mood and Affect: Mood normal.     ?  Behavior: Behavior normal.     ?   Thought Content: Thought content normal.     ?   Judgment: Judgment normal.  ?Vitals reviewed.  ? ?Assessment/Plan: ? ?Encounter for annual routine gynecological examination ? ?Cervical cancer screening - Plan: Cytology - PAP ? ?Screening for HPV (human papillomavirus) - Plan: Cytology - PAP ? ?Encounter for screening mammogram for malignant neoplasm of breast - Plan: MM 3D SCREEN BREAST BILATERAL; pt to schedule mammo ? ?Family history of breast cancer--pt's sister is BRCA neg. Pt doesn't meet current testing guidelines. Will cont to follow FH.  ? ?       ?GYN counsel breast self exam, mammography screening, menopause, adequate intake of calcium and vitamin D, diet and exercise ? ?  F/U ? Return in about 1 year (around 06/15/2022). ? ?Merlen Gurry B. Honest Safranek, PA-C ?06/14/2021 ?10:01 AM ?

## 2021-06-16 LAB — CYTOLOGY - PAP
Comment: NEGATIVE
Diagnosis: UNDETERMINED — AB
High risk HPV: POSITIVE — AB

## 2021-07-12 ENCOUNTER — Ambulatory Visit: Payer: PRIVATE HEALTH INSURANCE | Admitting: Family Medicine

## 2021-07-15 ENCOUNTER — Encounter: Payer: Self-pay | Admitting: Family Medicine

## 2021-07-15 ENCOUNTER — Telehealth: Payer: Self-pay

## 2021-07-15 ENCOUNTER — Ambulatory Visit (INDEPENDENT_AMBULATORY_CARE_PROVIDER_SITE_OTHER): Payer: Medicare HMO | Admitting: Family Medicine

## 2021-07-15 VITALS — BP 128/70 | HR 72 | Ht 68.0 in | Wt 190.0 lb

## 2021-07-15 DIAGNOSIS — I10 Essential (primary) hypertension: Secondary | ICD-10-CM

## 2021-07-15 DIAGNOSIS — Z780 Asymptomatic menopausal state: Secondary | ICD-10-CM

## 2021-07-15 DIAGNOSIS — Z6828 Body mass index (BMI) 28.0-28.9, adult: Secondary | ICD-10-CM

## 2021-07-15 DIAGNOSIS — E785 Hyperlipidemia, unspecified: Secondary | ICD-10-CM | POA: Diagnosis not present

## 2021-07-15 MED ORDER — HYDROCHLOROTHIAZIDE 12.5 MG PO TABS: 12.5000 mg | ORAL_TABLET | Freq: Every day | ORAL | 0 refills | Status: DC

## 2021-07-15 NOTE — Patient Instructions (Signed)
Mediterranean Diet ?A Mediterranean diet refers to food and lifestyle choices that are based on the traditions of countries located on the [place]. It focuses on eating more fruits, vegetables, whole grains, beans, nuts, seeds, and heart-healthy fats, and eating less dairy, meat, eggs, and processed foods with added sugar, salt, and fat. This way of eating has been shown to help prevent certain conditions and improve outcomes for people who have chronic diseases, like kidney disease and heart disease. ?What are tips for following this plan? ?Reading food labels ?Check the serving size of packaged foods. For foods such as rice and pasta, the serving size refers to the amount of cooked product, not dry. ?Check the total fat in packaged foods. Avoid foods that have saturated fat or trans fats. ?Check the ingredient list for added sugars, such as corn syrup. ?Shopping ? ?Buy a variety of foods that offer a balanced diet, including: ?Fresh fruits and vegetables (produce). ?Grains, beans, nuts, and seeds. Some of these may be available in unpackaged forms or large amounts (in bulk). ?Fresh seafood. ?Poultry and eggs. ?Low-fat dairy products. ?Buy whole ingredients instead of prepackaged foods. ?Buy fresh fruits and vegetables in-season from local farmers markets. ?Buy plain frozen fruits and vegetables. ?If you do not have access to quality fresh seafood, buy precooked frozen shrimp or canned fish, such as tuna, salmon, or sardines. ?Stock your pantry so you always have certain foods on hand, such as olive oil, canned tuna, canned tomatoes, rice, pasta, and beans. ?Cooking ?Cook foods with extra-virgin olive oil instead of using butter or other vegetable oils. ?Have meat as a side dish, and have vegetables or grains as your main dish. This means having meat in small portions or adding small amounts of meat to foods like pasta or stew. ?Use beans or vegetables instead of meat in common dishes like chili or  lasagna. ?Experiment with different cooking methods. Try roasting, broiling, steaming, and saut?ing vegetables. ?Add frozen vegetables to soups, stews, pasta, or rice. ?Add nuts or seeds for added healthy fats and plant protein at each meal. You can add these to yogurt, salads, or vegetable dishes. ?Marinate fish or vegetables using olive oil, lemon juice, garlic, and fresh herbs. ?Meal planning ?Plan to eat one vegetarian meal one day each week. Try to work up to two vegetarian meals, if possible. ?Eat seafood two or more times a week. ?Have healthy snacks readily available, such as: ?Vegetable sticks with hummus. ?Greek yogurt. ?Fruit and nut trail mix. ?Eat balanced meals throughout the week. This includes: ?Fruit: 2-3 servings a day. ?Vegetables: 4-5 servings a day. ?Low-fat dairy: 2 servings a day. ?Fish, poultry, or lean meat: 1 serving a day. ?Beans and legumes: 2 or more servings a week. ?Nuts and seeds: 1-2 servings a day. ?Whole grains: 6-8 servings a day. ?Extra-virgin olive oil: 3-4 servings a day. ?Limit red meat and sweets to only a few servings a month. ?Lifestyle ? ?Cook and eat meals together with your family, when possible. ?Drink enough fluid to keep your urine pale yellow. ?Be physically active every day. This includes: ?Aerobic exercise like running or swimming. ?Leisure activities like gardening, walking, or housework. ?Get 7-8 hours of sleep each night. ?If recommended by your health care provider, drink red wine in moderation. This means 1 glass a day for nonpregnant women and 2 glasses a day for men. A glass of wine equals 5 oz (150 mL). ?What foods should I eat? ?Fruits ?Apples. Apricots. Avocado. Berries. Bananas. Cherries. Dates.   Figs. Grapes. Lemons. Melon. Oranges. Peaches. Plums. Pomegranate. ?Vegetables ?Artichokes. Beets. Broccoli. Cabbage. Carrots. Eggplant. Green beans. Chard. Kale. Spinach. Onions. Leeks. Peas. Squash. Tomatoes. Peppers. Radishes. ?Grains ?Whole-grain pasta. Brown  rice. Bulgur wheat. Polenta. Couscous. Whole-wheat bread. [place]. ?Meats and other proteins ?Beans. Almonds. Sunflower seeds. Pine nuts. Peanuts. [place]. Salmon. Scallops. Shrimp. [place]. Tilapia. Clams. Oysters. Eggs. Poultry without skin. ?Dairy ?Low-fat milk. Cheese. Greek yogurt. ?Fats and oils ?Extra-virgin olive oil. Avocado oil. Grapeseed oil. ?Beverages ?Water. Red wine. Herbal tea. ?Sweets and desserts ?Greek yogurt with honey. Baked apples. Poached pears. Trail mix. ?Seasonings and condiments ?Basil. Cilantro. Coriander. Cumin. Mint. Parsley. Sage. Rosemary. Tarragon. Garlic. Oregano. Thyme. Pepper. Balsamic vinegar. Tahini. Hummus. Tomato sauce. Olives. Mushrooms. ?The items listed above may not be a complete list of foods and beverages you can eat. Contact a dietitian for more information. ?What foods should I limit? ?This is a list of foods that should be eaten rarely or only on special occasions. ?Fruits ?Fruit canned in syrup. ?Vegetables ?Deep-fried potatoes (french fries). ?Grains ?Prepackaged pasta or rice dishes. Prepackaged cereal with added sugar. Prepackaged snacks with added sugar. ?Meats and other proteins ?Beef. Pork. Lamb. Poultry with skin. Hot dogs. [place] Sal[place]. ?Dairy ?Ice cream. Sour cream. Whole milk. ?Fats and oils ?Butter. Canola oil. Vegetable oil. Beef fat (tallow). Lard. ?Beverages ?Juice. Sugar-sweetened soft drinks. Beer. Liquor and spirits. ?Sweets and desserts ?Cookies. Cakes. Pies. Candy. ?Seasonings and condiments ?Mayonnaise. Pre-made sauces and marinades. ?The items listed above may not be a complete list of foods and beverages you should limit. Contact a dietitian for more information. ?Summary ?The Mediterranean diet includes both food and lifestyle choices. ?Eat a variety of fresh fruits and vegetables, beans, nuts, seeds, and whole grains. ?Limit the amount of red meat and sweets that you eat. ?If recommended by your health care provider, drink red wine in moderation.  This means 1 glass a day for nonpregnant women and 2 glasses a day for men. A glass of wine equals 5 oz (150 mL). ?This information is not intended to replace advice given to you by your health care provider. Make sure you discuss any questions you have with your health care provider. ?Document Revised: 04/26/2019 Document Reviewed: 02/21/2019 ?Elsevier Patient Education ? [place]  ?LOW-CHOLESTEROL, LOW-TRIGLYCERIDE DIETS  ?  ?FOODS TO USE  ? ?MEATS, FISH Choose lean meats (chicken, [place], veal, and non-fatty cuts of beef with excess fat trimmed; one serving = 3 oz of cooked meat). Also, fresh or frozen fish, canned fish packed in water, and shellfish (lobster, crabs, shrimp, and oysters). Limit use to no more than one serving of one of these per week. Shellfish are high in cholesterol but low in saturated fat and should be used sparingly. Meats and fish should be broiled (pan or oven) or baked on a rack.  ?EGGS Egg substitutes and egg whites (use freely). Egg yolks (limit two per week).  ?FRUITS Eat three servings of fresh fruit per day (1 serving = ? cup). Be sure to have at least one citrus fruit daily. Frozen and canned fruit with no sugar or syrup added may be used.  ?VEGETABLES Most vegetables are not limited (see next page). One dark-green (string beans, escarole) or one deep yellow (squash) vegetable is recommended daily. Cauliflower, broccoli, and celery, as well as potato skins, are recommended for their fiber content. (Fiber is associated with cholesterol reduction) It is preferable to steam vegetables, but they may be boiled, strained, or braised with polyunsaturated  vegetable oil (see below).  ?BEANS Dried peas or beans (1 serving = ? cup) may be used as a bread substitute.  ?NUTS Almonds, walnuts, and peanuts may be used sparingly  ?(1 serving = 1 Tablespoonful). Use pumpkin, sesame, or sunflower seeds.  ?BREADS, GRAINS One roll or one slice of whole grain or enriched bread  may be used, or three soda crackers or four pieces of melba toast as a substitute. Spaghetti, rice or noodles (? cup) or ? large ear of corn may be used as a bread substitute. In preparing these foods do not

## 2021-07-15 NOTE — Telephone Encounter (Signed)
Called pt with bone density appt- on 07/26/2021 @ 200 in [place] ?

## 2021-07-15 NOTE — Progress Notes (Signed)
[facility] ? ? ?Date:  07/15/2021  ? ?Name:  Kaitlyn Ali   DOB:  Oct 18, 1955   MRN:  [id number] ? ? ?Chief Complaint: Hypertension ? ?Hypertension ?This is a chronic problem. The current episode started more than 1 year ago. The problem has been gradually improving since onset. The problem is controlled. Pertinent negatives include no anxiety, blurred vision, chest pain, headaches, malaise/fatigue, neck pain, orthopnea, palpitations, peripheral edema, PND, shortness of breath or sweats.  ? ?Lab Results  ?Component Value Date  ? NA 145 (H) 10/11/2017  ? K 5.0 10/11/2017  ? CO2 25 02/16/2017  ? GLUCOSE 102 (H) 10/11/2017  ? BUN 9 10/11/2017  ? CREATININE 1.07 (H) 10/11/2017  ? CALCIUM 10.3 10/11/2017  ? GFRNONAA 56 (L) 10/11/2017  ? ?Lab Results  ?Component Value Date  ? CHOL 234 (H) 02/16/2017  ? HDL 42 02/16/2017  ? LDLCALC 131 (H) 02/16/2017  ? TRIG 306 (H) 02/16/2017  ? CHOLHDL 5.6 (H) 02/16/2017  ? ?No results found for: TSH ?No results found for: HGBA1C ?No results found for: WBC, HGB, HCT, MCV, PLT ?Lab Results  ?Component Value Date  ? AST 17 10/11/2017  ? ALKPHOS 70 10/11/2017  ? BILITOT 0.4 10/11/2017  ? ?Lab Results  ?Component Value Date  ? 93JQZESP2 <1.0 10/11/2017  ? 25OHVITD3 26 10/11/2017  ?  ? ?Review of Systems  ?Constitutional:  Negative for chills, fever and malaise/fatigue.  ?HENT:  Negative for drooling, ear discharge, ear pain and sore throat.   ?Eyes:  Negative for blurred vision.  ?Respiratory:  Negative for cough, shortness of breath and wheezing.   ?Cardiovascular:  Negative for chest pain, palpitations, orthopnea, leg swelling and PND.  ?Gastrointestinal:  Negative for abdominal pain, blood in stool, constipation, diarrhea and nausea.  ?Endocrine: Negative for polydipsia.  ?Genitourinary:  Negative for dysuria, frequency, hematuria and urgency.  ?Musculoskeletal:  Negative for back pain, myalgias and neck pain.  ?Skin:  Negative for rash.  ?Allergic/Immunologic: Negative for environmental  allergies.  ?Neurological:  Negative for dizziness and headaches.  ?Hematological:  Does not bruise/bleed easily.  ?Psychiatric/Behavioral:  Negative for suicidal ideas. The patient is not nervous/anxious.   ? ?Patient Active Problem List  ? Diagnosis Date Noted  ? Family history of breast cancer 06/14/2021  ? Chronic pain of both knees (Secondary Area of Pain) (L>R) 10/11/2017  ? Chronic right shoulder pain (Primary Area of Pain) 10/11/2017  ? Chronic pain syndrome 10/11/2017  ? Long term current use of opiate analgesic 10/11/2017  ? Pharmacologic therapy 10/11/2017  ? Disorder of skeletal system 10/11/2017  ? Problems influencing health status 10/11/2017  ? ? ?Allergies  ?Allergen Reactions  ? Doxycycline Itching  ? Nsaids   ?  Bothers stomach  ? ? ?Past Surgical History:  ?Procedure Laterality Date  ? LAPAROSCOPIC HYSTERECTOMY    ? supracervical hyst due to AUB; Dr. Davis Gourd  ? TOE SURGERY    ? shaved bone in R) great toe  ? TONSILLECTOMY    ? ? ?Social History  ? ?Tobacco Use  ? Smoking status: Former  ? Smokeless tobacco: Never  ?Vaping Use  ? Vaping Use: Never used  ?Substance Use Topics  ? Alcohol use: Yes  ?  Alcohol/week: 0.0 standard drinks  ? Drug use: No  ? ? ? ?Medication list has been reviewed and updated. ? ?Current Meds  ?Medication Sig  ? diazepam (VALIUM) 5 MG tablet Take 5 mg by mouth 2 (two) times daily as needed. Dr Toy Care  ?  diphenhydrAMINE (BENADRYL) 25 mg capsule Take 25 mg by mouth every 6 (six) hours as needed.  ? hydrochlorothiazide (HYDRODIURIL) 12.5 MG tablet Take 1 tablet (12.5 mg total) by mouth daily.  ? neomycin-polymyxin b-dexamethasone (MAXITROL) 3.5-10000-0.1 OINT   ? neomycin-polymyxin b-dexamethasone (MAXITROL) 3.5-10000-0.1 SUSP SMARTSIG:In Eye(s)  ? oxyCODONE-acetaminophen (PERCOCET) 10-325 MG per tablet Take 1 tablet by mouth every 8 (eight) hours as needed. [facility] pain [facility]  ? ? ? ?  07/15/2021  ?  8:53 AM  ?GAD 7 : Generalized Anxiety Score  ?Nervous, Anxious, on Edge 0   ?Control/stop worrying 0  ?Worry too much - different things 0  ?Trouble relaxing 0  ?Restless 0  ?Easily annoyed or irritable 0  ?Afraid - awful might happen 0  ?Total GAD 7 Score 0  ?Anxiety Difficulty Not difficult at all  ? ? ? ?  07/15/2021  ?  8:52 AM  ?Depression screen PHQ 2/9  ?Decreased Interest 0  ?Down, Depressed, Hopeless 0  ?PHQ - 2 Score 0  ?Altered sleeping 0  ?Tired, decreased energy 0  ?Change in appetite 0  ?Feeling bad or failure about yourself  0  ?Trouble concentrating 0  ?Moving slowly or fidgety/restless 0  ?Suicidal thoughts 0  ?PHQ-9 Score 0  ?Difficult doing work/chores Not difficult at all  ? ? ?BP Readings from Last 3 Encounters:  ?07/15/21 128/70  ?06/14/21 120/80  ?05/31/21 (!) 130/98  ? ? ?Physical Exam ?Vitals and nursing note reviewed.  ?Constitutional:   ?   Appearance: She is well-developed.  ?HENT:  ?   Head: Normocephalic.  ?   Right Ear: External ear normal.  ?   Left Ear: External ear normal.  ?Eyes:  ?   General: Lids are everted, no foreign bodies appreciated. No scleral icterus.    ?   Left eye: No foreign body or hordeolum.  ?   Conjunctiva/sclera: Conjunctivae normal.  ?   Right eye: Right conjunctiva is not injected.  ?   Left eye: Left conjunctiva is not injected.  ?   Pupils: Pupils are equal, round, and reactive to light.  ?Neck:  ?   Thyroid: No thyromegaly.  ?   Vascular: No JVD.  ?   Trachea: No tracheal deviation.  ?Cardiovascular:  ?   Rate and Rhythm: Normal rate and regular rhythm.  ?   Pulses: Normal pulses.  ?   Heart sounds: Normal heart sounds, S1 normal and S2 normal. No murmur heard. ?No systolic murmur is present.  ?No diastolic murmur is present.  ?  No friction rub. No gallop. No S3 or S4 sounds.  ?Pulmonary:  ?   Effort: Pulmonary effort is normal. No respiratory distress.  ?   Breath sounds: Normal breath sounds. No wheezing, rhonchi or rales.  ?Abdominal:  ?   General: Bowel sounds are normal.  ?   Palpations: Abdomen is soft. There is no mass.  ?    Tenderness: There is no abdominal tenderness. There is no guarding or rebound.  ?Musculoskeletal:     ?   General: No tenderness. Normal range of motion.  ?   Cervical back: Normal range of motion and neck supple.  ?Lymphadenopathy:  ?   Cervical: No cervical adenopathy.  ?Skin: ?   General: Skin is warm.  ?   Findings: No rash.  ?Neurological:  ?   Mental Status: She is alert and oriented to person, place, and time.  ?   Cranial Nerves: No cranial nerve deficit.  ?  Deep Tendon Reflexes: Reflexes normal.  ?Psychiatric:     ?   Mood and Affect: Mood is not anxious or depressed.  ? ? ?Wt Readings from Last 3 Encounters:  ?07/15/21 190 lb (86.2 kg)  ?06/14/21 190 lb (86.2 kg)  ?05/31/21 191 lb (86.6 kg)  ? ? ?BP 128/70   Pulse 72   Ht '5\' 8"'$  (1.727 m)   Wt 190 lb (86.2 kg)   BMI 28.89 kg/m?  ? ?Assessment and Plan: ? ?1. Primary hypertension ?Chronic.  Controlled.  Stable.  Blood pressure is 128/70.  Continue hydrochlorothiazide 12.5 mg once a day.  Will check renal function panel for current status of electrolytes and GFR ?- hydrochlorothiazide (HYDRODIURIL) 12.5 MG tablet; Take 1 tablet (12.5 mg total) by mouth daily.  Dispense: 90 tablet; Refill: 0 ?- Renal Function Panel ? ?2. Dyslipidemia ?Chronic.  Controlled.  Stable.  Patient has had elevated triglycerides and LDL in the past.  We have discussed weight loss and she is looking at joining [employer].  Patient has been given dietary guidelines.  We will check lipid panel and progress accordingly. ?- Lipid Panel With LDL/HDL Ratio ? ?3. Menopause ?Patient history of menopause and we will check bone density. ?- DG Bone Density; Future ? ?4. BMI 28.0-28.9,adult ?Chronic.  Present weight is in this range.  We have discussed limiting calories modifying to a Mediterranean diet and with anticipation of elevated cholesterol patient has been given triglyceride and cholesterol guidelines.Health risks of being over weight were discussed and patient was counseled on  weight loss options and exercise.  ? ? ?

## 2021-07-16 ENCOUNTER — Telehealth: Payer: Self-pay

## 2021-07-16 ENCOUNTER — Other Ambulatory Visit: Payer: Self-pay

## 2021-07-16 DIAGNOSIS — Z79891 Long term (current) use of opiate analgesic: Secondary | ICD-10-CM | POA: Diagnosis not present

## 2021-07-16 DIAGNOSIS — M25562 Pain in left knee: Secondary | ICD-10-CM | POA: Diagnosis not present

## 2021-07-16 DIAGNOSIS — M25571 Pain in right ankle and joints of right foot: Secondary | ICD-10-CM | POA: Diagnosis not present

## 2021-07-16 DIAGNOSIS — M25552 Pain in left hip: Secondary | ICD-10-CM | POA: Diagnosis not present

## 2021-07-16 DIAGNOSIS — E785 Hyperlipidemia, unspecified: Secondary | ICD-10-CM

## 2021-07-16 DIAGNOSIS — M25551 Pain in right hip: Secondary | ICD-10-CM | POA: Diagnosis not present

## 2021-07-16 DIAGNOSIS — G894 Chronic pain syndrome: Secondary | ICD-10-CM | POA: Diagnosis not present

## 2021-07-16 DIAGNOSIS — M25511 Pain in right shoulder: Secondary | ICD-10-CM | POA: Diagnosis not present

## 2021-07-16 LAB — LIPID PANEL WITH LDL/HDL RATIO
Cholesterol, Total: 234 mg/dL — ABNORMAL HIGH (ref 100–199)
HDL: 43 mg/dL (ref >39)
LDL Chol Calc (NIH): 152 mg/dL — ABNORMAL HIGH (ref 0–99)
LDL/HDL Ratio: 3.5 ratio — ABNORMAL HIGH (ref 0.0–3.2)
Triglycerides: 211 mg/dL — ABNORMAL HIGH (ref 0–149)
VLDL Cholesterol Cal: 39 mg/dL (ref 5–40)

## 2021-07-16 LAB — RENAL FUNCTION PANEL
Albumin: 4.6 g/dL (ref 3.8–4.8)
BUN/Creatinine Ratio: 16 (ref 12–28)
BUN: 16 mg/dL (ref 8–27)
CO2: 25 mmol/L (ref 20–29)
Calcium: 10.2 mg/dL (ref 8.7–10.3)
Chloride: 99 mmol/L (ref 96–106)
Creatinine, Ser: 1.03 mg/dL — ABNORMAL HIGH (ref 0.57–1.00)
Glucose: 100 mg/dL — ABNORMAL HIGH (ref 70–99)
Phosphorus: 3.8 mg/dL (ref 3.0–4.3)
Potassium: 4.8 mmol/L (ref 3.5–5.2)
Sodium: 140 mmol/L (ref 134–144)
eGFR: 60 mL/min/{1.73_m2} (ref >59)

## 2021-07-16 MED ORDER — ATORVASTATIN CALCIUM 10 MG PO TABS: 10.0000 mg | ORAL_TABLET | Freq: Every day | ORAL | 1 refills | Status: DC

## 2021-07-16 NOTE — Telephone Encounter (Signed)
Spoke to pt concerning labs- started Atorvastatin '10mg'$  qday #30 with 1 refill. Sent in to [facility]- recheck in 6-8 weeks ?

## 2021-07-23 ENCOUNTER — Ambulatory Visit
Admission: RE | Admit: 2021-07-23 | Discharge: 2021-07-23 | Disposition: A | Discharge status: Home / Self Care | Payer: Medicare HMO | Source: Ambulatory Visit | Attending: Obstetrics and Gynecology | Admitting: Obstetrics and Gynecology

## 2021-07-23 DIAGNOSIS — Z1231 Encounter for screening mammogram for malignant neoplasm of breast: Secondary | ICD-10-CM | POA: Diagnosis not present

## 2021-07-26 ENCOUNTER — Other Ambulatory Visit: Payer: Self-pay

## 2021-07-28 ENCOUNTER — Inpatient Hospital Stay
Admission: RE | Admit: 2021-07-28 | Discharge: 2021-07-28 | Disposition: A | Discharge status: Home / Self Care | Payer: Self-pay | Source: Ambulatory Visit | Attending: *Deleted | Admitting: *Deleted

## 2021-07-28 ENCOUNTER — Other Ambulatory Visit: Payer: Self-pay | Admitting: *Deleted

## 2021-07-28 DIAGNOSIS — Z1231 Encounter for screening mammogram for malignant neoplasm of breast: Secondary | ICD-10-CM

## 2021-07-29 ENCOUNTER — Other Ambulatory Visit: Payer: Self-pay | Admitting: *Deleted

## 2021-07-29 ENCOUNTER — Inpatient Hospital Stay
Admission: RE | Admit: 2021-07-29 | Discharge: 2021-07-29 | Disposition: A | Discharge status: Home / Self Care | Payer: Self-pay | Source: Ambulatory Visit | Attending: *Deleted | Admitting: *Deleted

## 2021-07-29 DIAGNOSIS — Z1231 Encounter for screening mammogram for malignant neoplasm of breast: Secondary | ICD-10-CM

## 2021-08-06 ENCOUNTER — Other Ambulatory Visit: Payer: Self-pay | Admitting: Obstetrics and Gynecology

## 2021-08-06 DIAGNOSIS — R928 Other abnormal and inconclusive findings on diagnostic imaging of breast: Secondary | ICD-10-CM

## 2021-08-06 DIAGNOSIS — R921 Mammographic calcification found on diagnostic imaging of breast: Secondary | ICD-10-CM

## 2021-08-06 DIAGNOSIS — N6489 Other specified disorders of breast: Secondary | ICD-10-CM

## 2021-08-09 ENCOUNTER — Ambulatory Visit
Admission: RE | Admit: 2021-08-09 | Discharge: 2021-08-09 | Disposition: A | Discharge status: Home / Self Care | Payer: Medicare HMO | Source: Ambulatory Visit | Attending: Family Medicine | Admitting: Family Medicine

## 2021-08-09 DIAGNOSIS — Z780 Asymptomatic menopausal state: Secondary | ICD-10-CM | POA: Diagnosis not present

## 2021-08-11 DIAGNOSIS — M25551 Pain in right hip: Secondary | ICD-10-CM | POA: Diagnosis not present

## 2021-08-11 DIAGNOSIS — G894 Chronic pain syndrome: Secondary | ICD-10-CM | POA: Diagnosis not present

## 2021-08-11 DIAGNOSIS — M25571 Pain in right ankle and joints of right foot: Secondary | ICD-10-CM | POA: Diagnosis not present

## 2021-08-11 DIAGNOSIS — Z79891 Long term (current) use of opiate analgesic: Secondary | ICD-10-CM | POA: Diagnosis not present

## 2021-08-11 DIAGNOSIS — M25552 Pain in left hip: Secondary | ICD-10-CM | POA: Diagnosis not present

## 2021-08-11 DIAGNOSIS — M25562 Pain in left knee: Secondary | ICD-10-CM | POA: Diagnosis not present

## 2021-08-11 DIAGNOSIS — M25511 Pain in right shoulder: Secondary | ICD-10-CM | POA: Diagnosis not present

## 2021-08-16 ENCOUNTER — Ambulatory Visit
Admission: RE | Admit: 2021-08-16 | Discharge: 2021-08-16 | Disposition: A | Discharge status: Home / Self Care | Payer: Medicare HMO | Source: Ambulatory Visit | Attending: Obstetrics and Gynecology | Admitting: Obstetrics and Gynecology

## 2021-08-16 DIAGNOSIS — N6489 Other specified disorders of breast: Secondary | ICD-10-CM | POA: Diagnosis not present

## 2021-08-16 DIAGNOSIS — R928 Other abnormal and inconclusive findings on diagnostic imaging of breast: Secondary | ICD-10-CM

## 2021-08-16 DIAGNOSIS — R921 Mammographic calcification found on diagnostic imaging of breast: Secondary | ICD-10-CM | POA: Insufficient documentation

## 2021-08-17 ENCOUNTER — Telehealth: Payer: Self-pay | Admitting: Obstetrics and Gynecology

## 2021-08-17 ENCOUNTER — Other Ambulatory Visit: Payer: Self-pay | Admitting: Family Medicine

## 2021-08-17 ENCOUNTER — Other Ambulatory Visit: Payer: Self-pay | Admitting: Obstetrics and Gynecology

## 2021-08-17 DIAGNOSIS — E785 Hyperlipidemia, unspecified: Secondary | ICD-10-CM

## 2021-08-17 DIAGNOSIS — R921 Mammographic calcification found on diagnostic imaging of breast: Secondary | ICD-10-CM

## 2021-08-17 DIAGNOSIS — R928 Other abnormal and inconclusive findings on diagnostic imaging of breast: Secondary | ICD-10-CM

## 2021-08-17 NOTE — Telephone Encounter (Signed)
PT returned your call and wanted to know if you could call her back. ?Thanks ?

## 2021-08-18 NOTE — Telephone Encounter (Signed)
Spoke with pt yesterday afternoon and answered questions. Waiting on bx results at this point. Pt nervous about bx and dx.  ?

## 2021-08-26 NOTE — Telephone Encounter (Signed)
Please let pt know when she calls back. Tried to call with no answer

## 2021-08-26 NOTE — Telephone Encounter (Signed)
Have pt call [facility] and discuss with them since they are doing the bx.

## 2021-08-26 NOTE — Telephone Encounter (Signed)
Pt is calling and has questions about upcoming appt for bx. States she is not wanting to have two bx's done in one day and wanting to speak with [facility] about this and see what she thinks. I advised her [facility] will not be here until next week. Spoke with grecia and she advised me send it to [facility].

## 2021-08-28 ENCOUNTER — Other Ambulatory Visit: Payer: Self-pay | Admitting: Family Medicine

## 2021-08-28 DIAGNOSIS — I10 Essential (primary) hypertension: Secondary | ICD-10-CM

## 2021-09-02 ENCOUNTER — Inpatient Hospital Stay: Admission: RE | Admit: 2021-09-02 | Payer: Medicare HMO | Source: Ambulatory Visit

## 2021-09-02 DIAGNOSIS — D0510 Intraductal carcinoma in situ of unspecified breast: Secondary | ICD-10-CM

## 2021-09-02 HISTORY — DX: Intraductal carcinoma in situ of unspecified breast: D05.10

## 2021-09-03 DIAGNOSIS — Z79891 Long term (current) use of opiate analgesic: Secondary | ICD-10-CM | POA: Diagnosis not present

## 2021-09-03 DIAGNOSIS — G894 Chronic pain syndrome: Secondary | ICD-10-CM | POA: Diagnosis not present

## 2021-09-03 DIAGNOSIS — M25552 Pain in left hip: Secondary | ICD-10-CM | POA: Diagnosis not present

## 2021-09-03 DIAGNOSIS — M25562 Pain in left knee: Secondary | ICD-10-CM | POA: Diagnosis not present

## 2021-09-03 DIAGNOSIS — M25551 Pain in right hip: Secondary | ICD-10-CM | POA: Diagnosis not present

## 2021-09-03 DIAGNOSIS — M25571 Pain in right ankle and joints of right foot: Secondary | ICD-10-CM | POA: Diagnosis not present

## 2021-09-03 DIAGNOSIS — M25511 Pain in right shoulder: Secondary | ICD-10-CM | POA: Diagnosis not present

## 2021-09-08 ENCOUNTER — Ambulatory Visit: Payer: Self-pay

## 2021-09-08 NOTE — Telephone Encounter (Signed)
Chief Complaint: Rash (thinks it's drug related) Symptoms: Red, flat rash to bilateral shins down to feet, no itching just tingling at times, tiny bumps that looks like heat rash (cream applied with no relief) Frequency: Onset 3 days Pertinent Negatives: Patient denies SOB, tingling in throat, rash anywhere else, fever Disposition: '[]'$ ED /'[]'$ Urgent Care (no appt availability in office) / '[x]'$ Appointment(In office/virtual)/ '[]'$  [facility]/ '[]'$ Home Care/ '[]'$ Refused Recommended Disposition /'[]'$ [facility]/ '[]'$  Follow-up with PCP Additional Notes: Patient reports starting on HCTZ and Lipitor (generic) within the past month and thinks it may be medication related. Advised evaluation in office, appt scheduled.     Reason for Disposition  Taking new prescription medicine  (Exceptions: finished taking new prescription antibiotic OR questions about flushing from niacin)  Answer Assessment - Initial Assessment Questions 1. APPEARANCE of RASH: "Describe the rash." (e.g., spots, blisters, raised areas, skin peeling, scaly)     Bright red, flat bumps looks like a heat rash 2. SIZE: "How big are the spots?" (e.g., tip of pen, eraser, coin; inches, centimeters)     Most are really tiny 3. LOCATION: "Where is the rash located?"      Both shins to feet 4. COLOR: "What color is the rash?" (Note: It is difficult to assess rash color in people with darker-colored skin. When this situation occurs, simply ask the caller to describe what they see.)     Bright red 5. ONSET: "When did the rash begin?"     About 3 days ago 6. FEVER: "Do you have a fever?" If Yes, ask: "What is your temperature, how was it measured, and when did it start?"     No 7. ITCHING: "Does the rash itch?" If Yes, ask: "How bad is the itch?" (Scale 1-10; or mild, moderate, severe)     Kind of tingle then go away 8. CAUSE: "What do you think is causing the rash?"     Possible medication, not sure 9. NEW MEDICATION: "What  new medication are you taking?" (e.g., name of antibiotic) "When did you start taking this medication?".     HCTZ, generic lipitor started within last month 10. OTHER SYMPTOMS: "Do you have any other symptoms?" (e.g., sore throat, fever, joint pain)       No 11. PREGNANCY: "Is there any chance you are pregnant?" "When was your last menstrual period?"       N/A  Protocols used: Rash - Widespread On Drugs-A-AH

## 2021-09-09 ENCOUNTER — Ambulatory Visit (INDEPENDENT_AMBULATORY_CARE_PROVIDER_SITE_OTHER): Payer: Medicare HMO | Admitting: Family Medicine

## 2021-09-09 ENCOUNTER — Other Ambulatory Visit: Payer: Self-pay | Admitting: Family Medicine

## 2021-09-09 ENCOUNTER — Encounter: Payer: Self-pay | Admitting: Family Medicine

## 2021-09-09 VITALS — BP 126/80 | HR 88 | Ht 68.0 in | Wt 197.0 lb

## 2021-09-09 DIAGNOSIS — R233 Spontaneous ecchymoses: Secondary | ICD-10-CM

## 2021-09-09 DIAGNOSIS — E785 Hyperlipidemia, unspecified: Secondary | ICD-10-CM

## 2021-09-09 MED ORDER — TRIAMCINOLONE ACETONIDE 0.1 % EX CREA: 1.0000 "application " | TOPICAL_CREAM | Freq: Two times a day (BID) | CUTANEOUS | 0 refills | Status: DC

## 2021-09-09 NOTE — Progress Notes (Signed)
Date:  09/09/2021   Name:  Kaitlyn Ali   DOB:  [birth year]   MRN:  [id number]   Chief Complaint: Rash (On both legs- worked in a building cleaning it out x 1 week ago)  Rash This is a new problem. The current episode started in the past 7 days. The problem is unchanged. The affected locations include the left lower leg, right lower leg, left ankle, left foot, right ankle and right foot. Rash characteristics: nonpruritic. Pertinent negatives include no fever. Past treatments include moisturizer.    Lab Results  Component Value Date   NA 140 07/15/2021   K 4.8 07/15/2021   CO2 25 07/15/2021   GLUCOSE 100 (H) 07/15/2021   BUN 16 07/15/2021   CREATININE 1.03 (H) 07/15/2021   CALCIUM 10.2 07/15/2021   EGFR 60 07/15/2021   GFRNONAA 56 (L) 10/11/2017   Lab Results  Component Value Date   CHOL 234 (H) 07/15/2021   HDL 43 07/15/2021   LDLCALC 152 (H) 07/15/2021   TRIG 211 (H) 07/15/2021   CHOLHDL 5.6 (H) 02/16/2017   No results found for: "TSH" No results found for: "HGBA1C" No results found for: "WBC", "HGB", "HCT", "MCV", "PLT" Lab Results  Component Value Date   AST 17 10/11/2017   ALKPHOS 70 10/11/2017   BILITOT 0.4 10/11/2017   Lab Results  Component Value Date   25OHVITD2 <1.0 10/11/2017   25OHVITD3 26 10/11/2017     Review of Systems  Constitutional:  Negative for fever.  Cardiovascular:  Negative for leg swelling.  Skin:  Positive for rash.    Patient Active Problem List   Diagnosis Date Noted   Family history of breast cancer 06/14/2021   Chronic pain of both knees (Secondary Area of Pain) (L>R) 10/11/2017   Chronic right shoulder pain (Primary Area of Pain) 10/11/2017   Chronic pain syndrome 10/11/2017   Long term current use of opiate analgesic 10/11/2017   Pharmacologic therapy 10/11/2017   Disorder of skeletal system 10/11/2017   Problems influencing health status 10/11/2017    Allergies  Allergen Reactions   Doxycycline Itching   Nsaids      Bothers stomach    Past Surgical History:  Procedure Laterality Date   LAPAROSCOPIC HYSTERECTOMY     supracervical hyst due to AUB; Dr. Davis Gourd   TOE SURGERY     shaved bone in R) great toe   TONSILLECTOMY      Social History   Tobacco Use   Smoking status: Former   Smokeless tobacco: Never  Vaping Use   Vaping Use: Never used  Substance Use Topics   Alcohol use: Yes    Alcohol/week: 0.0 standard drinks of alcohol   Drug use: No     Medication list has been reviewed and updated.  No outpatient medications have been marked as taking for the 09/09/21 encounter (Office Visit) with Juline Patch, MD.       09/09/2021   10:33 AM 07/15/2021    8:53 AM  GAD 7 : Generalized Anxiety Score  Nervous, Anxious, on Edge 2 0  Control/stop worrying 1 0  Worry too much - different things 1 0  Trouble relaxing 1 0  Restless 0 0  Easily annoyed or irritable 0 0  Afraid - awful might happen 0 0  Total GAD 7 Score 5 0  Anxiety Difficulty Not difficult at all Not difficult at all       09/09/2021   10:33 AM  Depression screen  PHQ 2/9  Decreased Interest 1  Down, Depressed, Hopeless 0  PHQ - 2 Score 1  Altered sleeping 1  Tired, decreased energy 0  Change in appetite 1  Feeling bad or failure about yourself  0  Trouble concentrating 0  Moving slowly or fidgety/restless 1  Suicidal thoughts 0  PHQ-9 Score 4  Difficult doing work/chores Not difficult at all    BP Readings from Last 3 Encounters:  09/09/21 126/80  07/15/21 128/70  06/14/21 120/80    Physical Exam Vitals and nursing note reviewed.  Constitutional:      Appearance: She is well-developed.  HENT:     Head: Normocephalic.     Right Ear: External ear normal.     Left Ear: External ear normal.  Eyes:     General: Lids are everted, no foreign bodies appreciated. No scleral icterus.       Left eye: No foreign body or hordeolum.     Conjunctiva/sclera: Conjunctivae normal.     Right eye: Right  conjunctiva is not injected.     Left eye: Left conjunctiva is not injected.     Pupils: Pupils are equal, round, and reactive to light.  Neck:     Thyroid: No thyromegaly.     Vascular: No JVD.     Trachea: No tracheal deviation.  Cardiovascular:     Rate and Rhythm: Normal rate and regular rhythm.     Heart sounds: Normal heart sounds. No murmur heard.    No friction rub. No gallop.  Pulmonary:     Effort: Pulmonary effort is normal. No respiratory distress.     Breath sounds: Normal breath sounds. No wheezing, rhonchi or rales.  Abdominal:     General: Bowel sounds are normal.     Palpations: Abdomen is soft. There is no mass.     Tenderness: There is no abdominal tenderness. There is no guarding or rebound.  Musculoskeletal:        General: No tenderness. Normal range of motion.     Cervical back: Normal range of motion and neck supple.  Lymphadenopathy:     Cervical: No cervical adenopathy.  Skin:    General: Skin is warm.     Findings: No rash.  Neurological:     Mental Status: She is alert.     Cranial Nerves: No cranial nerve deficit.     Deep Tendon Reflexes: Reflexes normal.  Psychiatric:        Mood and Affect: Mood is not anxious or depressed.     Wt Readings from Last 3 Encounters:  09/09/21 197 lb (89.4 kg)  07/15/21 190 lb (86.2 kg)  06/14/21 190 lb (86.2 kg)    BP 126/80   Pulse 88   Ht '5\' 8"'  (1.727 m)   Wt 197 lb (89.4 kg)   BMI 29.95 kg/m   Assessment and Plan:  1. Petechiae New onset.  Persistent.  Stable.  Occurred over the past 2 weeks.  Patient relates that she was squatting either to do some cleaning or to get things out from underneath cabinets.  I suspect that these are petechiae secondary to the strain that is placed in the lower legs from the squatting.  Patient does not have any symptoms of heart or RMSF concern.  We will continue to watch.  There is some itchy areas on the torso which are unrelated and I have given her some triamcinolone  to use on a as needed basis on these. - triamcinolone cream (  KENALOG) 0.1 %; Apply 1 application. topically 2 (two) times daily.  Dispense: 30 g; Refill: 0

## 2021-09-21 ENCOUNTER — Telehealth: Payer: Self-pay | Admitting: Family Medicine

## 2021-09-21 NOTE — Telephone Encounter (Signed)
Copied from [place] [id number]. Topic: General - Other >> Sep 21, 2021  3:47 PM [name] wrote: Reason for CRM: Pt called in stating she was advised she should get a blood draw to check her platelets, please advise if lab orders can be placed.

## 2021-09-22 ENCOUNTER — Other Ambulatory Visit: Payer: Self-pay

## 2021-09-22 DIAGNOSIS — R233 Spontaneous ecchymoses: Secondary | ICD-10-CM

## 2021-09-22 NOTE — Progress Notes (Signed)
Printed off cbc- in basket

## 2021-09-23 ENCOUNTER — Encounter: Payer: Self-pay | Admitting: Family Medicine

## 2021-09-23 ENCOUNTER — Ambulatory Visit (INDEPENDENT_AMBULATORY_CARE_PROVIDER_SITE_OTHER): Payer: Medicare HMO | Admitting: Family Medicine

## 2021-09-23 VITALS — BP 130/92 | HR 80 | Ht 68.0 in | Wt 194.0 lb

## 2021-09-23 DIAGNOSIS — R233 Spontaneous ecchymoses: Secondary | ICD-10-CM | POA: Diagnosis not present

## 2021-09-23 DIAGNOSIS — D692 Other nonthrombocytopenic purpura: Secondary | ICD-10-CM | POA: Diagnosis not present

## 2021-09-23 DIAGNOSIS — Z113 Encounter for screening for infections with a predominantly sexual mode of transmission: Secondary | ICD-10-CM | POA: Diagnosis not present

## 2021-09-23 DIAGNOSIS — L817 Pigmented purpuric dermatosis: Secondary | ICD-10-CM | POA: Diagnosis not present

## 2021-09-23 DIAGNOSIS — R21 Rash and other nonspecific skin eruption: Secondary | ICD-10-CM | POA: Diagnosis not present

## 2021-09-23 DIAGNOSIS — R52 Pain, unspecified: Secondary | ICD-10-CM | POA: Diagnosis not present

## 2021-09-23 NOTE — Progress Notes (Signed)
Date:  09/23/2021   Name:  Kaitlyn Ali   DOB:  [birth year]   MRN:  [id number]   Chief Complaint: Rash (Is spreading over legs and arms- tingling)  Rash This is a new problem. The current episode started 1 to 4 weeks ago. The problem is unchanged. The affected locations include the right lower leg, left lower leg, left arm and right arm. Rash characteristics: nonpalpable/pigmented. Pertinent negatives include no cough, diarrhea, fever, shortness of breath or sore throat. Past treatments include topical steroids.    Lab Results  Component Value Date   NA 140 07/15/2021   K 4.8 07/15/2021   CO2 25 07/15/2021   GLUCOSE 100 (H) 07/15/2021   BUN 16 07/15/2021   CREATININE 1.03 (H) 07/15/2021   CALCIUM 10.2 07/15/2021   EGFR 60 07/15/2021   GFRNONAA 56 (L) 10/11/2017   Lab Results  Component Value Date   CHOL 234 (H) 07/15/2021   HDL 43 07/15/2021   LDLCALC 152 (H) 07/15/2021   TRIG 211 (H) 07/15/2021   CHOLHDL 5.6 (H) 02/16/2017   No results found for: "TSH" No results found for: "HGBA1C" No results found for: "WBC", "HGB", "HCT", "MCV", "PLT" Lab Results  Component Value Date   AST 17 10/11/2017   ALKPHOS 70 10/11/2017   BILITOT 0.4 10/11/2017   Lab Results  Component Value Date   25OHVITD2 <1.0 10/11/2017   25OHVITD3 26 10/11/2017     Review of Systems  Constitutional:  Negative for chills and fever.  HENT:  Negative for drooling, ear discharge, ear pain and sore throat.   Respiratory:  Negative for cough, shortness of breath and wheezing.   Cardiovascular:  Negative for chest pain, palpitations and leg swelling.  Gastrointestinal:  Negative for abdominal pain, blood in stool, constipation, diarrhea and nausea.  Endocrine: Negative for polydipsia.  Genitourinary:  Negative for dysuria, frequency, hematuria and urgency.  Musculoskeletal:  Negative for back pain, myalgias and neck pain.  Skin:  Positive for rash.  Allergic/Immunologic: Negative for  environmental allergies.  Neurological:  Negative for dizziness and headaches.  Hematological:  Does not bruise/bleed easily.  Psychiatric/Behavioral:  Negative for suicidal ideas. The patient is not nervous/anxious.     Patient Active Problem List   Diagnosis Date Noted   Family history of breast cancer 06/14/2021   Chronic pain of both knees (Secondary Area of Pain) (L>R) 10/11/2017   Chronic right shoulder pain (Primary Area of Pain) 10/11/2017   Chronic pain syndrome 10/11/2017   Long term current use of opiate analgesic 10/11/2017   Pharmacologic therapy 10/11/2017   Disorder of skeletal system 10/11/2017   Problems influencing health status 10/11/2017    Allergies  Allergen Reactions   Doxycycline Itching   Nsaids     Bothers stomach    Past Surgical History:  Procedure Laterality Date   LAPAROSCOPIC HYSTERECTOMY     supracervical hyst due to AUB; Dr. Davis Gourd   TOE SURGERY     shaved bone in R) great toe   TONSILLECTOMY      Social History   Tobacco Use   Smoking status: Former   Smokeless tobacco: Never  Vaping Use   Vaping Use: Never used  Substance Use Topics   Alcohol use: Yes    Alcohol/week: 0.0 standard drinks of alcohol   Drug use: No     Medication list has been reviewed and updated.  Current Meds  Medication Sig   atorvastatin (LIPITOR) 10 MG tablet TAKE 1 TABLET(10 MG)  BY MOUTH DAILY   buPROPion (WELLBUTRIN XL) 150 MG 24 hr tablet Take 150 mg by mouth every morning.   diazepam (VALIUM) 5 MG tablet Take 5 mg by mouth 2 (two) times daily as needed. Dr Toy Care   diphenhydrAMINE (BENADRYL) 25 mg capsule Take 25 mg by mouth every 6 (six) hours as needed.   hydrochlorothiazide (HYDRODIURIL) 12.5 MG tablet TAKE 1 TABLET(12.5 MG) BY MOUTH DAILY   neomycin-polymyxin b-dexamethasone (MAXITROL) 3.5-10000-0.1 OINT    neomycin-polymyxin b-dexamethasone (MAXITROL) 3.5-10000-0.1 SUSP SMARTSIG:In Eye(s)   oxyCODONE-acetaminophen (PERCOCET) 10-325 MG per  tablet Take 1 tablet by mouth every 8 (eight) hours as needed. [facility] pain [facility]   triamcinolone cream (KENALOG) 0.1 % Apply 1 application. topically 2 (two) times daily.       09/09/2021   10:33 AM 07/15/2021    8:53 AM  GAD 7 : Generalized Anxiety Score  Nervous, Anxious, on Edge 2 0  Control/stop worrying 1 0  Worry too much - different things 1 0  Trouble relaxing 1 0  Restless 0 0  Easily annoyed or irritable 0 0  Afraid - awful might happen 0 0  Total GAD 7 Score 5 0  Anxiety Difficulty Not difficult at all Not difficult at all       09/09/2021   10:33 AM  Depression screen PHQ 2/9  Decreased Interest 1  Down, Depressed, Hopeless 0  PHQ - 2 Score 1  Altered sleeping 1  Tired, decreased energy 0  Change in appetite 1  Feeling bad or failure about yourself  0  Trouble concentrating 0  Moving slowly or fidgety/restless 1  Suicidal thoughts 0  PHQ-9 Score 4  Difficult doing work/chores Not difficult at all    BP Readings from Last 3 Encounters:  09/23/21 (!) 130/92  09/09/21 126/80  07/15/21 128/70    Physical Exam  Wt Readings from Last 3 Encounters:  09/23/21 194 lb (88 kg)  09/09/21 197 lb (89.4 kg)  07/15/21 190 lb (86.2 kg)    BP (!) 130/92 (BP Location: Left Arm, Cuff Size: Normal)   Pulse 80   Ht _0  (1.727 m)   Wt 194 lb (88 kg)   BMI 29.50 kg/m   Assessment and Plan:  1. Petechiae See below - Ambulatory referral to Dermatology - Sedimentation rate - RPR - Rocky mtn spotted fvr abs pnl(IgG+IgM) - Lyme Disease Serology w/Reflex - Renal Function Panel - HIV Antibody (routine testing w rflx)  2. Purpura ([place]) New onset.  Persistent.  First noted on my evaluation on the sixth.  This has persisted and now has become more of a purpuric nature which is now involving only the extremities.  There is been no fever or headache.  Discontinue hydrochlorothiazide and atorvastatin.  We will obtain below labs for evaluation. - Sedimentation rate -  RPR - Rocky mtn spotted fvr abs pnl(IgG+IgM) - Lyme Disease Serology w/Reflex - Renal Function Panel - HIV Antibody (routine testing w rflx)

## 2021-09-24 LAB — CBC WITH DIFFERENTIAL/PLATELET
Basophils Absolute: 0.1 10*3/uL (ref 0.0–0.2)
Basos: 1 %
EOS (ABSOLUTE): 0.5 10*3/uL — ABNORMAL HIGH (ref 0.0–0.4)
Eos: 6 %
Hematocrit: 46.5 % (ref 34.0–46.6)
Hemoglobin: 15.4 g/dL (ref 11.1–15.9)
Immature Grans (Abs): 0 10*3/uL (ref 0.0–0.1)
Immature Granulocytes: 0 %
Lymphocytes Absolute: 2.4 10*3/uL (ref 0.7–3.1)
Lymphs: 26 %
MCH: 30.3 pg (ref 26.6–33.0)
MCHC: 33.1 g/dL (ref 31.5–35.7)
MCV: 91 fL (ref 79–97)
Monocytes Absolute: 0.8 10*3/uL (ref 0.1–0.9)
Monocytes: 9 %
Neutrophils Absolute: 5.4 10*3/uL (ref 1.4–7.0)
Neutrophils: 58 %
Platelets: 344 10*3/uL (ref 150–450)
RBC: 5.09 x10E6/uL (ref 3.77–5.28)
RDW: 13.1 % (ref 11.7–15.4)
WBC: 9.3 10*3/uL (ref 3.4–10.8)

## 2021-09-27 ENCOUNTER — Ambulatory Visit
Admission: RE | Admit: 2021-09-27 | Discharge: 2021-09-27 | Disposition: A | Discharge status: Home / Self Care | Payer: Medicare HMO | Source: Ambulatory Visit | Attending: Obstetrics and Gynecology | Admitting: Obstetrics and Gynecology

## 2021-09-27 DIAGNOSIS — R928 Other abnormal and inconclusive findings on diagnostic imaging of breast: Secondary | ICD-10-CM

## 2021-09-27 DIAGNOSIS — R921 Mammographic calcification found on diagnostic imaging of breast: Secondary | ICD-10-CM

## 2021-09-27 DIAGNOSIS — D0511 Intraductal carcinoma in situ of right breast: Secondary | ICD-10-CM | POA: Diagnosis not present

## 2021-09-27 HISTORY — PX: BREAST BIOPSY: SHX20

## 2021-09-27 LAB — RENAL FUNCTION PANEL
Albumin: 4.9 g/dL — ABNORMAL HIGH (ref 3.8–4.8)
BUN/Creatinine Ratio: 15 (ref 12–28)
BUN: 17 mg/dL (ref 8–27)
CO2: 23 mmol/L (ref 20–29)
Calcium: 10.6 mg/dL — ABNORMAL HIGH (ref 8.7–10.3)
Chloride: 99 mmol/L (ref 96–106)
Creatinine, Ser: 1.1 mg/dL — ABNORMAL HIGH (ref 0.57–1.00)
Glucose: 111 mg/dL — ABNORMAL HIGH (ref 70–99)
Phosphorus: 3.8 mg/dL (ref 3.0–4.3)
Potassium: 4.3 mmol/L (ref 3.5–5.2)
Sodium: 141 mmol/L (ref 134–144)
eGFR: 56 mL/min/{1.73_m2} — ABNORMAL LOW (ref >59)

## 2021-09-27 LAB — LYME DISEASE SEROLOGY W/REFLEX: Lyme Total Antibody EIA: NEGATIVE

## 2021-09-27 LAB — ROCKY MTN SPOTTED FVR ABS PNL(IGG+IGM)
RMSF IgG: NEGATIVE
RMSF IgM: 0.42 index (ref 0.00–0.89)

## 2021-09-27 LAB — RPR: RPR Ser Ql: NONREACTIVE

## 2021-09-27 LAB — HIV ANTIBODY (ROUTINE TESTING W REFLEX): HIV Screen 4th Generation wRfx: NONREACTIVE

## 2021-09-27 LAB — SEDIMENTATION RATE: Sed Rate: 12 mm/hr (ref 0–40)

## 2021-09-28 ENCOUNTER — Encounter: Payer: Self-pay | Admitting: *Deleted

## 2021-09-28 LAB — SURGICAL PATHOLOGY

## 2021-09-29 ENCOUNTER — Encounter: Payer: Self-pay | Admitting: *Deleted

## 2021-09-29 DIAGNOSIS — D0511 Intraductal carcinoma in situ of right breast: Secondary | ICD-10-CM

## 2021-09-29 NOTE — Progress Notes (Addendum)
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  Navigation initiated.  Med Onc and Surgical consults scheduled. Dr. Bary Castilla on 6/29 and Dr. Grayland Ormond 7/13

## 2021-09-30 ENCOUNTER — Ambulatory Visit (INDEPENDENT_AMBULATORY_CARE_PROVIDER_SITE_OTHER): Payer: Medicare HMO | Admitting: Family Medicine

## 2021-09-30 ENCOUNTER — Encounter: Payer: Self-pay | Admitting: Family Medicine

## 2021-09-30 ENCOUNTER — Other Ambulatory Visit: Payer: Self-pay | Admitting: General Surgery

## 2021-09-30 VITALS — BP 120/80 | HR 68 | Ht 68.0 in | Wt 194.0 lb

## 2021-09-30 DIAGNOSIS — I10 Essential (primary) hypertension: Secondary | ICD-10-CM

## 2021-09-30 DIAGNOSIS — D0511 Intraductal carcinoma in situ of right breast: Secondary | ICD-10-CM

## 2021-09-30 NOTE — Progress Notes (Signed)
Date:  09/30/2021   Name:  Kaitlyn Ali   DOB:  02/13/1956   MRN:  [id number]   Chief Complaint: Hypertension  Hypertension This is a chronic problem. The current episode started more than 1 year ago. The problem has been gradually improving since onset. The problem is controlled. Pertinent negatives include no anxiety, blurred vision, chest pain, headaches, orthopnea, palpitations, peripheral edema, PND, shortness of breath or sweats. Past treatments include diuretics. The current treatment provides moderate improvement.    Lab Results  Component Value Date   NA 141 09/23/2021   K 4.3 09/23/2021   CO2 23 09/23/2021   GLUCOSE 111 (H) 09/23/2021   BUN 17 09/23/2021   CREATININE 1.10 (H) 09/23/2021   CALCIUM 10.6 (H) 09/23/2021   EGFR 56 (L) 09/23/2021   GFRNONAA 56 (L) 10/11/2017   Lab Results  Component Value Date   CHOL 234 (H) 07/15/2021   HDL 43 07/15/2021   LDLCALC 152 (H) 07/15/2021   TRIG 211 (H) 07/15/2021   CHOLHDL 5.6 (H) 02/16/2017   No results found for: "TSH" No results found for: "HGBA1C" Lab Results  Component Value Date   WBC 9.3 09/23/2021   HGB 15.4 09/23/2021   HCT 46.5 09/23/2021   MCV 91 09/23/2021   PLT 344 09/23/2021   Lab Results  Component Value Date   AST 17 10/11/2017   ALKPHOS 70 10/11/2017   BILITOT 0.4 10/11/2017   Lab Results  Component Value Date   25OHVITD2 <1.0 10/11/2017   25OHVITD3 26 10/11/2017     Review of Systems  Eyes:  Negative for blurred vision.  Respiratory:  Negative for shortness of breath.   Cardiovascular:  Negative for chest pain, palpitations, orthopnea, leg swelling and PND.  Gastrointestinal:  Negative for blood in stool, constipation and diarrhea.  Skin:  Positive for rash.  Neurological:  Negative for headaches.    Patient Active Problem List   Diagnosis Date Noted   Family history of breast cancer 06/14/2021   Chronic pain of both knees (Secondary Area of Pain) (L>R) 10/11/2017   Chronic  right shoulder pain (Primary Area of Pain) 10/11/2017   Chronic pain syndrome 10/11/2017   Long term current use of opiate analgesic 10/11/2017   Pharmacologic therapy 10/11/2017   Disorder of skeletal system 10/11/2017   Problems influencing health status 10/11/2017    Allergies  Allergen Reactions   Doxycycline Itching   Nsaids     Bothers stomach    Past Surgical History:  Procedure Laterality Date   BREAST BIOPSY Right 09/27/2021   UOQ mid lateral calcs, coil marker, path pending   BREAST BIOPSY Right 09/27/2021   UOQ calcs anterior, X marker, path pending   BREAST BIOPSY Left 09/27/2021   Distortion, ribbon marker, path pending   LAPAROSCOPIC HYSTERECTOMY     supracervical hyst due to AUB; Dr. Davis Gourd   TOE SURGERY     shaved bone in R) great toe   TONSILLECTOMY      Social History   Tobacco Use   Smoking status: Former   Smokeless tobacco: Never  Vaping Use   Vaping Use: Never used  Substance Use Topics   Alcohol use: Yes    Alcohol/week: 0.0 standard drinks of alcohol   Drug use: No     Medication list has been reviewed and updated.  Current Meds  Medication Sig   buPROPion (WELLBUTRIN XL) 150 MG 24 hr tablet Take 150 mg by mouth every morning.   diazepam (  VALIUM) 5 MG tablet Take 5 mg by mouth 2 (two) times daily as needed. Dr Toy Care   diphenhydrAMINE (BENADRYL) 25 mg capsule Take 25 mg by mouth every 6 (six) hours as needed.   hydrochlorothiazide (HYDRODIURIL) 12.5 MG tablet TAKE 1 TABLET(12.5 MG) BY MOUTH DAILY   neomycin-polymyxin b-dexamethasone (MAXITROL) 3.5-10000-0.1 OINT    neomycin-polymyxin b-dexamethasone (MAXITROL) 3.5-10000-0.1 SUSP SMARTSIG:In Eye(s)   oxyCODONE-acetaminophen (PERCOCET) 10-325 MG per tablet Take 1 tablet by mouth every 8 (eight) hours as needed. [facility] pain management   triamcinolone cream (KENALOG) 0.1 % Apply 1 application. topically 2 (two) times daily.       09/09/2021   10:33 AM 07/15/2021    8:53 AM  GAD 7 :  Generalized Anxiety Score  Nervous, Anxious, on Edge 2 0  Control/stop worrying 1 0  Worry too much - different things 1 0  Trouble relaxing 1 0  Restless 0 0  Easily annoyed or irritable 0 0  Afraid - awful might happen 0 0  Total GAD 7 Score 5 0  Anxiety Difficulty Not difficult at all Not difficult at all       09/09/2021   10:33 AM 07/15/2021    8:52 AM 04/19/2021    3:28 PM  Depression screen PHQ 2/9  Decreased Interest 1 0 0  Down, Depressed, Hopeless 0 0 0  PHQ - 2 Score 1 0 0  Altered sleeping 1 0   Tired, decreased energy 0 0   Change in appetite 1 0   Feeling bad or failure about yourself  0 0   Trouble concentrating 0 0   Moving slowly or fidgety/restless 1 0   Suicidal thoughts 0 0   PHQ-9 Score 4 0   Difficult doing work/chores Not difficult at all Not difficult at all     BP Readings from Last 3 Encounters:  09/30/21 120/80  09/23/21 (!) 130/92  09/09/21 126/80    Physical Exam Vitals and nursing note reviewed.  Constitutional:      Appearance: She is well-developed.  HENT:     Head: Normocephalic.     Right Ear: External ear normal.     Left Ear: External ear normal.     Nose: No congestion or rhinorrhea.  Eyes:     General: Lids are everted, no foreign bodies appreciated. No scleral icterus.       Left eye: No foreign body or hordeolum.     Conjunctiva/sclera: Conjunctivae normal.     Right eye: Right conjunctiva is not injected.     Left eye: Left conjunctiva is not injected.     Pupils: Pupils are equal, round, and reactive to light.  Neck:     Thyroid: No thyromegaly.     Vascular: No JVD.     Trachea: No tracheal deviation.  Cardiovascular:     Rate and Rhythm: Normal rate and regular rhythm.     Heart sounds: Normal heart sounds. No murmur heard.    No friction rub. No gallop.  Pulmonary:     Effort: Pulmonary effort is normal. No respiratory distress.     Breath sounds: Normal breath sounds. No wheezing, rhonchi or rales.  Abdominal:      General: Bowel sounds are normal.     Palpations: Abdomen is soft. There is no mass.     Tenderness: There is no abdominal tenderness. There is no guarding or rebound.  Musculoskeletal:        General: No tenderness. Normal range of motion.  Cervical back: Normal range of motion and neck supple.  Lymphadenopathy:     Cervical: No cervical adenopathy.  Skin:    General: Skin is warm.     Findings: No rash.  Neurological:     Mental Status: She is alert and oriented to person, place, and time.     Cranial Nerves: No cranial nerve deficit.     Deep Tendon Reflexes: Reflexes normal.  Psychiatric:        Mood and Affect: Mood is not anxious or depressed.     Wt Readings from Last 3 Encounters:  09/30/21 194 lb (88 kg)  09/23/21 194 lb (88 kg)  09/09/21 197 lb (89.4 kg)    BP 120/80   Pulse 68   Ht '5\' 8"'  (1.727 m)   Wt 194 lb (88 kg)   BMI 29.50 kg/m   Assessment and Plan:  1. Primary hypertension Chronic.  Complicated.  Stable.  Blood pressure today is 120/80 but she started back on her hydrochlorothiazide just yesterday.  Not so sure that she still has significant hypertension at least requiring medication.  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  I would like to continue discontinuing the hydrochlorothiazide and statin agent until we have clarified the etiology of her rash and the possibility that the vasculitis may be medication associated.  This is also clouded by the issue that patient's recently been diagnosed with breast cancer and will be having some undergoing some chemotherapy and this may also trigger areas of concern.

## 2021-10-01 ENCOUNTER — Other Ambulatory Visit: Payer: Self-pay | Admitting: General Surgery

## 2021-10-01 DIAGNOSIS — G894 Chronic pain syndrome: Secondary | ICD-10-CM | POA: Diagnosis not present

## 2021-10-01 DIAGNOSIS — M25571 Pain in right ankle and joints of right foot: Secondary | ICD-10-CM | POA: Diagnosis not present

## 2021-10-01 DIAGNOSIS — Z79891 Long term (current) use of opiate analgesic: Secondary | ICD-10-CM | POA: Diagnosis not present

## 2021-10-01 DIAGNOSIS — M25551 Pain in right hip: Secondary | ICD-10-CM | POA: Diagnosis not present

## 2021-10-01 DIAGNOSIS — M25562 Pain in left knee: Secondary | ICD-10-CM | POA: Diagnosis not present

## 2021-10-01 DIAGNOSIS — M25552 Pain in left hip: Secondary | ICD-10-CM | POA: Diagnosis not present

## 2021-10-01 NOTE — Progress Notes (Signed)
Subjective:     Patient ID: Kaitlyn Ali is a 66 y.o. female.   HPI   The following portions of the patient's history were reviewed and updated as appropriate.   This a new patient is here today for: office visit. Here today for a breast evaluation referred by Dr Otilio Miu.   She had right breast biopsy 09-27-21 and left breast, right breast DCIS. She states this was found on routine mammogram, last mammogram was 2015. She denies any breast injury or trauma.   She states her last mammogram was in 2015, she unusually went to [facility] for her mammograms in the past.   The patient has developed an extensive rash primarily involving the lower extremities but to a lesser extent on the forearms since initiation of a "statin".  Since discontinuation this is gradually improved.   Recently biopsied by Dr. Laurin Coder with a report of telangiectasias and no malignancy.   She states she wears a 38 DD bra.   She is here with her daughter, Kaitlyn Ali and her 76 year old granddaughter, Kaitlyn Ali.   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   Review of Systems  Constitutional:  Negative for chills and fever.  Respiratory:  Negative for cough.          Chief Complaint  Patient presents with   New Patient      BP (!) 146/84   Pulse 106   Temp 36.6 C (97.8 F)   Ht 172.7 cm (_0 )   Wt 87.5 kg (193 lb)   SpO2 96%   BMI 29.35 kg/m        Past Medical History:  Diagnosis Date   Anxiety     Bursitis     Depression     History of chicken pox     Hypertension     Panic attack             Past Surgical History:  Procedure Laterality Date   HYSTERECTOMY   2000    partial   COLONOSCOPY   2015    [place]   core breast biopsy Right 09/27/2021    x 2   BIOPSY BREAST W/ LOC DEVICE PLACEMENT AND STEREOTACTIC GUIDANCE Left 09/27/2021   right great toe surgery       TONSILLECTOMY            OB History       Gravida  2   Para  2    Term      Preterm      AB      Living           SAB      IAB      Ectopic      Molar      Multiple      Live Births           Obstetric Comments  Age at first period 58 Age of first pregnancy 33               Social History           Socioeconomic History   Marital status: Unknown  Tobacco Use   Smoking status: Former      Packs/day: 0.03      Years: 10.00      Pack years: 0.30      Types: Cigarettes      Quit date: 2004      Years  since quitting: 19.5   Smokeless tobacco: Never   Tobacco comments:      social  Substance and Sexual Activity   Alcohol use: Not Currently   Drug use: Never             Allergies  Allergen Reactions   Doxycycline Itching   Nsaids (Non-Steroidal Anti-Inflammatory Drug) Other (See Comments)      Bothers stomach      Current Medications        Current Outpatient Medications  Medication Sig Dispense Refill   diazePAM (VALIUM) 5 MG tablet Take 1 tablet by mouth every 8 (eight) hours as needed       diphenhydrAMINE (BENADRYL) 25 mg capsule Take 25 mg by mouth every 6 (six) hours as needed       oxyCODONE-acetaminophen (PERCOCET) 10-325 mg tablet Take 1 tablet by mouth every 8 (eight) hours as needed       triamcinolone 0.1 % cream Apply 1 Application topically 2 (two) times daily       buPROPion (WELLBUTRIN XL) 300 MG XL tablet Take 1 tablet by mouth every morning (Patient not taking: Reported on 09/30/2021)        No current facility-administered medications for this visit.             Family History  Problem Relation Age of Onset   Diabetes Mother     Liver cancer Father     Breast cancer Sister 58        Negative BRCA testing        Labs and Radiology:    September 27, 2021 biopsy results:   .  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  - CALCIFICATIONS PRESENT ASSOCIATED WITH DCIS.  - NO EVIDENCE OF  INVASIVE CARCINOMA.   B.  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  - CALCIFICATIONS PRESENT ASSOCIATED WITH DCIS.  - NO EVIDENCE OF INVASIVE CARCINOMA.   C.  BREAST, LEFT, 12:00; STEREOTACTIC CORE BIOPSIES:  - NO EVIDENCE OF SIGNIFICANT ATYPIA OR MALIGNANCY.  - FIBROSIS AND USUAL DUCTAL HYPERPLASIA Measurement: Aggregate, 10 x 1.8 x 0.3 cm  0.54 cm3 volume.   Imaging studies from [facility] and [facility] from 2015 through September 27, 2021:   The studies were independently reviewed.  New development of bandlike calcifications in the upper outer quadrant of the right breast.  Questionable distortion left breast.   Postbiopsy images show the area on the left of concern has been biopsied. 3. Appropriate positioning of the ribbon shaped biopsy marker clip the location of the biopsied distortion in the 12 o'clock position of the left breast. The portion of the distortion biopsied is slightly laterally positioned, 1 cm lateral to the center of the distortion. Recommendations by the radiologist to excise a distortion noted.   Recommendations for bilateral breast MRI to assess the extent of calcifications in the right breast is noted.    Objective:   Physical Exam Exam conducted with a chaperone present.  Constitutional:      Appearance: Normal appearance.  Cardiovascular:     Rate and Rhythm: Normal rate and regular rhythm.     Pulses: Normal pulses.     Heart sounds: Normal heart sounds.  Pulmonary:     Effort: Pulmonary effort is normal.     Breath sounds: Normal breath sounds.  Chest:  Breasts:  Right: Normal.     Left: Normal.        Comments: Bilateral biopsy sites without bruising or induration.  No evidence of palpable hematoma. Musculoskeletal:     Cervical back: Neck supple.  Lymphadenopathy:     Upper Body:     Right upper body: No supraclavicular or axillary adenopathy.     Left upper  body: No supraclavicular or axillary adenopathy.  Skin:    General: Skin is warm and dry.  Neurological:     Mental Status: She is alert and oriented to person, place, and time.  Psychiatric:        Mood and Affect: Mood normal.        Behavior: Behavior normal.         Assessment:     High-grade DCIS of the right breast.   Benign parenchyma left breast.    Plan:     The patient was apprised of this early stage 0 cancer and the risk of premature death is 0.   While mastectomy has been done in the past, most women choose breast conservation and that is her desire.   The role for preoperative wire localization for the front and back of the area of calcifications was discussed.  Potential for MRI to overestimate extent of disease and push more patients to more extensive surgery was reviewed.   Potential need for reexcision about 16% nationwide.   Patient is scheduled to meet with medical oncology on October 14, 2021.  She is aware that she may proceed to surgery before this appointment.   Informational brochure provided.      This note is partially prepared by Karie Fetch, RN, acting as a scribe in the presence of Dr. Hervey Ard, MD. . The documentation recorded by the scribe accurately reflects the service I personally performed and the decisions made by me.    Robert Bellow, MD FACS

## 2021-10-04 ENCOUNTER — Other Ambulatory Visit: Payer: Medicare HMO

## 2021-10-04 ENCOUNTER — Ambulatory Visit: Payer: Medicare HMO | Admitting: Oncology

## 2021-10-06 ENCOUNTER — Encounter
Admission: RE | Admit: 2021-10-06 | Discharge: 2021-10-06 | Disposition: A | Discharge status: Home / Self Care | Payer: Medicare HMO | Source: Ambulatory Visit | Attending: General Surgery | Admitting: General Surgery

## 2021-10-06 DIAGNOSIS — Z01818 Encounter for other preprocedural examination: Secondary | ICD-10-CM

## 2021-10-06 DIAGNOSIS — I10 Essential (primary) hypertension: Secondary | ICD-10-CM

## 2021-10-06 HISTORY — DX: Chronic pain syndrome: G89.4

## 2021-10-06 HISTORY — DX: Gastro-esophageal reflux disease without esophagitis: K21.9

## 2021-10-06 NOTE — Patient Instructions (Signed)
Your procedure is scheduled on:10-20-21 Wednesday Check in to [facility] at 10:00 am. Once you leave [place], report to the Registration Desk on the 1st floor of the [facility]. Then proceed to the 2nd floor Surgery Desk  REMEMBER: Instructions that are not followed completely may result in serious medical risk, up to and including death; or upon the discretion of your surgeon and anesthesiologist your surgery may need to be rescheduled.  Do not eat food after midnight the night before surgery.  No gum chewing, lozengers or hard candies.  You may however, drink CLEAR liquids up to 2 hours before you are scheduled to arrive for your surgery. Do not drink anything within 2 hours of your scheduled arrival time.  Clear liquids include: - water  - apple juice without pulp - gatorade (not RED colors) - black coffee or tea (Do NOT add milk or creamers to the coffee or tea) Do NOT drink anything that is not on this list.  TAKE THESE MEDICATIONS THE MORNING OF SURGERY WITH A SIP OF WATER: -oxyCODONE-acetaminophen (PERCOCET)  -You may take diazepam (VALIUM) the day of surgery if needed for anxiety  One week prior to surgery: Stop Anti-inflammatories (NSAIDS) such as Advil, Aleve, Ibuprofen, Motrin, Naproxen, Naprosyn and Aspirin based products such as Excedrin, Goodys Powder, BC Powder.You may however, take Tylenol/Percocet if needed for pain up until the day of surgery.  Stop ANY OVER THE COUNTER supplements/vitamins 7 days prior to surgery  No Alcohol for 24 hours before or after surgery.  No Smoking including e-cigarettes for 24 hours prior to surgery.  No chewable tobacco products for at least 6 hours prior to surgery.  No nicotine patches on the day of surgery.  Do not use any "recreational" drugs for at least a week prior to your surgery.  Please be advised that the combination of cocaine and anesthesia may have negative outcomes, up to and including death. If you test  positive for cocaine, your surgery will be cancelled.  On the morning of surgery brush your teeth with toothpaste and water, you may rinse your mouth with mouthwash if you wish. Do not swallow any toothpaste or mouthwash.  Use CHG Soap as directed on instruction sheet.  Do not wear jewelry, make-up, hairpins, clips or nail polish.  Do not wear lotions, powders, or perfumes.   Do not shave body from the neck down 48 hours prior to surgery just in case you cut yourself which could leave a site for infection.  Also, freshly shaved skin may become irritated if using the CHG soap.  Contact lenses, hearing aids and dentures may not be worn into surgery.  Do not bring valuables to the hospital. [facility] is not responsible for any missing/lost belongings or valuables.   Notify your doctor if there is any change in your medical condition (cold, fever, infection).  Wear comfortable clothing (specific to your surgery type) to the hospital.  After surgery, you can help prevent lung complications by doing breathing exercises.  Take deep breaths and cough every 1-2 hours. Your doctor may order a device called an Incentive Spirometer to help you take deep breaths. When coughing or sneezing, hold a pillow firmly against your incision with both hands. This is called "splinting." Doing this helps protect your incision. It also decreases belly discomfort.  If you are being admitted to the hospital overnight, leave your suitcase in the car. After surgery it may be brought to your room.  If you are being  discharged the day of surgery, you will not be allowed to drive home. You will need a responsible adult (18 years or older) to drive you home and stay with you that night.   If you are taking public transportation, you will need to have a responsible adult (18 years or older) with you. Please confirm with your physician that it is acceptable to use public transportation.   Please call the  [place] [facility] at [phone] if you have any questions about these instructions.  Surgery Visitation Policy:  Patients undergoing a surgery or procedure may have two family members or support persons with them as long as the person is not COVID-19 positive or experiencing its symptoms.   How to Use Chlorhexidine for Bathing Chlorhexidine gluconate (CHG) is a germ-killing (antiseptic) solution that is used to clean the skin. It can get rid of the bacteria that normally live on the skin and can keep them away for about 24 hours. To clean your skin with CHG, you may be given: A CHG solution to use in the shower or as part of a sponge bath. A prepackaged cloth that contains CHG. Cleaning your skin with CHG may help lower the risk for infection: While you are staying in the intensive care unit of the hospital. If you have a vascular access, such as a central line, to provide short-term or long-term access to your veins. If you have a catheter to drain urine from your bladder. If you are on a ventilator. A ventilator is a machine that helps you breathe by moving air in and out of your lungs. After surgery. What are the risks? Risks of using CHG include: A skin reaction. Hearing loss, if CHG gets in your ears and you have a perforated eardrum. Eye injury, if CHG gets in your eyes and is not rinsed out. The CHG product catching fire. Make sure that you avoid smoking and flames after applying CHG to your skin. Do not use CHG: If you have a chlorhexidine allergy or have previously reacted to chlorhexidine. On babies younger than 15 months of age. How to use CHG solution Use CHG only as told by your health care provider, and follow the instructions on the label. Use the full amount of CHG as directed. Usually, this is one bottle. During a shower Follow these steps when using CHG solution during a shower (unless your health care provider gives you different instructions): Start  the shower. Use your normal soap and shampoo to wash your face and hair. Turn off the shower or move out of the shower stream. Pour the CHG onto a clean washcloth. Do not use any type of brush or rough-edged sponge. Starting at your neck, lather your body down to your toes. Make sure you follow these instructions: If you will be having surgery, pay special attention to the part of your body where you will be having surgery. Scrub this area for at least 1 minute. Do not use CHG on your head or face. If the solution gets into your ears or eyes, rinse them well with water. Avoid your genital area. Avoid any areas of skin that have broken skin, cuts, or scrapes. Scrub your back and under your arms. Make sure to wash skin folds. Let the lather sit on your skin for 1-2 minutes or as long as told by your health care provider. Thoroughly rinse your entire body in the shower. Make sure that all body creases and crevices are rinsed well. Dry  off with a clean towel. Do not put any substances on your body afterward--such as powder, lotion, or perfume--unless you are told to do so by your health care provider. Only use lotions that are recommended by the manufacturer. Put on clean clothes or pajamas. If it is the night before your surgery put clean sheets on bed   Contact a health care provider if: Your skin gets irritated after scrubbing. You have questions about using your solution or cloth. You swallow any chlorhexidine. Call your local poison control center (1-(720)790-7870 in the [place] Get help right away if: Your eyes itch badly, or they become very red or swollen. Your skin itches badly and is red or swollen. Your hearing changes. You have trouble seeing. You have swelling or tingling in your mouth or throat. You have trouble breathing. These symptoms may represent a serious problem that is an emergency. Do not wait to see if the symptoms will go away. Get medical help right away. Call your  local emergency services (911 in the [place] Do not drive yourself to the hospital. Summary Chlorhexidine gluconate (CHG) is a germ-killing (antiseptic) solution that is used to clean the skin. Cleaning your skin with CHG may help to lower your risk for infection. You may be given CHG to use for bathing. It may be in a bottle or in a prepackaged cloth to use on your skin. Carefully follow your health care provider's instructions and the instructions on the product label. Do not use CHG if you have a chlorhexidine allergy. Contact your health care provider if your skin gets irritated after scrubbing. This information is not intended to replace advice given to you by your health care provider. Make sure you discuss any questions you have with your health care provider. Document Revised: 06/01/2020 Document Reviewed: 06/01/2020 Elsevier Patient Education  [place].

## 2021-10-11 ENCOUNTER — Encounter
Admission: RE | Admit: 2021-10-11 | Discharge: 2021-10-11 | Disposition: A | Discharge status: Home / Self Care | Payer: Medicare HMO | Source: Ambulatory Visit | Attending: General Surgery | Admitting: General Surgery

## 2021-10-11 DIAGNOSIS — Z01818 Encounter for other preprocedural examination: Secondary | ICD-10-CM

## 2021-10-11 DIAGNOSIS — Z01810 Encounter for preprocedural cardiovascular examination: Secondary | ICD-10-CM | POA: Insufficient documentation

## 2021-10-11 DIAGNOSIS — I10 Essential (primary) hypertension: Secondary | ICD-10-CM

## 2021-10-12 DIAGNOSIS — D0511 Intraductal carcinoma in situ of right breast: Secondary | ICD-10-CM | POA: Insufficient documentation

## 2021-10-12 NOTE — Progress Notes (Signed)
[place]  [phone] [phone]  ID: Kaitlyn Ali OB: [birth year]  MR#: [id number]  HWE#:[id number]  Patient Care Team: Juline Patch, MD as PCP - General (Family Medicine) Daiva Huge, RN as Oncology Nurse Navigator  CHIEF COMPLAINT: DCIS right breast.  INTERVAL HISTORY: Patient is a 66 year old female who has not had a screening mammogram in 8 to 10 years who recently underwent imaging and was found to have both right and left breast abnormality.  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  She currently feels well and is asymptomatic.  She has no neurologic complaints.  She denies any recent fevers or illnesses.  She has a good appetite and denies weight loss.  She has no chest pain, shortness of breath, cough, or hemoptysis.  She denies any nausea, vomiting, constipation, diarrhea.  She has no urinary complaints.  Patient feels at her baseline offers no specific complaints today.  REVIEW OF SYSTEMS:   Review of Systems  Constitutional: Negative.  Negative for fever, malaise/fatigue and weight loss.  Respiratory: Negative.  Negative for cough, hemoptysis and shortness of breath.   Cardiovascular: Negative.  Negative for chest pain and leg swelling.  Gastrointestinal: Negative.  Negative for abdominal pain.  Genitourinary: Negative.  Negative for dysuria.  Musculoskeletal: Negative.  Negative for back pain.  Skin: Negative.  Negative for rash.  Neurological: Negative.  Negative for dizziness, focal weakness, weakness and headaches.  Psychiatric/Behavioral: Negative.  The patient is not nervous/anxious.     As per HPI. Otherwise, a complete review of systems is negative.  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 Davis Gourd   TOE SURGERY     shaved bone in R) great toe   TONSILLECTOMY      FAMILY HISTORY: Family History  Problem Relation Age of Onset   Diabetes Mother    Liver cancer Father    Breast cancer Sister 40       NEG BRCA testing    ADVANCED DIRECTIVES (Y/N):  N  HEALTH MAINTENANCE: Social History   Tobacco Use   Smoking status: Former    Packs/day: 0.25    Years: 15.00    Total pack years: 3.75    Types: Cigarettes    Quit date: 2013    Years since quitting: 10.5   Smokeless tobacco: Never   Tobacco comments:    Social smoker only  Vaping Use   Vaping Use: Never used  Substance Use Topics   Alcohol use: Not Currently   Drug use: No     Colonoscopy:  PAP:  Bone density:  Lipid panel:  Allergies  Allergen Reactions   Doxycycline Itching   Nsaids     Bothers stomach  Current Outpatient Medications  Medication Sig Dispense Refill   diazepam (VALIUM) 5 MG tablet Take 5 mg by mouth 2 (two) times daily as needed for anxiety. Dr Toy Care  1   diphenhydrAMINE (BENADRYL) 25 mg capsule Take 25 mg by mouth every morning.     oxyCODONE-acetaminophen (PERCOCET) 10-325 MG per tablet Take 1 tablet by mouth 3 (three) times daily. [facility] pain management  0   triamcinolone cream (KENALOG) 0.1 % Apply 1 application. topically 2 (two) times daily. (Patient taking differently: Apply 1 application  topically daily at 6 (six) AM.) 30 g 0   hydrochlorothiazide (HYDRODIURIL) 12.5 MG tablet TAKE 1 TABLET(12.5 MG) BY  MOUTH DAILY (Patient not taking: Reported on 10/14/2021) 90 tablet 0   No current facility-administered medications for this visit.    OBJECTIVE: Vitals:   10/14/21 1114  BP: (!) 146/97  Pulse: 72  Resp: 18  Temp: (!) 97.4 F (36.3 C)     Body mass index is 29.5 kg/m.    ECOG FS:0 - Asymptomatic  General: Well-developed, well-nourished, no acute distress. Eyes: Pink conjunctiva, anicteric sclera. HEENT: Normocephalic, moist mucous membranes. Breast: Exam recently performed by another provider. Lungs: No audible wheezing or coughing. Heart: Regular rate and rhythm. Abdomen: Soft, nontender, no obvious distention. Musculoskeletal: No edema, cyanosis, or clubbing. Neuro: Alert, answering all questions appropriately. Cranial nerves grossly intact. Skin: No rashes or petechiae noted. Psych: Normal affect. Lymphatics: No cervical, calvicular, axillary or inguinal LAD.   LAB RESULTS:  Lab Results  Component Value Date   NA 141 09/23/2021   K 4.3 09/23/2021   CL 99 09/23/2021   CO2 23 09/23/2021   GLUCOSE 111 (H) 09/23/2021   BUN 17 09/23/2021   CREATININE 1.10 (H) 09/23/2021   CALCIUM 10.6 (H) 09/23/2021   PROT 7.5 10/11/2017   ALBUMIN 4.9 (H) 09/23/2021   AST 17 10/11/2017   ALKPHOS 70 10/11/2017   BILITOT 0.4 10/11/2017   GFRNONAA 56 (L) 10/11/2017   GFRAA 65 10/11/2017    Lab Results  Component Value Date   WBC 9.3 09/23/2021   NEUTROABS 5.4 09/23/2021   HGB 15.4 09/23/2021   HCT 46.5 09/23/2021   MCV 91 09/23/2021   PLT 344 09/23/2021     STUDIES: MM RT BREAST BX W LOC DEV 1ST LESION IMAGE BX SPEC STEREO GUIDE  Addendum Date: 09/29/2021   ADDENDUM REPORT: 09/29/2021 15:32 ADDENDUM: PATHOLOGY revealed: Site A. 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 - CALCIFICATIONS PRESENT ASSOCIATED WITH DCIS. - NO EVIDENCE OF INVASIVE CARCINOMA. Pathology results are  CONCORDANT with imaging findings, per Dr. Claudie Revering. PATHOLOGY revealed: Site B. 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 - CALCIFICATIONS PRESENT ASSOCIATED WITH DCIS. - NO EVIDENCE OF INVASIVE CARCINOMA. Pathology results are CONCORDANT with imaging findings, per Dr. Claudie Revering. PATHOLOGY revealed: Site C. BREAST, LEFT, 12:00; STEREOTACTIC CORE BIOPSIES: - NO EVIDENCE OF SIGNIFICANT ATYPIA OR MALIGNANCY. - FIBROSIS AND USUAL DUCTAL HYPERPLASIA. Pathology results are DISCORDANT with imaging findings, per Dr. Claudie Revering with excision recommended. Pathology results and recommendations below were discussed with patient by telephone on 09/28/2021. Patient reported biopsy site within normal limits with slight tenderness at the site. Post biopsy care instructions were reviewed, questions were answered and my direct phone number was provided to patient. Patient was instructed to call [facility] if any concerns or questions arise related to the biopsy.  RECOMMENDATIONS: 1. Consider bilateral breast MRI prior to surgery given high grade DCIS and to evaluate extent of breast disease. 2.  Consider excision of Site C (LEFT breast benign lesion). 3. Surgical consultation. Request for surgical consultation relayed to Casper Harrison RN at [facility] by Electa Sniff RN on 09/28/2021. Pathology results reported by Electa Sniff RN on 09/29/2021. Electronically Signed   By: Claudie Revering M.D.   On: 09/29/2021 15:32   Result Date: 09/29/2021 CLINICAL DATA:  5.7 cm area of calcifications suspicious for DCIS in the upper-outer quadrant of the right breast on recent mammography. There was also distortion in the 12 o'clock position of the left breast. Stereotactic guided core needle biopsies of the anterior and posterior extent of the area of 5.7 cm calcifications on the right was recommended as well as stereotactic core needle  biopsy of distortion on the left. EXAM: RIGHT BREAST STEREOTACTIC CORE NEEDLE BIOPSY X 2 LEFT BREAST STEREOTACTIC CORE NEEDLE BIOPSY COMPARISON:  None Available. FINDINGS: The patient and I discussed the procedure of stereotactic-guided biopsy including benefits and alternatives. We discussed the high likelihood of successful procedures. We discussed the risks of the procedures including infection, bleeding, tissue injury, clip migration, and inadequate sampling. Informed written consent was given. The usual time out protocol was performed immediately prior to the procedures. SITE 1: MID LATERAL EXTENT OF THE 5.7 CM AREA OF CALCIFICATIONS IN THE UPPER OUTER RIGHT BREAST Using sterile technique and 1% Lidocaine as local anesthetic, under stereotactic guidance, a 9 gauge vacuum assisted device was used to perform core needle biopsy of the posterolateral extent of the 5.7 cm area of calcifications in the upper-outer right breast using a cephalad approach. Specimen radiograph was performed showing multiple calcifications in all of the specimens. Specimens with calcifications are identified for pathology. Lesion quadrant: Upper outer quadrant At the conclusion of the procedure, a coil shaped tissue marker clip was deployed into the biopsy cavity. SITE 2: ANTERIOR EXTENT OF THE 5.7 CM AREA OF CALCIFICATIONS IN THE UPPER OUTER RIGHT BREAST Using sterile technique and 1% Lidocaine as local anesthetic, under stereotactic guidance, a 9 gauge vacuum assisted device was used to perform core needle biopsy of the anterior extent of the 5.7 cm area of calcifications in the upper-outer right breast using a cephalad approach. Specimen radiograph was performed showing multiple calcifications in multiple specimens. Specimens with calcifications are identified for pathology. Lesion quadrant: Upper outer quadrant At the conclusion of the procedure, an X shaped tissue marker clip was deployed into the biopsy cavity. Follow-up 2-view  mammogram was performed and dictated separately. SITE 3: DISTORTION IN THE 12 O'CLOCK POSITION OF THE LEFT BREAST Using sterile technique and 1% Lidocaine as local anesthetic, under stereotactic guidance, a 9 gauge vacuum assisted device was used to perform core needle biopsy of recently distortion of the recently demonstrated distortion in the 12 o'clock position of the left breast using a cephalad approach. At the conclusion of the procedure, a ribbon shaped tissue marker clip was deployed into the biopsy cavity. Follow-up 2-view mammogram was performed and dictated separately. IMPRESSION: Stereotactic-guided biopsies of the mid lateral and anterior extent of a 5.7 cm area of ossifications in the upper outer right breast and an area of distortion in the 12 o'clock position of the left breast. No apparent complications. Electronically Signed: By: Claudie Revering M.D. On: 09/27/2021 11:07   MM RT BREAST BX W LOC DEV EA AD LESION IMG BX SPEC STEREO GUIDE  Addendum Date: 09/29/2021  ADDENDUM REPORT: 09/29/2021 15:32 ADDENDUM: PATHOLOGY revealed: Site A. 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 - CALCIFICATIONS PRESENT ASSOCIATED WITH DCIS. - NO EVIDENCE OF INVASIVE CARCINOMA. Pathology results are CONCORDANT with imaging findings, per Dr. Claudie Revering. PATHOLOGY revealed: Site B. 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 - CALCIFICATIONS PRESENT ASSOCIATED WITH DCIS. - NO EVIDENCE OF INVASIVE CARCINOMA. Pathology results are CONCORDANT with imaging findings, per Dr. Claudie Revering. PATHOLOGY revealed: Site C. BREAST, LEFT, 12:00; STEREOTACTIC CORE BIOPSIES: - NO EVIDENCE OF SIGNIFICANT ATYPIA OR MALIGNANCY. - FIBROSIS AND USUAL DUCTAL HYPERPLASIA. Pathology results are DISCORDANT with imaging findings, per Dr. Claudie Revering with excision  recommended. Pathology results and recommendations below were discussed with patient by telephone on 09/28/2021. Patient reported biopsy site within normal limits with slight tenderness at the site. Post biopsy care instructions were reviewed, questions were answered and my direct phone number was provided to patient. Patient was instructed to call [facility] if any concerns or questions arise related to the biopsy. RECOMMENDATIONS: 1. Consider bilateral breast MRI prior to surgery given high grade DCIS and to evaluate extent of breast disease. 2.  Consider excision of Site C (LEFT breast benign lesion). 3. Surgical consultation. Request for surgical consultation relayed to Casper Harrison RN at [facility] by Electa Sniff RN on 09/28/2021. Pathology results reported by Electa Sniff RN on 09/29/2021. Electronically Signed   By: Claudie Revering M.D.   On: 09/29/2021 15:32   Result Date: 09/29/2021 CLINICAL DATA:  5.7 cm area of calcifications suspicious for DCIS in the upper-outer quadrant of the right breast on recent mammography. There was also distortion in the 12 o'clock position of the left breast. Stereotactic guided core needle biopsies of the anterior and posterior extent of the area of 5.7 cm calcifications on the right was recommended as well as stereotactic core needle biopsy of distortion on the left. EXAM: RIGHT BREAST STEREOTACTIC CORE NEEDLE BIOPSY X 2 LEFT BREAST STEREOTACTIC CORE NEEDLE BIOPSY COMPARISON:  None Available. FINDINGS: The patient and I discussed the procedure of stereotactic-guided biopsy including benefits and alternatives. We discussed the high likelihood of successful procedures. We discussed the risks of the procedures including infection, bleeding, tissue injury, clip migration, and inadequate sampling. Informed written consent was given. The usual time out protocol was performed immediately prior to the procedures. SITE 1: MID LATERAL EXTENT OF THE 5.7 CM AREA OF  CALCIFICATIONS IN THE UPPER OUTER RIGHT BREAST Using sterile technique and 1% Lidocaine as local anesthetic, under stereotactic guidance, a 9 gauge vacuum assisted device was used to perform core needle biopsy of the posterolateral extent of the 5.7 cm area of calcifications in the upper-outer right breast using a cephalad approach. Specimen radiograph was performed showing multiple calcifications in all of the specimens. Specimens with calcifications are identified for pathology. Lesion quadrant: Upper outer quadrant At the conclusion of the procedure, a coil shaped tissue marker clip was deployed into the biopsy cavity. SITE 2: ANTERIOR EXTENT OF THE 5.7 CM AREA OF CALCIFICATIONS IN THE UPPER OUTER RIGHT BREAST Using sterile technique and 1% Lidocaine as local anesthetic, under stereotactic guidance, a 9 gauge vacuum assisted device was used to perform core needle biopsy of the anterior extent of the 5.7 cm area of calcifications in the upper-outer right breast using a cephalad approach. Specimen radiograph was performed showing multiple calcifications in multiple specimens.  Specimens with calcifications are identified for pathology. Lesion quadrant: Upper outer quadrant At the conclusion of the procedure, an X shaped tissue marker clip was deployed into the biopsy cavity. Follow-up 2-view mammogram was performed and dictated separately. SITE 3: DISTORTION IN THE 12 O'CLOCK POSITION OF THE LEFT BREAST Using sterile technique and 1% Lidocaine as local anesthetic, under stereotactic guidance, a 9 gauge vacuum assisted device was used to perform core needle biopsy of recently distortion of the recently demonstrated distortion in the 12 o'clock position of the left breast using a cephalad approach. At the conclusion of the procedure, a ribbon shaped tissue marker clip was deployed into the biopsy cavity. Follow-up 2-view mammogram was performed and dictated separately. IMPRESSION: Stereotactic-guided biopsies of the  mid lateral and anterior extent of a 5.7 cm area of ossifications in the upper outer right breast and an area of distortion in the 12 o'clock position of the left breast. No apparent complications. Electronically Signed: By: Claudie Revering M.D. On: 09/27/2021 11:07   MM LT BREAST BX W LOC DEV 1ST LESION IMAGE BX SPEC STEREO GUIDE  Addendum Date: 09/29/2021   ADDENDUM REPORT: 09/29/2021 15:32 ADDENDUM: PATHOLOGY revealed: Site A. 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 - CALCIFICATIONS PRESENT ASSOCIATED WITH DCIS. - NO EVIDENCE OF INVASIVE CARCINOMA. Pathology results are CONCORDANT with imaging findings, per Dr. Claudie Revering. PATHOLOGY revealed: Site B. 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 - CALCIFICATIONS PRESENT ASSOCIATED WITH DCIS. - NO EVIDENCE OF INVASIVE CARCINOMA. Pathology results are CONCORDANT with imaging findings, per Dr. Claudie Revering. PATHOLOGY revealed: Site C. BREAST, LEFT, 12:00; STEREOTACTIC CORE BIOPSIES: - NO EVIDENCE OF SIGNIFICANT ATYPIA OR MALIGNANCY. - FIBROSIS AND USUAL DUCTAL HYPERPLASIA. Pathology results are DISCORDANT with imaging findings, per Dr. Claudie Revering with excision recommended. Pathology results and recommendations below were discussed with patient by telephone on 09/28/2021. Patient reported biopsy site within normal limits with slight tenderness at the site. Post biopsy care instructions were reviewed, questions were answered and my direct phone number was provided to patient. Patient was instructed to call [facility] if any concerns or questions arise related to the biopsy. RECOMMENDATIONS: 1. Consider bilateral breast MRI prior to surgery given high grade DCIS and to evaluate extent of breast disease. 2.  Consider excision of Site C (LEFT breast benign lesion). 3. Surgical  consultation. Request for surgical consultation relayed to Casper Harrison RN at [facility] by Electa Sniff RN on 09/28/2021. Pathology results reported by Electa Sniff RN on 09/29/2021. Electronically Signed   By: Claudie Revering M.D.   On: 09/29/2021 15:32   Result Date: 09/29/2021 CLINICAL DATA:  5.7 cm area of calcifications suspicious for DCIS in the upper-outer quadrant of the right breast on recent mammography. There was also distortion in the 12 o'clock position of the left breast. Stereotactic guided core needle biopsies of the anterior and posterior extent of the area of 5.7 cm calcifications on the right was recommended as well as stereotactic core needle biopsy of distortion on the left. EXAM: RIGHT BREAST STEREOTACTIC CORE NEEDLE BIOPSY X 2 LEFT BREAST STEREOTACTIC CORE NEEDLE BIOPSY COMPARISON:  None Available. FINDINGS: The patient and I discussed the procedure of stereotactic-guided biopsy including benefits and alternatives. We discussed the high likelihood of successful procedures. We discussed the risks of the procedures including infection, bleeding, tissue injury, clip migration, and inadequate sampling.  Informed written consent was given. The usual time out protocol was performed immediately prior to the procedures. SITE 1: MID LATERAL EXTENT OF THE 5.7 CM AREA OF CALCIFICATIONS IN THE UPPER OUTER RIGHT BREAST Using sterile technique and 1% Lidocaine as local anesthetic, under stereotactic guidance, a 9 gauge vacuum assisted device was used to perform core needle biopsy of the posterolateral extent of the 5.7 cm area of calcifications in the upper-outer right breast using a cephalad approach. Specimen radiograph was performed showing multiple calcifications in all of the specimens. Specimens with calcifications are identified for pathology. Lesion quadrant: Upper outer quadrant At the conclusion of the procedure, a coil shaped tissue marker clip was deployed into the biopsy cavity. SITE  2: ANTERIOR EXTENT OF THE 5.7 CM AREA OF CALCIFICATIONS IN THE UPPER OUTER RIGHT BREAST Using sterile technique and 1% Lidocaine as local anesthetic, under stereotactic guidance, a 9 gauge vacuum assisted device was used to perform core needle biopsy of the anterior extent of the 5.7 cm area of calcifications in the upper-outer right breast using a cephalad approach. Specimen radiograph was performed showing multiple calcifications in multiple specimens. Specimens with calcifications are identified for pathology. Lesion quadrant: Upper outer quadrant At the conclusion of the procedure, an X shaped tissue marker clip was deployed into the biopsy cavity. Follow-up 2-view mammogram was performed and dictated separately. SITE 3: DISTORTION IN THE 12 O'CLOCK POSITION OF THE LEFT BREAST Using sterile technique and 1% Lidocaine as local anesthetic, under stereotactic guidance, a 9 gauge vacuum assisted device was used to perform core needle biopsy of recently distortion of the recently demonstrated distortion in the 12 o'clock position of the left breast using a cephalad approach. At the conclusion of the procedure, a ribbon shaped tissue marker clip was deployed into the biopsy cavity. Follow-up 2-view mammogram was performed and dictated separately. IMPRESSION: Stereotactic-guided biopsies of the mid lateral and anterior extent of a 5.7 cm area of ossifications in the upper outer right breast and an area of distortion in the 12 o'clock position of the left breast. No apparent complications. Electronically Signed: By: Claudie Revering M.D. On: 09/27/2021 11:07   MM CLIP PLACEMENT RIGHT  Result Date: 09/27/2021 CLINICAL DATA:  Status post 3D stereotactic guided core needle biopsies of the mid lateral and anterior extent of a 5.7 cm area of calcifications in the upper-outer right breast and an area of distortion in the 12 o'clock position of the left breast. EXAM: 3D DIAGNOSTIC BILATERAL MAMMOGRAM POST STEREOTACTIC BIOPSY X  3 COMPARISON:  Previous exam(s). FINDINGS: 3D Mammographic images were obtained following 3D stereotactic guided biopsies of both breasts. The biopsy marking clips are in expected positions at the sites of biopsy. IMPRESSION: 1. Appropriate positioning of the coil shaped biopsy marking clip at the site of biopsy in the posterior outer right breast at the location of the biopsied mid lateral of the 5.7 cm area of calcifications in the upper-outer quadrant. 2. Appropriate positioning of the X shaped biopsy marker clip in the mid outer right breast at the location of the biopsied anterior extent of the 5.7 cm area of calcifications in the upper-outer quadrant. If, based on biopsy and potential MRI results, lumpectomy is considered an option on the right, bracketed localization of the anterior and posterior extent of the residual calcifications in the upper-outer right breast would be recommended. These continue to span 5.7 cm. 3. Appropriate positioning of the ribbon shaped biopsy marker clip the location of the biopsied distortion in the 12  o'clock position of the left breast. The portion of the distortion biopsied is slightly laterally positioned, 1 cm lateral to the center of the distortion. Final Assessment: Post Procedure Mammograms for Marker Placement Electronically Signed   By: Claudie Revering M.D.   On: 09/27/2021 11:21  MM CLIP PLACEMENT LEFT  Result Date: 09/27/2021 CLINICAL DATA:  Status post 3D stereotactic guided core needle biopsies of the mid lateral and anterior extent of a 5.7 cm area of calcifications in the upper-outer right breast and an area of distortion in the 12 o'clock position of the left breast. EXAM: 3D DIAGNOSTIC BILATERAL MAMMOGRAM POST STEREOTACTIC BIOPSY X 3 COMPARISON:  Previous exam(s). FINDINGS: 3D Mammographic images were obtained following 3D stereotactic guided biopsies of both breasts. The biopsy marking clips are in expected positions at the sites of biopsy. IMPRESSION: 1.  Appropriate positioning of the coil shaped biopsy marking clip at the site of biopsy in the posterior outer right breast at the location of the biopsied mid lateral of the 5.7 cm area of calcifications in the upper-outer quadrant. 2. Appropriate positioning of the X shaped biopsy marker clip in the mid outer right breast at the location of the biopsied anterior extent of the 5.7 cm area of calcifications in the upper-outer quadrant. If, based on biopsy and potential MRI results, lumpectomy is considered an option on the right, bracketed localization of the anterior and posterior extent of the residual calcifications in the upper-outer right breast would be recommended. These continue to span 5.7 cm. 3. Appropriate positioning of the ribbon shaped biopsy marker clip the location of the biopsied distortion in the 12 o'clock position of the left breast. The portion of the distortion biopsied is slightly laterally positioned, 1 cm lateral to the center of the distortion. Final Assessment: Post Procedure Mammograms for Marker Placement Electronically Signed   By: Claudie Revering M.D.   On: 09/27/2021 11:21   ASSESSMENT: DCIS right breast.  PLAN:    DCIS right breast: No invasive component was noted on biopsy.  Patient is scheduled for lumpectomy next week.  She likely will not require chemotherapy, but will benefit from adjuvant XRT followed by tamoxifen for 5 years.  Return to clinic 2 weeks postoperatively to discuss her final pathology results and additional treatment planning.  She will also have consultation with radiation oncology on that day.  I spent a total of 60 minutes reviewing chart data, face-to-face evaluation with the patient, counseling and coordination of care as detailed above.   Patient expressed understanding and was in agreement with this plan. She also understands that She can call clinic at any time with any questions, concerns, or complaints.    Cancer Staging  Ductal carcinoma in situ  (DCIS) of right breast Staging form: Breast, AJCC 8th Edition - Clinical stage from 10/15/2021: Stage 0 (cTis (DCIS), cN0, cM0, ER+, PR: Not Assessed, HER2: Not Assessed) - Signed by Lloyd Huger, MD on 10/15/2021 Stage prefix: Initial diagnosis Nuclear grade: G3   Lloyd Huger, MD   10/15/2021 10:56 AM

## 2021-10-14 ENCOUNTER — Inpatient Hospital Stay: Payer: Medicare HMO | Attending: Oncology | Admitting: Oncology

## 2021-10-14 ENCOUNTER — Inpatient Hospital Stay: Payer: Medicare HMO

## 2021-10-14 ENCOUNTER — Encounter: Payer: Self-pay | Admitting: Oncology

## 2021-10-14 ENCOUNTER — Other Ambulatory Visit: Payer: Self-pay | Admitting: Medical Oncology

## 2021-10-14 ENCOUNTER — Encounter: Payer: Self-pay | Admitting: *Deleted

## 2021-10-14 DIAGNOSIS — Z79899 Other long term (current) drug therapy: Secondary | ICD-10-CM | POA: Insufficient documentation

## 2021-10-14 DIAGNOSIS — Z803 Family history of malignant neoplasm of breast: Secondary | ICD-10-CM | POA: Diagnosis not present

## 2021-10-14 DIAGNOSIS — D0511 Intraductal carcinoma in situ of right breast: Secondary | ICD-10-CM | POA: Insufficient documentation

## 2021-10-14 DIAGNOSIS — F17210 Nicotine dependence, cigarettes, uncomplicated: Secondary | ICD-10-CM | POA: Diagnosis not present

## 2021-10-14 DIAGNOSIS — G894 Chronic pain syndrome: Secondary | ICD-10-CM | POA: Insufficient documentation

## 2021-10-14 DIAGNOSIS — F419 Anxiety disorder, unspecified: Secondary | ICD-10-CM | POA: Diagnosis not present

## 2021-10-14 DIAGNOSIS — I10 Essential (primary) hypertension: Secondary | ICD-10-CM | POA: Insufficient documentation

## 2021-10-14 NOTE — Research (Addendum)
This Nurse has reviewed this patient's inclusion and exclusion criteria as a second review and confirms Kaitlyn Ali is eligible for study participation.  Patient may continue with enrollment.   Maxwell Marion, RN, BSN, [place] Clinical Research Nurse Lead 10/14/2021 12:44 PM    Trial:  [id number] - Tissue and Bodily Fluids: Translational Medicine: Discovery and Evaluation of Biomarkers/Pharmacogenomics for the Diagnosis and Personalized Management of Patients  Patient Kaitlyn Ali was identified by Jeral Fruit, RN as a potential candidate for the above listed study.  This Clinical Research Nurse met with Kaitlyn Ali, [id number], on 10/14/21 in a manner and location that ensures patient privacy to discuss participation in the above listed research study.  Patient is Accompanied by her daughter .  A copy of the informed consent document and separate HIPAA Authorization was provided to the patient.  Patient reads, speaks, and understands [place].    Patient was provided with the business card of this Nurse and encouraged to contact the research team with any questions.  Patient was provided the option of taking informed consent documents home to review and was encouraged to review at their convenience with their support network, including other care providers. Patient is comfortable with making a decision regarding study participation today.  As outlined in the informed consent form, this Nurse and Huyen Riggs Ali discussed the purpose of the research study, the investigational nature of the study, study procedures and requirements for study participation, potential risks and benefits of study participation, as well as alternatives to participation. This study is not blinded. The patient understands participation is voluntary and they may withdraw from study participation at any time.  This study does not involve randomization.  This study does not involve an investigational  drug or device. This study does not involve a placebo. Patient understands enrollment is pending full eligibility review.   Confidentiality and how the patient's information will be used as part of study participation were discussed.  Patient was informed there is reimbursement provided for their time and effort spent on trial participation.  The patient is encouraged to discuss research study participation with their insurance provider to determine what costs they may incur as part of study participation, including research related injury.    All questions were answered to patient's satisfaction.  The informed consent and separate HIPAA Authorization was reviewed page by page.  The patient's mental and emotional status is appropriate to provide informed consent, and the patient verbalizes an understanding of study participation.  Patient has agreed to participate in the above listed research study and has voluntarily signed the informed consent 02/22/2021 version #5 and separate HIPAA Authorization, version 5  on 10/14/21 at 1200PM.  The patient was provided with a copy of the signed informed consent form and separate HIPAA Authorization for their reference.  No study specific procedures were obtained prior to the signing of the informed consent document.  Approximately 30 minutes were spent with the patient reviewing the informed consent documents.  After obtaining informed consent patient, voluntarily signed the optional Release of Information form for use throughout trial participation.   The following social, medical, and family history was collected from the patient on 10/14/21.  Date and time of last meal prior to blood collection: 10/13/2021  Does the patient drink alcohol? No  Does the patient use tobacco products? Former.  Date quit:  ; Number of years smoked: 20; Packs per day 0.2.  Menopausal status? postmenopausal  Date of last menstrual cycle?  Hysterectomy  Does the patient have family  history of cancer in immediate family (grandparents, parents, and/or siblings)? Yes   If yes, what was the relationship, cancer type, date of diagnosis, and outcome for each? Father / Liver, Sister / Breast x 2  Has the patient received a COVID-19 vaccination? No   If yes, vaccine manufacturer?   Number of doses received?   Date of last dose?   Has the patient ever tested positive for COVID? No   If yes, date of last positive COVID test?   Date of last COVID test taken?   Has the patient received any other vaccines in the past year? No   If yes, which vaccines and dates?   The patient's medication list was reviewed with the patient and it was correct and start dates were verified. Has the patient stopped any medications in the past 30 days? No         Jeral Fruit, RN 10/14/21 12:39 PM

## 2021-10-14 NOTE — Progress Notes (Signed)
Accompanied patient and daughter to initial medical oncology appointment.   Reviewed Breast Cancer treatment handbook.   Care plan summary given to patient.

## 2021-10-14 NOTE — Progress Notes (Signed)
Patient here today for new evaluation regarding DCIS.

## 2021-10-20 ENCOUNTER — Ambulatory Visit: Payer: Medicare HMO | Admitting: Anesthesiology

## 2021-10-20 ENCOUNTER — Ambulatory Visit
Admission: RE | Admit: 2021-10-20 | Discharge: 2021-10-20 | Disposition: A | Discharge status: Home / Self Care | Payer: Medicare HMO | Attending: General Surgery | Admitting: General Surgery

## 2021-10-20 ENCOUNTER — Encounter
Admission: RE | Disposition: A | Discharge status: Home / Self Care | Payer: Self-pay | Source: Home / Self Care | Attending: General Surgery

## 2021-10-20 ENCOUNTER — Ambulatory Visit
Admission: RE | Admit: 2021-10-20 | Discharge: 2021-10-20 | Disposition: A | Discharge status: Home / Self Care | Payer: Medicare HMO | Source: Ambulatory Visit | Attending: General Surgery | Admitting: General Surgery

## 2021-10-20 ENCOUNTER — Other Ambulatory Visit: Payer: Self-pay

## 2021-10-20 ENCOUNTER — Other Ambulatory Visit: Payer: Self-pay | Admitting: General Surgery

## 2021-10-20 ENCOUNTER — Encounter: Payer: Self-pay | Admitting: General Surgery

## 2021-10-20 DIAGNOSIS — D0511 Intraductal carcinoma in situ of right breast: Secondary | ICD-10-CM

## 2021-10-20 DIAGNOSIS — G894 Chronic pain syndrome: Secondary | ICD-10-CM | POA: Diagnosis not present

## 2021-10-20 DIAGNOSIS — Z79891 Long term (current) use of opiate analgesic: Secondary | ICD-10-CM | POA: Insufficient documentation

## 2021-10-20 DIAGNOSIS — C50411 Malignant neoplasm of upper-outer quadrant of right female breast: Secondary | ICD-10-CM | POA: Diagnosis not present

## 2021-10-20 DIAGNOSIS — I10 Essential (primary) hypertension: Secondary | ICD-10-CM | POA: Diagnosis not present

## 2021-10-20 DIAGNOSIS — Z87891 Personal history of nicotine dependence: Secondary | ICD-10-CM | POA: Insufficient documentation

## 2021-10-20 DIAGNOSIS — F410 Panic disorder [episodic paroxysmal anxiety] without agoraphobia: Secondary | ICD-10-CM | POA: Diagnosis not present

## 2021-10-20 DIAGNOSIS — C50811 Malignant neoplasm of overlapping sites of right female breast: Secondary | ICD-10-CM | POA: Diagnosis not present

## 2021-10-20 HISTORY — PX: OTHER SURGICAL HISTORY: SHX169

## 2021-10-20 HISTORY — PX: BREAST LUMPECTOMY WITH NEEDLE LOCALIZATION: SHX5759

## 2021-10-20 SURGERY — BREAST LUMPECTOMY WITH NEEDLE LOCALIZATION
Anesthesia: General | Laterality: Right

## 2021-10-20 MED ORDER — BUPIVACAINE-EPINEPHRINE (PF) 0.5% -1:200000 IJ SOLN
INTRAMUSCULAR | Status: AC
  Filled 2021-10-20: qty 30

## 2021-10-20 MED ORDER — PROPOFOL 1000 MG/100ML IV EMUL
INTRAVENOUS | Status: AC
  Filled 2021-10-20: qty 100

## 2021-10-20 MED ORDER — OXYCODONE HCL 5 MG PO TABS
ORAL_TABLET | ORAL | Status: AC
  Filled 2021-10-20: qty 1

## 2021-10-20 MED ORDER — ONDANSETRON HCL 4 MG/2ML IJ SOLN
INTRAMUSCULAR | Status: DC | PRN
  Administered 2021-10-20: 4 mg via INTRAVENOUS

## 2021-10-20 MED ORDER — DROPERIDOL 2.5 MG/ML IJ SOLN: 0.6250 mg | Freq: Once | INTRAMUSCULAR | Status: DC | PRN

## 2021-10-20 MED ORDER — BUPIVACAINE-EPINEPHRINE (PF) 0.5% -1:200000 IJ SOLN
INTRAMUSCULAR | Status: DC | PRN
  Administered 2021-10-20: 30 mL

## 2021-10-20 MED ORDER — LACTATED RINGERS IV SOLN: INTRAVENOUS | Status: DC

## 2021-10-20 MED ORDER — PHENYLEPHRINE HCL (PRESSORS) 10 MG/ML IV SOLN
INTRAVENOUS | Status: DC | PRN
  Administered 2021-10-20 (×3): 80 ug via INTRAVENOUS

## 2021-10-20 MED ORDER — CHLORHEXIDINE GLUCONATE CLOTH 2 % EX PADS: 6.0000 | MEDICATED_PAD | Freq: Once | CUTANEOUS | Status: DC

## 2021-10-20 MED ORDER — KETOROLAC TROMETHAMINE 30 MG/ML IJ SOLN
INTRAMUSCULAR | Status: DC | PRN
  Administered 2021-10-20: 15 mg via INTRAVENOUS

## 2021-10-20 MED ORDER — ACETAMINOPHEN 500 MG PO TABS
ORAL_TABLET | ORAL | Status: AC
  Administered 2021-10-20: 1000 mg via ORAL
  Filled 2021-10-20: qty 2

## 2021-10-20 MED ORDER — FENTANYL CITRATE (PF) 100 MCG/2ML IJ SOLN
INTRAMUSCULAR | Status: AC
  Administered 2021-10-20: 50 ug via INTRAVENOUS
  Filled 2021-10-20: qty 2

## 2021-10-20 MED ORDER — FENTANYL CITRATE (PF) 100 MCG/2ML IJ SOLN
INTRAMUSCULAR | Status: AC
  Filled 2021-10-20: qty 2

## 2021-10-20 MED ORDER — DEXAMETHASONE SODIUM PHOSPHATE 10 MG/ML IJ SOLN
INTRAMUSCULAR | Status: AC
  Filled 2021-10-20: qty 1

## 2021-10-20 MED ORDER — FENTANYL CITRATE (PF) 100 MCG/2ML IJ SOLN
INTRAMUSCULAR | Status: DC | PRN
Start: 2021-10-20 — End: 2021-10-20
  Administered 2021-10-20: 25 ug via INTRAVENOUS
  Administered 2021-10-20: 50 ug via INTRAVENOUS
  Administered 2021-10-20: 25 ug via INTRAVENOUS

## 2021-10-20 MED ORDER — KETAMINE HCL 10 MG/ML IJ SOLN
INTRAMUSCULAR | Status: DC | PRN
  Administered 2021-10-20: 20 mg via INTRAVENOUS

## 2021-10-20 MED ORDER — PROPOFOL 10 MG/ML IV BOLUS
INTRAVENOUS | Status: DC | PRN
  Administered 2021-10-20: 150 mg via INTRAVENOUS

## 2021-10-20 MED ORDER — DEXAMETHASONE SODIUM PHOSPHATE 10 MG/ML IJ SOLN
INTRAMUSCULAR | Status: DC | PRN
  Administered 2021-10-20: 10 mg via INTRAVENOUS

## 2021-10-20 MED ORDER — PROMETHAZINE HCL 25 MG/ML IJ SOLN: 6.2500 mg | INTRAMUSCULAR | Status: DC | PRN

## 2021-10-20 MED ORDER — MIDAZOLAM HCL 2 MG/2ML IJ SOLN
INTRAMUSCULAR | Status: DC | PRN
  Administered 2021-10-20: 2 mg via INTRAVENOUS

## 2021-10-20 MED ORDER — LIDOCAINE HCL (PF) 2 % IJ SOLN
INTRAMUSCULAR | Status: AC
  Filled 2021-10-20: qty 5

## 2021-10-20 MED ORDER — FAMOTIDINE 20 MG PO TABS: 20.0000 mg | ORAL_TABLET | Freq: Once | ORAL | Status: AC

## 2021-10-20 MED ORDER — PHENYLEPHRINE 80 MCG/ML (10ML) SYRINGE FOR IV PUSH (FOR BLOOD PRESSURE SUPPORT)
PREFILLED_SYRINGE | INTRAVENOUS | Status: AC
  Filled 2021-10-20: qty 10

## 2021-10-20 MED ORDER — ACETAMINOPHEN 10 MG/ML IV SOLN: 1000.0000 mg | Freq: Once | INTRAVENOUS | Status: DC | PRN

## 2021-10-20 MED ORDER — PROPOFOL 10 MG/ML IV BOLUS
INTRAVENOUS | Status: AC
  Filled 2021-10-20: qty 20

## 2021-10-20 MED ORDER — MIDAZOLAM HCL 2 MG/2ML IJ SOLN
INTRAMUSCULAR | Status: AC
  Filled 2021-10-20: qty 2

## 2021-10-20 MED ORDER — ONDANSETRON HCL 4 MG/2ML IJ SOLN
INTRAMUSCULAR | Status: AC
  Filled 2021-10-20: qty 2

## 2021-10-20 MED ORDER — KETOROLAC TROMETHAMINE 30 MG/ML IJ SOLN
INTRAMUSCULAR | Status: AC
  Filled 2021-10-20: qty 1

## 2021-10-20 MED ORDER — OXYCODONE HCL 5 MG/5ML PO SOLN: 5.0000 mg | Freq: Once | ORAL | Status: AC | PRN

## 2021-10-20 MED ORDER — OXYCODONE HCL 5 MG PO TABS
5.0000 mg | ORAL_TABLET | Freq: Once | ORAL | Status: AC | PRN
  Administered 2021-10-20: 5 mg via ORAL

## 2021-10-20 MED ORDER — KETAMINE HCL 50 MG/5ML IJ SOSY
PREFILLED_SYRINGE | INTRAMUSCULAR | Status: AC
  Filled 2021-10-20: qty 5

## 2021-10-20 MED ORDER — ACETAMINOPHEN 10 MG/ML IV SOLN
INTRAVENOUS | Status: AC
  Filled 2021-10-20: qty 100

## 2021-10-20 MED ORDER — CHLORHEXIDINE GLUCONATE 0.12 % MT SOLN: 15.0000 mL | Freq: Once | OROMUCOSAL | Status: AC

## 2021-10-20 MED ORDER — DEXMEDETOMIDINE HCL IN NACL 200 MCG/50ML IV SOLN
INTRAVENOUS | Status: DC | PRN
  Administered 2021-10-20: 8 ug via INTRAVENOUS
  Administered 2021-10-20 (×2): 4 ug via INTRAVENOUS

## 2021-10-20 MED ORDER — FENTANYL CITRATE (PF) 100 MCG/2ML IJ SOLN
25.0000 ug | INTRAMUSCULAR | Status: DC | PRN
  Administered 2021-10-20: 50 ug via INTRAVENOUS

## 2021-10-20 MED ORDER — FAMOTIDINE 20 MG PO TABS
ORAL_TABLET | ORAL | Status: AC
  Administered 2021-10-20: 20 mg via ORAL
  Filled 2021-10-20: qty 1

## 2021-10-20 MED ORDER — CHLORHEXIDINE GLUCONATE 0.12 % MT SOLN
OROMUCOSAL | Status: AC
  Administered 2021-10-20: 15 mL via OROMUCOSAL
  Filled 2021-10-20: qty 15

## 2021-10-20 MED ORDER — LIDOCAINE HCL (CARDIAC) PF 100 MG/5ML IV SOSY
PREFILLED_SYRINGE | INTRAVENOUS | Status: DC | PRN
  Administered 2021-10-20: 100 mg via INTRAVENOUS

## 2021-10-20 MED ORDER — ORAL CARE MOUTH RINSE: 15.0000 mL | Freq: Once | OROMUCOSAL | Status: AC

## 2021-10-20 MED ORDER — OXYCODONE HCL 5 MG PO TABS: 5.0000 mg | ORAL_TABLET | ORAL | 0 refills | Status: DC | PRN

## 2021-10-20 MED ORDER — ACETAMINOPHEN 500 MG PO TABS: 1000.0000 mg | ORAL_TABLET | Freq: Once | ORAL | Status: AC

## 2021-10-20 SURGICAL SUPPLY — 64 items
APL PRP STRL LF DISP 70% ISPRP (MISCELLANEOUS) ×1
APPLIER CLIP 11 MED OPEN (CLIP) ×2
APR CLP MED 11 20 MLT OPN (CLIP) ×1
BLADE BOVIE TIP EXT 4 (BLADE) ×1 IMPLANT
BLADE SURG 15 STRL SS SAFETY (BLADE) ×4 IMPLANT
BULB RESERV EVAC DRAIN JP 100C (MISCELLANEOUS) IMPLANT
CHLORAPREP W/TINT 26 (MISCELLANEOUS) ×2 IMPLANT
CLIP APPLIE 11 MED OPEN (CLIP) IMPLANT
CNTNR SPEC 2.5X3XGRAD LEK (MISCELLANEOUS)
CONT SPEC 4OZ STER OR WHT (MISCELLANEOUS)
CONT SPEC 4OZ STRL OR WHT (MISCELLANEOUS)
CONTAINER SPEC 2.5X3XGRAD LEK (MISCELLANEOUS) IMPLANT
COVER PROBE FLX POLY STRL (MISCELLANEOUS) ×2 IMPLANT
COVER PROBE GAMMA FINDER SLV (MISCELLANEOUS) IMPLANT
DEVICE DUBIN SPECIMEN MAMMOGRA (MISCELLANEOUS) ×2 IMPLANT
DRAIN CHANNEL JP 15F RND 16 (MISCELLANEOUS) IMPLANT
DRAPE LAPAROTOMY TRNSV 106X77 (MISCELLANEOUS) ×2 IMPLANT
DRSG GAUZE FLUFF 36X18 (GAUZE/BANDAGES/DRESSINGS) ×4 IMPLANT
DRSG TELFA 3X8 NADH (GAUZE/BANDAGES/DRESSINGS) ×2 IMPLANT
ELECT CAUTERY BLADE TIP 2.5 (TIP) ×2
ELECT REM PT RETURN 9FT ADLT (ELECTROSURGICAL) ×2
ELECTRODE CAUTERY BLDE TIP 2.5 (TIP) ×1 IMPLANT
ELECTRODE REM PT RTRN 9FT ADLT (ELECTROSURGICAL) ×1 IMPLANT
GAUZE 4X4 16PLY ~~LOC~~+RFID DBL (SPONGE) ×2 IMPLANT
GLOVE BIO SURGEON STRL SZ7.5 (GLOVE) ×2 IMPLANT
GLOVE SURG UNDER LTX SZ8 (GLOVE) ×4 IMPLANT
GOWN STRL REUS W/ TWL LRG LVL3 (GOWN DISPOSABLE) ×2 IMPLANT
GOWN STRL REUS W/TWL LRG LVL3 (GOWN DISPOSABLE) ×4
KIT MARKER MARGIN INK (KITS) ×1 IMPLANT
KIT TURNOVER KIT A (KITS) ×2 IMPLANT
LABEL OR SOLS (LABEL) ×2 IMPLANT
MANIFOLD NEPTUNE II (INSTRUMENTS) ×2 IMPLANT
MARGIN MAP 10MM (MISCELLANEOUS) ×2 IMPLANT
NDL HYPO 25X1 1.5 SAFETY (NEEDLE) ×2 IMPLANT
NDL SPNL 20GX3.5 QUINCKE YW (NEEDLE) IMPLANT
NEEDLE HYPO 22GX1.5 SAFETY (NEEDLE) ×2 IMPLANT
NEEDLE HYPO 25X1 1.5 SAFETY (NEEDLE) ×4 IMPLANT
NEEDLE SPNL 20GX3.5 QUINCKE YW (NEEDLE) IMPLANT
PACK BASIN MINOR ARMC (MISCELLANEOUS) ×2 IMPLANT
PAD DRESSING TELFA 3X8 NADH (GAUZE/BANDAGES/DRESSINGS) ×1 IMPLANT
PENCIL ELECTRO HAND CTR (MISCELLANEOUS) ×2 IMPLANT
RETRACTOR RING XSMALL (MISCELLANEOUS) IMPLANT
RTRCTR WOUND ALEXIS 13CM XS SH (MISCELLANEOUS)
SHEARS FOC LG CVD HARMONIC 17C (MISCELLANEOUS) IMPLANT
SHEARS HARMONIC 9CM CVD (BLADE) IMPLANT
STRIP CLOSURE SKIN 1/2X4 (GAUZE/BANDAGES/DRESSINGS) ×2 IMPLANT
SUT ETHILON 3-0 FS-10 30 BLK (SUTURE) ×2
SUT SILK 2 0 (SUTURE) ×2
SUT SILK 2-0 18XBRD TIE 12 (SUTURE) ×1 IMPLANT
SUT VIC AB 2-0 CT1 27 (SUTURE) ×6
SUT VIC AB 2-0 CT1 TAPERPNT 27 (SUTURE) ×2 IMPLANT
SUT VIC AB 3-0 54X BRD REEL (SUTURE) ×1 IMPLANT
SUT VIC AB 3-0 BRD 54 (SUTURE) ×2
SUT VIC AB 3-0 SH 27 (SUTURE) ×4
SUT VIC AB 3-0 SH 27X BRD (SUTURE) ×2 IMPLANT
SUT VIC AB 4-0 FS2 27 (SUTURE) ×4 IMPLANT
SUTURE EHLN 3-0 FS-10 30 BLK (SUTURE) ×1 IMPLANT
SWABSTK COMLB BENZOIN TINCTURE (MISCELLANEOUS) ×2 IMPLANT
SYR 10ML LL (SYRINGE) ×2 IMPLANT
SYR BULB IRRIG 60ML STRL (SYRINGE) ×2 IMPLANT
TAPE TRANSPORE STRL 2 31045 (GAUZE/BANDAGES/DRESSINGS) ×2 IMPLANT
TRAP NEPTUNE SPECIMEN COLLECT (MISCELLANEOUS) ×2 IMPLANT
WATER STERILE IRR 1000ML POUR (IV SOLUTION) ×2 IMPLANT
WATER STERILE IRR 500ML POUR (IV SOLUTION) ×2 IMPLANT

## 2021-10-20 NOTE — Op Note (Signed)
Preoperative diagnosis: Extensive DCIS of the right upper outer quadrant.  Postoperative diagnosis: Same.  Operative procedure: Wire and ultrasound guided excision of right upper outer quadrant DCIS, tissue transfer 15 cm.  Operating surgeon: Hervey Ard, MD.  Anesthesia: General by LMA, Marcaine 0.5% with 1: 200,000 units of epinephrine, 30 cc.  Estimated blood loss: 10 cc.  Clinical note: This 66 year old woman had an area of microcalcifications identified on mammography and showed evidence of DCIS.  This was a fairly wide area covering about 4 x 5 x 7 cm.  She underwent wire localization x3 this morning by Margarette Canada, MD from radiology with excellent bracketing of the area of concern.  She is brought to the operating for planned excision.  The patient had SCD stockings for DVT prevention.  Operative note: The patient underwent general anesthesia and tolerated this well.  The breast was cleansed with ChloraPrep and draped.  Ultrasound was used to identify and the medial edge of the nerves which entered the breast laterally wire to establish the medial extent of dissection.  Once this was completed local anesthesia was infiltrated.  A curvilinear incision in the upper outer quadrant was made.  The skin was incised sharply and extended down to the top of the breast parenchyma.  This was then elevated circumferentially by about 5 cm.  An extra small Alexis wound protector was placed.  3 wires were brought into the field.  A block of tissue extending from about 3 cm from the nipple to the 10 o'clock position of the upper outer quadrant was then excised.  Hemostasis was electrocautery.  The specimen was inked and the specimen radiograph confirmed the 2 previously placed clips and 3 intact wires.  The cavity was excised exposed and good hemostasis was noted.  The breast was then elevated off the underlying pectoralis muscle with the fascia circumferentially for approximately 5 cm.  Medium clips were  placed at the base of the wound on the pectoralis fashion.  This was then approximated with interrupted 2-0 Vicryl figure-of-eight sutures in a radial fashion.  The next layer was closed transversely with similar sutures.  There was some tethering of the breast skin near the axillary tail and the adipose layer was divided here to smooth this out.  A second series of clips were placed just below the adipose layer on the breast parenchyma for radiation targeting.  The last layer of adipose tissue was closed with interrupted 2-0 Vicryl figure-of-eight sutures.  The skin was closed with a running 4-0 Vicryl subcuticular suture.  Benzoin and Steri-Strips followed by Telfa, fluff gauze and a compressive wrap were placed.  Patient tolerated procedure well and was taken to the PACU in stable condition.

## 2021-10-20 NOTE — Anesthesia Procedure Notes (Signed)
Procedure Name: LMA Insertion Date/Time: 10/20/2021 1:22 PM  Performed by: Jonna Clark, CRNAPre-anesthesia Checklist: Patient identified, Patient being monitored, Timeout performed, Emergency Drugs available and Suction available Patient Re-evaluated:Patient Re-evaluated prior to induction Oxygen Delivery Method: Circle system utilized Preoxygenation: Pre-oxygenation with 100% oxygen Induction Type: IV induction Ventilation: Mask ventilation without difficulty LMA: LMA inserted LMA Size: 4.0 Tube type: Oral Number of attempts: 1 Placement Confirmation: positive ETCO2 and breath sounds checked- equal and bilateral Tube secured with: Tape Dental Injury: Teeth and Oropharynx as per pre-operative assessment

## 2021-10-20 NOTE — H&P (Signed)
Kaitlyn Ali [id number] 06-04-1955     HPI:  Healthy 66 y/o with an area of DCIS. She desires breast conservation. Tolerated wire localization well.   Medications Prior to Admission  Medication Sig Dispense Refill Last Dose   diazepam (VALIUM) 5 MG tablet Take 5 mg by mouth 2 (two) times daily as needed for anxiety. Dr Toy Care  1 10/20/2021 at 0700   diphenhydrAMINE (BENADRYL) 25 mg capsule Take 25 mg by mouth every morning.   10/19/2021   oxyCODONE-acetaminophen (PERCOCET) 10-325 MG per tablet Take 1 tablet by mouth 3 (three) times daily. Heag pain management  0 10/20/2021 at 0700   hydrochlorothiazide (HYDRODIURIL) 12.5 MG tablet TAKE 1 TABLET(12.5 MG) BY MOUTH DAILY (Patient not taking: Reported on 10/14/2021) 90 tablet 0    triamcinolone cream (KENALOG) 0.1 % Apply 1 application. topically 2 (two) times daily. (Patient taking differently: Apply 1 application  topically daily at 6 (six) AM.) 30 g 0    Allergies  Allergen Reactions   Doxycycline Itching   Nsaids     Bothers stomach   Past Medical History:  Diagnosis Date   Anxiety    Bursitis    Chronic pain syndrome    DCIS (ductal carcinoma in situ) of breast 09/2021   RT breast   Depression    GERD (gastroesophageal reflux disease)    h/o with pregnancy   Hypertension    Panic attack    Past Surgical History:  Procedure Laterality Date   BREAST BIOPSY Right 09/27/2021   UOQ mid lateral calcs, coil marker, DCIS   BREAST BIOPSY Right 09/27/2021   UOQ calcs anterior, X marker, DCIS   BREAST BIOPSY Left 09/27/2021   Distortion, ribbon marker, FIBROSIS AND USUAL DUCTAL HYPERPLASIA   LAPAROSCOPIC HYSTERECTOMY     supracervical hyst due to AUB; Dr. Davis Gourd   TOE SURGERY     shaved bone in R) great toe   TONSILLECTOMY     wire placement Right 10/20/2021   bracking for lumpectomy   Social History   Socioeconomic History   Marital status: Single    Spouse name: Not on file   Number of children: Not on file   Years  of education: Not on file   Highest education level: Not on file  Occupational History   Not on file  Tobacco Use   Smoking status: Former    Packs/day: 0.25    Years: 15.00    Total pack years: 3.75    Types: Cigarettes    Quit date: 2013    Years since quitting: 10.5   Smokeless tobacco: Never   Tobacco comments:    Social smoker only  Media planner   Vaping Use: Never used  Substance and Sexual Activity   Alcohol use: Not Currently   Drug use: No   Sexual activity: Yes  Other Topics Concern   Not on file  Social History Narrative   Not on file   Social Determinants of Health   Financial Resource Strain: Not on file  Food Insecurity: Not on file  Transportation Needs: Not on file  Physical Activity: Not on file  Stress: Not on file  Social Connections: Not on file  Intimate Partner Violence: Not on file   Social History   Social History Narrative   Not on file     ROS: Negative.     PE: HEENT: Negative. Lungs: Clear. Cardio: RR.   Assessment/Plan:  Proceed with planned excision right breast DCIS.  Robert Bellow  10/20/2021    

## 2021-10-20 NOTE — Anesthesia Preprocedure Evaluation (Signed)
Anesthesia Evaluation  Patient identified by MRN, date of birth, ID band Patient awake    Reviewed: Allergy & Precautions, NPO status , Patient's Chart, lab work & pertinent test results  Airway Mallampati: III  TM Distance: >3 FB Neck ROM: full    Dental  (+) Missing   Pulmonary neg pulmonary ROS, former smoker,    Pulmonary exam normal        Cardiovascular hypertension, Normal cardiovascular exam  Of HCTZ a few weeks due to side effects of petechiae   Neuro/Psych PSYCHIATRIC DISORDERS Chronic pain syndrome on chronic opioidnegative neurological ROS     GI/Hepatic Neg liver ROS,   Endo/Other  negative endocrine ROS  Renal/GU      Musculoskeletal   Abdominal Normal abdominal exam  (+)   Peds  Hematology negative hematology ROS (+)   Anesthesia Other Findings DCIS  Past Medical History: No date: Anxiety No date: Bursitis No date: Chronic pain syndrome 09/2021: DCIS (ductal carcinoma in situ) of breast     Comment:  RT breast No date: Depression No date: GERD (gastroesophageal reflux disease)     Comment:  h/o with pregnancy No date: Hypertension No date: Panic attack  Past Surgical History: 09/27/2021: BREAST BIOPSY; Right     Comment:  UOQ mid lateral calcs, coil marker, DCIS 09/27/2021: BREAST BIOPSY; Right     Comment:  UOQ calcs anterior, X marker, DCIS 09/27/2021: BREAST BIOPSY; Left     Comment:  Distortion, ribbon marker, FIBROSIS AND USUAL DUCTAL               HYPERPLASIA No date: LAPAROSCOPIC HYSTERECTOMY     Comment:  supracervical hyst due to AUB; Dr. Davis Gourd No date: TOE SURGERY     Comment:  shaved bone in R) great toe No date: TONSILLECTOMY 10/20/2021: wire placement; Right     Comment:  bracking for lumpectomy  BMI    Body Mass Index: 29.80 kg/m      Reproductive/Obstetrics negative OB ROS                             Anesthesia Physical Anesthesia  Plan  ASA: 2  Anesthesia Plan: General LMA   Post-op Pain Management: Tylenol PO (pre-op)*   Induction: Intravenous  PONV Risk Score and Plan: Dexamethasone, Ondansetron and Midazolam  Airway Management Planned: LMA  Additional Equipment:   Intra-op Plan:   Post-operative Plan: Extubation in OR  Informed Consent: I have reviewed the patients History and Physical, chart, labs and discussed the procedure including the risks, benefits and alternatives for the proposed anesthesia with the patient or authorized representative who has indicated his/her understanding and acceptance.     Dental Advisory Given  Plan Discussed with: Anesthesiologist, CRNA and Surgeon  Anesthesia Plan Comments:         Anesthesia Quick Evaluation

## 2021-10-20 NOTE — Discharge Instructions (Signed)
AMBULATORY SURGERY  ?DISCHARGE INSTRUCTIONS ? ? ?The drugs that you were given will stay in your system until tomorrow so for the next 24 hours you should not: ? ?Drive an automobile ?Make any legal decisions ?Drink any alcoholic beverage ? ? ?You may resume regular meals tomorrow.  Today it is better to start with liquids and gradually work up to solid foods. ? ?You may eat anything you prefer, but it is better to start with liquids, then soup and crackers, and gradually work up to solid foods. ? ? ?Please notify your doctor immediately if you have any unusual bleeding, trouble breathing, redness and pain at the surgery site, drainage, fever, or pain not relieved by medication. ? ? ? ?Additional Instructions: ? ? ? ?Please contact your physician with any problems or Same Day Surgery at [phone], Monday through Friday 6 am to 4 pm, or [place] [facility] [place] Main number at [phone].  ?

## 2021-10-20 NOTE — Progress Notes (Signed)
Patient awake/alert x4.  Affect flat. Patient stats she takes pain medication:  percocet every 8hrs for chronic pain syndrome. Medicated per orders, reviewed procedure and plan of care, verbalizes understanding.  Tolerated crackers and apple juice.

## 2021-10-20 NOTE — Transfer of Care (Signed)
Immediate Anesthesia Transfer of Care Note  Patient: Kaitlyn Ali  Procedure(s) Performed: BREAST LUMPECTOMY WITH NEEDLE LOCALIZATION (Right)  Patient Location: PACU  Anesthesia Type:General  Level of Consciousness: drowsy  Airway & Oxygen Therapy: Patient Spontanous Breathing and Patient connected to face mask oxygen  Post-op Assessment: Report given to RN and Post -op Vital signs reviewed and stable  Post vital signs: Reviewed and stable  Last Vitals:  Vitals Value Taken Time  BP 131/74 10/20/21 1503  Temp    Pulse 74 10/20/21 1508  Resp 11 10/20/21 1507  SpO2 100 % 10/20/21 1508  Vitals shown include unvalidated device data.  Last Pain:  Vitals:   10/20/21 1128  TempSrc: Temporal  PainSc: 3          Complications: No notable events documented.

## 2021-10-21 ENCOUNTER — Encounter: Payer: Self-pay | Admitting: General Surgery

## 2021-10-21 NOTE — Anesthesia Postprocedure Evaluation (Signed)
Anesthesia Post Note  Patient: Kaitlyn Ali  Procedure(s) Performed: BREAST LUMPECTOMY WITH NEEDLE LOCALIZATION (Right)  Patient location during evaluation: PACU Anesthesia Type: General Level of consciousness: awake and alert Pain management: pain level controlled Vital Signs Assessment: post-procedure vital signs reviewed and stable Respiratory status: spontaneous breathing, nonlabored ventilation, respiratory function stable and patient connected to nasal cannula oxygen Cardiovascular status: blood pressure returned to baseline and stable Postop Assessment: no apparent nausea or vomiting Anesthetic complications: no   No notable events documented.   Last Vitals:  Vitals:   10/20/21 1545 10/20/21 1608  BP: 131/81 (!) 155/89  Pulse: 81 85  Resp: 13 16  Temp: (!) 36.4 C 36.4 C  SpO2: 96% 99%    Last Pain:  Vitals:   10/21/21 0820  TempSrc:   PainSc: 0-No pain                 Dimas Millin

## 2021-10-25 ENCOUNTER — Encounter: Payer: Self-pay | Admitting: *Deleted

## 2021-10-25 ENCOUNTER — Other Ambulatory Visit: Payer: Self-pay | Admitting: Anatomic Pathology & Clinical Pathology

## 2021-10-30 NOTE — Progress Notes (Unsigned)
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  INTERVAL HISTORY: Patient underwent lumpectomy on October 20, 2021 which revealed an invasive component on final pathology.  Both the lateral and anterior margin were involved with DCIS, but no invasive carcinoma.  Patient currently feels well.  She has no neurologic complaints.  She denies any recent fevers or illnesses.  She has a good appetite and denies weight loss.  She has no chest pain, shortness of breath, cough, or hemoptysis.  She denies any nausea, vomiting, constipation, diarrhea.  She has no urinary complaints.  Patient offers no further specific complaints today.  REVIEW OF SYSTEMS:   Review of Systems  Constitutional: Negative.  Negative for fever, malaise/fatigue and weight loss.  Respiratory: Negative.  Negative for cough, hemoptysis and shortness of breath.   Cardiovascular: Negative.  Negative for chest pain and leg swelling.  Gastrointestinal: Negative.  Negative for abdominal pain.  Genitourinary: Negative.  Negative for dysuria.  Musculoskeletal: Negative.  Negative for back pain.  Skin: Negative.  Negative for rash.  Neurological: Negative.  Negative for dizziness, focal weakness, weakness and headaches.  Psychiatric/Behavioral: Negative.  The patient is not nervous/anxious.     As per HPI. Otherwise, a complete review of systems is negative.  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   RT breast   Depression    GERD (gastroesophageal reflux disease)    h/o with pregnancy   Hypertension    Panic attack      PAST SURGICAL HISTORY: Past Surgical History:  Procedure Laterality Date   BREAST BIOPSY Right 09/27/2021   UOQ mid lateral calcs, coil marker, DCIS   BREAST BIOPSY Right 09/27/2021   UOQ calcs anterior, X marker, DCIS   BREAST BIOPSY Left 09/27/2021   Distortion, ribbon marker, FIBROSIS AND USUAL DUCTAL HYPERPLASIA   BREAST LUMPECTOMY WITH NEEDLE LOCALIZATION Right 10/20/2021   Procedure: BREAST LUMPECTOMY WITH NEEDLE LOCALIZATION;  Surgeon: Robert Bellow, MD;  Location: [facility] ORS;  Service: General;  Laterality: Right;   LAPAROSCOPIC HYSTERECTOMY     supracervical hyst due to AUB; Dr. Davis Gourd   TOE SURGERY     shaved bone in R) great toe   TONSILLECTOMY     wire placement Right 10/20/2021   bracking for lumpectomy    FAMILY HISTORY: Family History  Problem Relation Age of Onset   Diabetes Mother    Liver cancer Father    Breast cancer Sister 26       NEG BRCA testing    ADVANCED DIRECTIVES (Y/N):  N  HEALTH MAINTENANCE: Social History   Tobacco Use   Smoking status: Former    Packs/day: 0.25    Years: 15.00    Total pack years: 3.75    Types: Cigarettes    Quit date: 2013    Years since quitting: 10.5   Smokeless tobacco: Never   Tobacco comments:    Social smoker only  Vaping Use   Vaping Use: Never used  Substance Use Topics   Alcohol use: Not Currently   Drug use: No     Colonoscopy:  PAP:  Bone density:  Lipid panel:  Allergies  Allergen Reactions   Doxycycline Itching   Nsaids     Bothers stomach    Current Outpatient Medications  Medication Sig Dispense Refill   diazepam (VALIUM) 5 MG tablet Take 5 mg by mouth 2 (two) times daily as needed for anxiety. Dr Toy Care  1   diphenhydrAMINE (BENADRYL) 25 mg capsule Take 25 mg by mouth every morning.     oxyCODONE-acetaminophen (PERCOCET) 10-325 MG per tablet Take 1 tablet by mouth 3 (three) times daily. Heag pain management  0   hydrochlorothiazide (HYDRODIURIL) 12.5 MG tablet TAKE 1  TABLET(12.5 MG) BY MOUTH DAILY (Patient not taking: Reported on 10/14/2021) 90 tablet 0   oxyCODONE (ROXICODONE) 5 MG immediate release tablet Take 1 tablet (5 mg total) by mouth every 4 (four) hours as needed for severe pain. (Patient not taking: Reported on 11/04/2021) 20 tablet 0   triamcinolone cream (KENALOG) 0.1 % Apply 1 application. topically 2 (two) times daily. (Patient not taking: Reported on 11/04/2021) 30 g 0   No current facility-administered medications for this visit.    OBJECTIVE: Vitals:   11/04/21 1341  BP: (!) 135/93  Pulse: 90  Resp: 16  Temp: (!) 97 F (36.1 C)  SpO2: 100%     Body mass index is 29.38 kg/m.    ECOG FS:0 - Asymptomatic  General: Well-developed, well-nourished, no acute distress. Eyes: Pink conjunctiva, anicteric sclera. HEENT: Normocephalic, moist mucous membranes. Lungs: No audible wheezing or coughing. Heart: Regular rate and rhythm. Abdomen: Soft, nontender, no obvious distention. Musculoskeletal: No edema, cyanosis, or clubbing. Neuro: Alert, answering all questions appropriately. Cranial nerves grossly intact. Skin: No rashes or petechiae noted. Psych: Normal affect.  LAB RESULTS:  Lab Results  Component Value Date   NA 141 09/23/2021   K 4.3 09/23/2021   CL 99 09/23/2021   CO2 23 09/23/2021   GLUCOSE 111 (H) 09/23/2021   BUN 17 09/23/2021   CREATININE 1.10 (H) 09/23/2021   CALCIUM 10.6 (H) 09/23/2021   PROT 7.5 10/11/2017   ALBUMIN 4.9 (H) 09/23/2021   AST 17 10/11/2017   ALKPHOS 70 10/11/2017   BILITOT 0.4 10/11/2017   GFRNONAA 56 (L) 10/11/2017   GFRAA 65 10/11/2017    Lab Results  Component Value Date   WBC 9.3 09/23/2021   NEUTROABS 5.4 09/23/2021   HGB 15.4 09/23/2021   HCT 46.5 09/23/2021   MCV 91 09/23/2021   PLT 344 09/23/2021     STUDIES: MM Breast Surgical Specimen  Result Date: 10/21/2021 CLINICAL DATA:  Evaluate surgical specimen following lumpectomy for RIGHT breast DCIS. EXAM: SPECIMEN RADIOGRAPH OF  THE RIGHT BREAST COMPARISON:  Previous exam(s). FINDINGS: Status post excision of the RIGHT breast. The 3 wire tips, X biopsy clip and COIL biopsy clip are present within the specimen. IMPRESSION: Specimen radiograph of the RIGHT breast. Electronically Signed   By: Margarette Canada M.D.   On: 10/21/2021 12:59  MM RT PLC BREAST LOC DEV   1ST LESION  INC MAMMO GUIDE  Result Date: 10/20/2021 CLINICAL DATA:  66 year old female for wire bracketing of RIGHT breast DCIS calcifications prior to lumpectomy. EXAM: NEEDLE LOCALIZATION OF THE RIGHT BREAST WITH MAMMO GUIDANCE X 3 COMPARISON:  Previous exam(s). PROCEDURE: Patient presents for needle localization prior to RIGHT lumpectomy. I met with the patient and we discussed the procedure of needle localization including benefits and alternatives. We discussed the high likelihood of a successful procedure. We discussed the risks of the procedure,  including infection, bleeding, tissue injury, and further surgery. Informed, written consent was given. The usual time-out protocol was performed immediately prior to the procedure. Using mammographic guidance, sterile technique, 1% lidocaine and a 7 cm modified Kopans needle, the posterosuperior aspect of UPPER-OUTER RIGHT breast DCIS calcifications were localized using a LATERAL approach. DCIS calcifications lie just anterior to the mid-distal thick part of wire. Using mammographic guidance, sterile technique, 1% lidocaine and a 7 cm modified Kopans needle, the posteroinferior aspect of UPPER-OUTER RIGHT breast DCIS calcifications were localized using a LATERAL approach. DCIS calcifications lie just anterior to the distal thick portion and tip of the wire. Using mammographic guidance, sterile technique, 1% lidocaine and a 5 cm modified Kopans needle, the anterior aspect of UPPER-OUTER RIGHT breast DCIS calcifications and X biopsy clip were localized using a LATERAL approach. DCIS calcifications lies 0.8 cm anterior to the thick  portion of wire. The images were marked for Dr. Bary Castilla. IMPRESSION: Needle wire localization/bracketing of the UPPER-OUTER RIGHT breast DCIS calcifications. No apparent complications. Electronically Signed   By: Margarette Canada M.D.   On: 10/20/2021 11:19  MM RT PLC BREAST LOC DEV   EA ADD LESION  INC MAMMO GUIDE  Result Date: 10/20/2021 CLINICAL DATA:  66 year old female for wire bracketing of RIGHT breast DCIS calcifications prior to lumpectomy. EXAM: NEEDLE LOCALIZATION OF THE RIGHT BREAST WITH MAMMO GUIDANCE X 3 COMPARISON:  Previous exam(s). PROCEDURE: Patient presents for needle localization prior to RIGHT lumpectomy. I met with the patient and we discussed the procedure of needle localization including benefits and alternatives. We discussed the high likelihood of a successful procedure. We discussed the risks of the procedure, including infection, bleeding, tissue injury, and further surgery. Informed, written consent was given. The usual time-out protocol was performed immediately prior to the procedure. Using mammographic guidance, sterile technique, 1% lidocaine and a 7 cm modified Kopans needle, the posterosuperior aspect of UPPER-OUTER RIGHT breast DCIS calcifications were localized using a LATERAL approach. DCIS calcifications lie just anterior to the mid-distal thick part of wire. Using mammographic guidance, sterile technique, 1% lidocaine and a 7 cm modified Kopans needle, the posteroinferior aspect of UPPER-OUTER RIGHT breast DCIS calcifications were localized using a LATERAL approach. DCIS calcifications lie just anterior to the distal thick portion and tip of the wire. Using mammographic guidance, sterile technique, 1% lidocaine and a 5 cm modified Kopans needle, the anterior aspect of UPPER-OUTER RIGHT breast DCIS calcifications and X biopsy clip were localized using a LATERAL approach. DCIS calcifications lies 0.8 cm anterior to the thick portion of wire. The images were marked for Dr.  Bary Castilla. IMPRESSION: Needle wire localization/bracketing of the UPPER-OUTER RIGHT breast DCIS calcifications. No apparent complications. Electronically Signed   By: Margarette Canada M.D.   On: 10/20/2021 11:19  MM RT PLC BREAST LOC DEV   EA ADD LESION  INC MAMMO GUIDE  Result Date: 10/20/2021 CLINICAL DATA:  66 year old female for wire bracketing of RIGHT breast DCIS calcifications prior to lumpectomy. EXAM: NEEDLE LOCALIZATION OF THE RIGHT BREAST WITH MAMMO GUIDANCE X 3 COMPARISON:  Previous exam(s). PROCEDURE: Patient presents for needle localization prior to RIGHT lumpectomy. I met with the patient and we discussed the procedure of needle localization including benefits and alternatives. We discussed the high likelihood of a successful procedure. We discussed the risks of the procedure, including infection, bleeding, tissue injury, and further surgery. Informed, written consent was given. The usual time-out protocol was performed immediately prior to the procedure. Using mammographic guidance, sterile technique, 1%  lidocaine and a 7 cm modified Kopans needle, the posterosuperior aspect of UPPER-OUTER RIGHT breast DCIS calcifications were localized using a LATERAL approach. DCIS calcifications lie just anterior to the mid-distal thick part of wire. Using mammographic guidance, sterile technique, 1% lidocaine and a 7 cm modified Kopans needle, the posteroinferior aspect of UPPER-OUTER RIGHT breast DCIS calcifications were localized using a LATERAL approach. DCIS calcifications lie just anterior to the distal thick portion and tip of the wire. Using mammographic guidance, sterile technique, 1% lidocaine and a 5 cm modified Kopans needle, the anterior aspect of UPPER-OUTER RIGHT breast DCIS calcifications and X biopsy clip were localized using a LATERAL approach. DCIS calcifications lies 0.8 cm anterior to the thick portion of wire. The images were marked for Dr. Bary Castilla. IMPRESSION: Needle wire  localization/bracketing of the UPPER-OUTER RIGHT breast DCIS calcifications. No apparent complications. Electronically Signed   By: Margarette Canada M.D.   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  She was also seen by radiation oncology today who has recommended adjuvant XRT given the fact that she underwent lumpectomy.  ER/PR and HER2 are pending at time of dictation.  If ER/PR positive, will send Oncotype Dx score.  Unclear if aromatase inhibitor will be necessary.  Patient will return to clinic in 2 weeks to discuss her final pathology results and treatment planning.  I spent a total of 30 minutes reviewing chart data, face-to-face evaluation with the patient, counseling and coordination of care as detailed above.   Patient expressed understanding and was in agreement with this plan. She also understands that She can call clinic at any time with any questions, concerns, or complaints.    Cancer Staging  Ductal carcinoma in situ (DCIS) of right breast Staging form: Breast, AJCC 8th Edition - Clinical stage from 10/15/2021: Stage 0 (cTis (DCIS), cN0, cM0, ER+, PR: Not Assessed, HER2: Not Assessed) - Signed by Lloyd Huger, MD on 10/15/2021 Stage prefix: Initial diagnosis Nuclear grade: G3   Lloyd Huger, MD   11/04/2021 4:10 PM

## 2021-11-04 ENCOUNTER — Ambulatory Visit
Admission: RE | Admit: 2021-11-04 | Discharge: 2021-11-04 | Disposition: A | Discharge status: Home / Self Care | Payer: Medicare HMO | Source: Ambulatory Visit | Attending: Radiation Oncology | Admitting: Radiation Oncology

## 2021-11-04 ENCOUNTER — Encounter: Payer: Self-pay | Admitting: Oncology

## 2021-11-04 ENCOUNTER — Ambulatory Visit: Payer: Medicare HMO | Admitting: Oncology

## 2021-11-04 ENCOUNTER — Encounter: Payer: Self-pay | Admitting: Radiation Oncology

## 2021-11-04 ENCOUNTER — Inpatient Hospital Stay: Payer: Medicare HMO | Attending: Oncology | Admitting: Oncology

## 2021-11-04 VITALS — BP 135/93 | HR 85 | Temp 97.0°F | Resp 12 | Wt 193.2 lb

## 2021-11-04 VITALS — BP 135/93 | HR 90 | Temp 97.0°F | Resp 16 | Ht 68.0 in | Wt 193.2 lb

## 2021-11-04 DIAGNOSIS — D0511 Intraductal carcinoma in situ of right breast: Secondary | ICD-10-CM | POA: Insufficient documentation

## 2021-11-04 DIAGNOSIS — Z170 Estrogen receptor positive status [ER+]: Secondary | ICD-10-CM | POA: Diagnosis not present

## 2021-11-04 DIAGNOSIS — F17210 Nicotine dependence, cigarettes, uncomplicated: Secondary | ICD-10-CM | POA: Diagnosis not present

## 2021-11-04 NOTE — Consult Note (Signed)
NEW PATIENT EVALUATION  Name: Kaitlyn Ali  MRN: [id number]  Date:   11/04/2021     DOB: 10-Dec-1955   This 66 y.o. female patient presents to the clinic for initial evaluation of probable stage Ia grade 3 invasive mammary carcinoma of the right breast status post wide local excision.  REFERRING PHYSICIAN: Juline Patch, MD  CHIEF COMPLAINT:  Chief Complaint  Patient presents with   Breast Cancer    DIAGNOSIS: The encounter diagnosis was Ductal carcinoma in situ (DCIS) of right breast.   PREVIOUS INVESTIGATIONS:  Pathology reports reviewed Mammogram and ultrasound reviewed Clinical notes reviewed  HPI: Patient is a 66 year old female who presented on routine mammogram showing suspicious calcifications in the upper outer quadrant of the right breast spanning 6 cm.  There was suspicious distortion at 12:00 region of the left breast.  Stereotactic biopsy was positive for ductal carcinoma in situ.  She went on to have a wide local excision showing a 7 mm area of invasive mammary carcinoma grade 3.  There are multiple foci of invasive carcinoma present.  There is also high-grade DCIS with comedonecrosis.  Margins were clear at 1 mm for the invasive component margin for DCIS was involved laterally anterior multifocal.  No lymph nodes were removed.  ER/PR status and HER2/neu status has not been reported.  She has been seen by both medical oncology and radiation oncology today.  PLANNED TREATMENT REGIMEN: Possible whole breast and peripheral lymphatic radiation versus possible further surgery  PAST MEDICAL HISTORY:  has a past medical history of Anxiety, Bursitis, Chronic pain syndrome, DCIS (ductal carcinoma in situ) of breast (09/2021), Depression, GERD (gastroesophageal reflux disease), Hypertension, and Panic attack.    PAST SURGICAL HISTORY:  Past Surgical History:  Procedure Laterality Date   BREAST BIOPSY Right 09/27/2021   UOQ mid lateral calcs, coil marker, DCIS   BREAST  BIOPSY Right 09/27/2021   UOQ calcs anterior, X marker, DCIS   BREAST BIOPSY Left 09/27/2021   Distortion, ribbon marker, FIBROSIS AND USUAL DUCTAL HYPERPLASIA   BREAST LUMPECTOMY WITH NEEDLE LOCALIZATION Right 10/20/2021   Procedure: BREAST LUMPECTOMY WITH NEEDLE LOCALIZATION;  Surgeon: Robert Bellow, MD;  Location: [facility] ORS;  Service: General;  Laterality: Right;   LAPAROSCOPIC HYSTERECTOMY     supracervical hyst due to AUB; Dr. Davis Gourd   TOE SURGERY     shaved bone in R) great toe   TONSILLECTOMY     wire placement Right 10/20/2021   bracking for lumpectomy    FAMILY HISTORY: family history includes Breast cancer (age of onset: 67) in her sister; Diabetes in her mother; Liver cancer in her father.  SOCIAL HISTORY:  reports that she quit smoking about 10 years ago. Her smoking use included cigarettes. She has a 3.75 pack-year smoking history. She has never used smokeless tobacco. She reports that she does not currently use alcohol. She reports that she does not use drugs.  ALLERGIES: Doxycycline and Nsaids  MEDICATIONS:  Current Outpatient Medications  Medication Sig Dispense Refill   diazepam (VALIUM) 5 MG tablet Take 5 mg by mouth 2 (two) times daily as needed for anxiety. Dr Toy Care  1   diphenhydrAMINE (BENADRYL) 25 mg capsule Take 25 mg by mouth every morning.     oxyCODONE-acetaminophen (PERCOCET) 10-325 MG per tablet Take 1 tablet by mouth 3 (three) times daily. Heag pain management  0   triamcinolone cream (KENALOG) 0.1 % Apply 1 application. topically 2 (two) times daily. (Patient not taking: Reported on 11/04/2021)  30 g 0   hydrochlorothiazide (HYDRODIURIL) 12.5 MG tablet TAKE 1 TABLET(12.5 MG) BY MOUTH DAILY (Patient not taking: Reported on 10/14/2021) 90 tablet 0   oxyCODONE (ROXICODONE) 5 MG immediate release tablet Take 1 tablet (5 mg total) by mouth every 4 (four) hours as needed for severe pain. (Patient not taking: Reported on 11/04/2021) 20 tablet 0   No current  facility-administered medications for this encounter.    ECOG PERFORMANCE STATUS:  0 - Asymptomatic  REVIEW OF SYSTEMS: Patient denies any weight loss, fatigue, weakness, fever, chills or night sweats. Patient denies any loss of vision, blurred vision. Patient denies any ringing  of the ears or hearing loss. No irregular heartbeat. Patient denies heart murmur or history of fainting. Patient denies any chest pain or pain radiating to her upper extremities. Patient denies any shortness of breath, difficulty breathing at night, cough or hemoptysis. Patient denies any swelling in the lower legs. Patient denies any nausea vomiting, vomiting of blood, or coffee ground material in the vomitus. Patient denies any stomach pain. Patient states has had normal bowel movements no significant constipation or diarrhea. Patient denies any dysuria, hematuria or significant nocturia. Patient denies any problems walking, swelling in the joints or loss of balance. Patient denies any skin changes, loss of hair or loss of weight. Patient denies any excessive worrying or anxiety or significant depression. Patient denies any problems with insomnia. Patient denies excessive thirst, polyuria, polydipsia. Patient denies any swollen glands, patient denies easy bruising or easy bleeding. Patient denies any recent infections, allergies or URI. Patient "s visual fields have not changed significantly in recent time.   PHYSICAL EXAM: BP (!) 135/93 (BP Location: Left Arm, Patient Position: Sitting, Cuff Size: Normal)   Pulse 85   Temp (!) 97 F (36.1 C) (Tympanic)   Resp 12   Wt 193 lb 3.2 oz (87.6 kg)   BMI 29.38 kg/m  She status post wide local excision of the right breast which is healing well.  No dominant masses noted in either breast.  No axillary or supraclavicular adenopathy is identified.  Well-developed well-nourished patient in NAD. HEENT reveals PERLA, EOMI, discs not visualized.  Oral cavity is clear. No oral mucosal  lesions are identified. Neck is clear without evidence of cervical or supraclavicular adenopathy. Lungs are clear to A&P. Cardiac examination is essentially unremarkable with regular rate and rhythm without murmur rub or thrill. Abdomen is benign with no organomegaly or masses noted. Motor sensory and DTR levels are equal and symmetric in the upper and lower extremities. Cranial nerves II through XII are grossly intact. Proprioception is intact. No peripheral adenopathy or edema is identified. No motor or sensory levels are noted. Crude visual fields are within normal range. LABORATORY DATA: Pathology reports reviewed    RADIOLOGY RESULTS: Mammogram and ultrasound reviewed   IMPRESSION: Stage Ia invasive mammary carcinoma of the right breast.  PLAN: At this time we need ER/PR status as well as HER2/neu status.  Patient is also receiving Oncotype DX.  Based on the DCIS positive margins can forego further surgery and high-dose that area with both whole breast radiation as well as boost electron therapy.  Another alternative would be further surgery for the DCIS plus sentinel node biopsy.  We will review those options with Dr. Tollie Pizza.  He is in the [facility] nomogram she has approximately 19% chance of sentinel node involvement although with radiation therapy to both peripheral lymphatics and breast may alleviate the need for further surgery.  We  will to cut discussed the case further with surgery and medical oncology.  Patient comprehends my recommendations at this time.  I would like to take this opportunity to thank you for allowing me to participate in the care of your patient.Noreene Filbert, MD

## 2021-11-05 DIAGNOSIS — Z79891 Long term (current) use of opiate analgesic: Secondary | ICD-10-CM | POA: Diagnosis not present

## 2021-11-05 DIAGNOSIS — M25551 Pain in right hip: Secondary | ICD-10-CM | POA: Diagnosis not present

## 2021-11-05 DIAGNOSIS — M25511 Pain in right shoulder: Secondary | ICD-10-CM | POA: Diagnosis not present

## 2021-11-05 DIAGNOSIS — G894 Chronic pain syndrome: Secondary | ICD-10-CM | POA: Diagnosis not present

## 2021-11-05 DIAGNOSIS — M25562 Pain in left knee: Secondary | ICD-10-CM | POA: Diagnosis not present

## 2021-11-05 DIAGNOSIS — M25552 Pain in left hip: Secondary | ICD-10-CM | POA: Diagnosis not present

## 2021-11-05 DIAGNOSIS — M25571 Pain in right ankle and joints of right foot: Secondary | ICD-10-CM | POA: Diagnosis not present

## 2021-11-05 LAB — SURGICAL PATHOLOGY

## 2021-11-08 ENCOUNTER — Encounter: Payer: Self-pay | Admitting: *Deleted

## 2021-11-08 NOTE — Progress Notes (Signed)
Notified patient that Dr. Grayland Ormond would like to see her sooner to discuss her final pathology results.   Pt is scheduled for mychart video visit on 8/10, she is aware of the appt. Details.

## 2021-11-11 ENCOUNTER — Inpatient Hospital Stay: Payer: Medicare HMO | Admitting: Oncology

## 2021-11-11 ENCOUNTER — Inpatient Hospital Stay (HOSPITAL_BASED_OUTPATIENT_CLINIC_OR_DEPARTMENT_OTHER): Payer: Medicare HMO | Admitting: Oncology

## 2021-11-11 ENCOUNTER — Encounter: Payer: Self-pay | Admitting: Oncology

## 2021-11-11 DIAGNOSIS — B078 Other viral warts: Secondary | ICD-10-CM | POA: Diagnosis not present

## 2021-11-11 DIAGNOSIS — C50911 Malignant neoplasm of unspecified site of right female breast: Secondary | ICD-10-CM | POA: Diagnosis not present

## 2021-11-11 DIAGNOSIS — L821 Other seborrheic keratosis: Secondary | ICD-10-CM | POA: Diagnosis not present

## 2021-11-11 NOTE — Progress Notes (Signed)
[place]  [phone] [phone]) [phone]  ID: Kaitlyn Ali OB: Aug 17, 1962  MR#: [id number]  ZDG#:[id number]  Patient Care Team: Juline Patch, MD as PCP - General (Family Medicine) Daiva Huge, RN as Oncology Nurse Navigator  I connected with Selina Riggs Ali on 11/11/21 at 10:45 AM EDT by video enabled telemedicine visit and verified that I am speaking with the correct person using two identifiers.   I discussed the limitations, risks, security and privacy concerns of performing an evaluation and management service by telemedicine and the availability of in-person appointments. I also discussed with the patient that there may be a patient responsible charge related to this service. The patient expressed understanding and agreed to proceed.   Other persons participating in the visit and their role in the encounter: Patient, MD.  Patient's location: Home. Provider's location: Clinic.  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  INTERVAL HISTORY: Patient agreed to video assisted telemedicine visit for further evaluation, discussion of her final pathology results, and treatment planning.  Previously, both the lateral and anterior margin were involved with DCIS, but no invasive carcinoma.  Patient is anxious, but otherwise feels well.  She has no neurologic complaints.  She denies any recent fevers or illnesses.  She has a good appetite and denies weight loss.  She has no chest pain, shortness of breath, cough, or hemoptysis.  She denies any nausea, vomiting, constipation, diarrhea.  She has no urinary complaints.  Patient offers no further specific complaints today.  REVIEW OF SYSTEMS:   Review of Systems  Constitutional: Negative.  Negative for fever, malaise/fatigue and weight loss.  Respiratory: Negative.  Negative for cough, hemoptysis and shortness of breath.   Cardiovascular: Negative.  Negative for  chest pain and leg swelling.  Gastrointestinal: Negative.  Negative for abdominal pain.  Genitourinary: Negative.  Negative for dysuria.  Musculoskeletal: Negative.  Negative for back pain.  Skin: Negative.  Negative for rash.  Neurological: Negative.  Negative for dizziness, focal weakness, weakness and headaches.  Psychiatric/Behavioral:  The patient is nervous/anxious.     As per HPI. Otherwise, a complete review of systems is negative.  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   RT breast   Depression    GERD (gastroesophageal reflux disease)    h/o with pregnancy   Hypertension    Panic attack     PAST SURGICAL HISTORY: Past Surgical History:  Procedure Laterality Date   BREAST BIOPSY Right 09/27/2021   UOQ mid lateral calcs, coil marker, DCIS   BREAST BIOPSY Right 09/27/2021   UOQ calcs anterior, X marker, DCIS   BREAST BIOPSY Left 09/27/2021   Distortion, ribbon marker, FIBROSIS AND USUAL DUCTAL HYPERPLASIA   BREAST LUMPECTOMY WITH NEEDLE LOCALIZATION Right 10/20/2021   Procedure: BREAST LUMPECTOMY WITH NEEDLE LOCALIZATION;  Surgeon: Robert Bellow, MD;  Location: [facility] ORS;  Service: General;  Laterality: Right;   LAPAROSCOPIC HYSTERECTOMY     supracervical hyst due to AUB; Dr. Davis Gourd   TOE SURGERY     shaved bone in R) great toe   TONSILLECTOMY     wire placement Right 10/20/2021   bracking for lumpectomy    FAMILY HISTORY: Family History  Problem Relation Age of Onset   Diabetes Mother    Liver cancer Father  Breast cancer Sister 80       NEG BRCA testing    ADVANCED DIRECTIVES (Y/N):  N  HEALTH MAINTENANCE: Social History   Tobacco Use   Smoking status: Former    Packs/day: 0.25    Years: 15.00    Total pack years: 3.75    Types: Cigarettes    Quit date: 2013    Years since quitting: 10.6   Smokeless tobacco:  Never   Tobacco comments:    Social smoker only  Media planner   Vaping Use: Never used  Substance Use Topics   Alcohol use: Not Currently   Drug use: No     Colonoscopy:  PAP:  Bone density:  Lipid panel:  Allergies  Allergen Reactions   Doxycycline Itching   Nsaids     Bothers stomach    Current Outpatient Medications  Medication Sig Dispense Refill   diazepam (VALIUM) 5 MG tablet Take 5 mg by mouth 2 (two) times daily as needed for anxiety. Dr Toy Care  1   diphenhydrAMINE (BENADRYL) 25 mg capsule Take 25 mg by mouth every morning.     oxyCODONE-acetaminophen (PERCOCET) 10-325 MG per tablet Take 1 tablet by mouth 3 (three) times daily. [facility] pain management  0   triamcinolone cream (KENALOG) 0.1 % Apply 1 application. topically 2 (two) times daily. 30 g 0   hydrochlorothiazide (HYDRODIURIL) 12.5 MG tablet TAKE 1 TABLET(12.5 MG) BY MOUTH DAILY (Patient not taking: Reported on 10/14/2021) 90 tablet 0   oxyCODONE (ROXICODONE) 5 MG immediate release tablet Take 1 tablet (5 mg total) by mouth every 4 (four) hours as needed for severe pain. (Patient not taking: Reported on 11/04/2021) 20 tablet 0   No current facility-administered medications for this visit.    OBJECTIVE: There were no vitals filed for this visit.    There is no height or weight on file to calculate BMI.    ECOG FS:0 - Asymptomatic  General: Well-developed, well-nourished, no acute distress. HEENT: Normocephalic. Neuro: Alert, answering all questions appropriately. Cranial nerves grossly intact. Psych: Normal affect.  LAB RESULTS:  Lab Results  Component Value Date   NA 141 09/23/2021   K 4.3 09/23/2021   CL 99 09/23/2021   CO2 23 09/23/2021   GLUCOSE 111 (H) 09/23/2021   BUN 17 09/23/2021   CREATININE 1.10 (H) 09/23/2021   CALCIUM 10.6 (H) 09/23/2021   PROT 7.5 10/11/2017   ALBUMIN 4.9 (H) 09/23/2021   AST 17 10/11/2017   ALKPHOS 70 10/11/2017   BILITOT 0.4 10/11/2017   GFRNONAA 56 (L) 10/11/2017    GFRAA 65 10/11/2017    Lab Results  Component Value Date   WBC 9.3 09/23/2021   NEUTROABS 5.4 09/23/2021   HGB 15.4 09/23/2021   HCT 46.5 09/23/2021   MCV 91 09/23/2021   PLT 344 09/23/2021     STUDIES: MM Breast Surgical Specimen  Result Date: 10/21/2021 CLINICAL DATA:  Evaluate surgical specimen following lumpectomy for RIGHT breast DCIS. EXAM: SPECIMEN RADIOGRAPH OF THE RIGHT BREAST COMPARISON:  Previous exam(s). FINDINGS: Status post excision of the RIGHT breast. The 3 wire tips, X biopsy clip and COIL biopsy clip are present within the specimen. IMPRESSION: Specimen radiograph of the RIGHT breast. Electronically Signed   By: Margarette Canada M.D.   On: 10/21/2021 12:59  MM RT PLC BREAST LOC DEV   1ST LESION  INC MAMMO GUIDE  Result Date: 10/20/2021 CLINICAL DATA:  66 year old female for wire bracketing of RIGHT breast DCIS calcifications prior to lumpectomy.  EXAM: NEEDLE LOCALIZATION OF THE RIGHT BREAST WITH MAMMO GUIDANCE X 3 COMPARISON:  Previous exam(s). PROCEDURE: Patient presents for needle localization prior to RIGHT lumpectomy. I met with the patient and we discussed the procedure of needle localization including benefits and alternatives. We discussed the high likelihood of a successful procedure. We discussed the risks of the procedure, including infection, bleeding, tissue injury, and further surgery. Informed, written consent was given. The usual time-out protocol was performed immediately prior to the procedure. Using mammographic guidance, sterile technique, 1% lidocaine and a 7 cm modified Kopans needle, the posterosuperior aspect of UPPER-OUTER RIGHT breast DCIS calcifications were localized using a LATERAL approach. DCIS calcifications lie just anterior to the mid-distal thick part of wire. Using mammographic guidance, sterile technique, 1% lidocaine and a 7 cm modified Kopans needle, the posteroinferior aspect of UPPER-OUTER RIGHT breast DCIS calcifications were localized using  a LATERAL approach. DCIS calcifications lie just anterior to the distal thick portion and tip of the wire. Using mammographic guidance, sterile technique, 1% lidocaine and a 5 cm modified Kopans needle, the anterior aspect of UPPER-OUTER RIGHT breast DCIS calcifications and X biopsy clip were localized using a LATERAL approach. DCIS calcifications lies 0.8 cm anterior to the thick portion of wire. The images were marked for Dr. Bary Castilla. IMPRESSION: Needle wire localization/bracketing of the UPPER-OUTER RIGHT breast DCIS calcifications. No apparent complications. Electronically Signed   By: Margarette Canada M.D.   On: 10/20/2021 11:19  MM RT PLC BREAST LOC DEV   EA ADD LESION  INC MAMMO GUIDE  Result Date: 10/20/2021 CLINICAL DATA:  66 year old female for wire bracketing of RIGHT breast DCIS calcifications prior to lumpectomy. EXAM: NEEDLE LOCALIZATION OF THE RIGHT BREAST WITH MAMMO GUIDANCE X 3 COMPARISON:  Previous exam(s). PROCEDURE: Patient presents for needle localization prior to RIGHT lumpectomy. I met with the patient and we discussed the procedure of needle localization including benefits and alternatives. We discussed the high likelihood of a successful procedure. We discussed the risks of the procedure, including infection, bleeding, tissue injury, and further surgery. Informed, written consent was given. The usual time-out protocol was performed immediately prior to the procedure. Using mammographic guidance, sterile technique, 1% lidocaine and a 7 cm modified Kopans needle, the posterosuperior aspect of UPPER-OUTER RIGHT breast DCIS calcifications were localized using a LATERAL approach. DCIS calcifications lie just anterior to the mid-distal thick part of wire. Using mammographic guidance, sterile technique, 1% lidocaine and a 7 cm modified Kopans needle, the posteroinferior aspect of UPPER-OUTER RIGHT breast DCIS calcifications were localized using a LATERAL approach. DCIS calcifications lie just  anterior to the distal thick portion and tip of the wire. Using mammographic guidance, sterile technique, 1% lidocaine and a 5 cm modified Kopans needle, the anterior aspect of UPPER-OUTER RIGHT breast DCIS calcifications and X biopsy clip were localized using a LATERAL approach. DCIS calcifications lies 0.8 cm anterior to the thick portion of wire. The images were marked for Dr. Bary Castilla. IMPRESSION: Needle wire localization/bracketing of the UPPER-OUTER RIGHT breast DCIS calcifications. No apparent complications. Electronically Signed   By: Margarette Canada M.D.   On: 10/20/2021 11:19  MM RT PLC BREAST LOC DEV   EA ADD LESION  INC MAMMO GUIDE  Result Date: 10/20/2021 CLINICAL DATA:  66 year old female for wire bracketing of RIGHT breast DCIS calcifications prior to lumpectomy. EXAM: NEEDLE LOCALIZATION OF THE RIGHT BREAST WITH MAMMO GUIDANCE X 3 COMPARISON:  Previous exam(s). PROCEDURE: Patient presents for needle localization prior to RIGHT lumpectomy. I met with the  patient and we discussed the procedure of needle localization including benefits and alternatives. We discussed the high likelihood of a successful procedure. We discussed the risks of the procedure, including infection, bleeding, tissue injury, and further surgery. Informed, written consent was given. The usual time-out protocol was performed immediately prior to the procedure. Using mammographic guidance, sterile technique, 1% lidocaine and a 7 cm modified Kopans needle, the posterosuperior aspect of UPPER-OUTER RIGHT breast DCIS calcifications were localized using a LATERAL approach. DCIS calcifications lie just anterior to the mid-distal thick part of wire. Using mammographic guidance, sterile technique, 1% lidocaine and a 7 cm modified Kopans needle, the posteroinferior aspect of UPPER-OUTER RIGHT breast DCIS calcifications were localized using a LATERAL approach. DCIS calcifications lie just anterior to the distal thick portion and tip of the  wire. Using mammographic guidance, sterile technique, 1% lidocaine and a 5 cm modified Kopans needle, the anterior aspect of UPPER-OUTER RIGHT breast DCIS calcifications and X biopsy clip were localized using a LATERAL approach. DCIS calcifications lies 0.8 cm anterior to the thick portion of wire. The images were marked for Dr. Bary Castilla. IMPRESSION: Needle wire localization/bracketing of the UPPER-OUTER RIGHT breast DCIS calcifications. No apparent complications. Electronically Signed   By: Margarette Canada M.D.   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  PLAN:    Stage Ia invasive triple positive carcinoma of the right breast: Given positive margins and the high-grade nature of her DCIS, agree with surgery and recommend reexcision.  Patient has an appointment next Tuesday with surgery to further discuss.  Patient's final pathology also returned HER2 positive, therefore have recommended adjuvant chemotherapy using weekly Taxol and Herceptin x 12 followed by maintenance Herceptin every 3 weeks for 1 year.  If patient still requires XRT after reexcision, this would be delayed until she completes her chemotherapy.  Finally, ER/PR positive and patient will benefit from an aromatase inhibitor for 5 years at the completion of her treatments.  Return to clinic in 1 week for further evaluation and additional treatment planning.    I provided 30 minutes of face-to-face video visit time during this encounter which included chart review, counseling, and coordination of care as documented above.  Patient expressed understanding and was in agreement with this plan. She also understands that She can call clinic at any time with any questions, concerns, or complaints.    Cancer Staging  Invasive ductal carcinoma of right breast [facility]) Staging form: Breast, AJCC 8th Edition - Pathologic stage from 11/11/2021: Stage IA (pT1b, pN0, cM0, G3, ER+, PR+, HER2+) - Signed  by Lloyd Huger, MD on 11/11/2021 Stage prefix: Initial diagnosis Histologic grading system: 3 grade system   Lloyd Huger, MD   11/11/2021 11:46 AM

## 2021-11-11 NOTE — Progress Notes (Signed)
No concerns today 

## 2021-11-13 NOTE — Progress Notes (Unsigned)
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  INTERVAL HISTORY: Patient returns to clinic today for further evaluation and further discussion of treatment recommendations.  She continues to be highly anxious, but otherwise feels well.  She has no neurologic complaints.  She denies any recent fevers or illnesses.  She has a good appetite and denies weight loss.  She has no chest pain, shortness of breath, cough, or hemoptysis.  She denies any nausea, vomiting, constipation, diarrhea.  She has no urinary complaints.  Patient offers no further specific complaints today.  REVIEW OF SYSTEMS:   Review of Systems  Constitutional: Negative.  Negative for fever, malaise/fatigue and weight loss.  Respiratory: Negative.  Negative for cough, hemoptysis and shortness of breath.   Cardiovascular: Negative.  Negative for chest pain and leg swelling.  Gastrointestinal: Negative.  Negative for abdominal pain.  Genitourinary: Negative.  Negative for dysuria.  Musculoskeletal: Negative.  Negative for back pain.  Skin: Negative.  Negative for rash.  Neurological: Negative.  Negative for dizziness, focal weakness, weakness and headaches.  Psychiatric/Behavioral:  The patient is nervous/anxious.     As per HPI. Otherwise, a complete review of systems is negative.  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   RT breast   Depression    GERD (gastroesophageal reflux disease)    h/o with pregnancy   Hypertension    Panic attack     PAST SURGICAL HISTORY: Past Surgical History:   Procedure Laterality Date   BREAST BIOPSY Right 09/27/2021   UOQ mid lateral calcs, coil marker, DCIS   BREAST BIOPSY Right 09/27/2021   UOQ calcs anterior, X marker, DCIS   BREAST BIOPSY Left 09/27/2021   Distortion, ribbon marker, FIBROSIS AND USUAL DUCTAL HYPERPLASIA   BREAST LUMPECTOMY WITH NEEDLE LOCALIZATION Right 10/20/2021   Procedure: BREAST LUMPECTOMY WITH NEEDLE LOCALIZATION;  Surgeon: Robert Bellow, MD;  Location: [facility] ORS;  Service: General;  Laterality: Right;   LAPAROSCOPIC HYSTERECTOMY     supracervical hyst due to AUB; Dr. Davis Gourd   TOE SURGERY     shaved bone in R) great toe   TONSILLECTOMY     wire placement Right 10/20/2021   bracking for lumpectomy    FAMILY HISTORY: Family History  Problem Relation Age of Onset   Diabetes Mother    Liver cancer Father    Breast cancer Sister 27       NEG BRCA testing    ADVANCED DIRECTIVES (Y/N):  N  HEALTH MAINTENANCE: Social History   Tobacco Use   Smoking status: Former    Packs/day: 0.25    Years: 15.00    Total pack years: 3.75    Types: Cigarettes    Quit date: 2013    Years since quitting: 10.6   Smokeless tobacco: Never   Tobacco comments:    Social smoker only  Vaping Use   Vaping Use: Never used  Substance Use Topics   Alcohol use: Not Currently   Drug use: No     Colonoscopy:  PAP:  Bone density:  Lipid panel:  Allergies  Allergen Reactions   Doxycycline Itching   Nsaids     Bothers stomach    Current Outpatient Medications  Medication Sig Dispense Refill   diazepam (VALIUM) 5 MG tablet Take 5 mg by mouth 2 (two) times daily as needed for anxiety. Dr Toy Care  1   diphenhydrAMINE (BENADRYL) 25 mg capsule Take 25 mg by mouth every morning.     oxyCODONE-acetaminophen (PERCOCET) 10-325 MG per tablet Take 1 tablet by mouth 3 (three) times daily. [facility] pain management  0   hydrochlorothiazide (HYDRODIURIL) 12.5 MG tablet TAKE 1 TABLET(12.5 MG) BY MOUTH DAILY (Patient not taking:  Reported on 10/14/2021) 90 tablet 0   oxyCODONE (ROXICODONE) 5 MG immediate release tablet Take 1 tablet (5 mg total) by mouth every 4 (four) hours as needed for severe pain. (Patient not taking: Reported on 11/04/2021) 20 tablet 0   triamcinolone cream (KENALOG) 0.1 % Apply 1 application. topically 2 (two) times daily. (Patient not taking: Reported on 11/18/2021) 30 g 0   No current facility-administered medications for this visit.    OBJECTIVE: Vitals:   11/18/21 1027  BP: (!) 138/95  Pulse: (!) 113  Temp: 98.7 F (37.1 C)      Body mass index is 29.5 kg/m.    ECOG FS:0 - Asymptomatic  General: Well-developed, well-nourished, no acute distress. Eyes: Pink conjunctiva, anicteric sclera. HEENT: Normocephalic, moist mucous membranes. Lungs: No audible wheezing or coughing. Heart: Regular rate and rhythm. Abdomen: Soft, nontender, no obvious distention. Musculoskeletal: No edema, cyanosis, or clubbing. Neuro: Alert, answering all questions appropriately. Cranial nerves grossly intact. Skin: No rashes or petechiae noted. Psych: Normal affect.  LAB RESULTS:  Lab Results  Component Value Date   NA 141 09/23/2021   K 4.3 09/23/2021   CL 99 09/23/2021   CO2 23 09/23/2021   GLUCOSE 111 (H) 09/23/2021   BUN 17 09/23/2021   CREATININE 1.10 (H) 09/23/2021   CALCIUM 10.6 (H) 09/23/2021   PROT 7.5 10/11/2017   ALBUMIN 4.9 (H) 09/23/2021   AST 17 10/11/2017   ALKPHOS 70 10/11/2017   BILITOT 0.4 10/11/2017   GFRNONAA 56 (L) 10/11/2017   GFRAA 65 10/11/2017    Lab Results  Component Value Date   WBC 9.3 09/23/2021   NEUTROABS 5.4 09/23/2021   HGB 15.4 09/23/2021   HCT 46.5 09/23/2021   MCV 91 09/23/2021   PLT 344 09/23/2021     STUDIES: MM Breast Surgical Specimen  Result Date: 10/21/2021 CLINICAL DATA:  Evaluate surgical specimen following lumpectomy for RIGHT breast DCIS. EXAM: SPECIMEN RADIOGRAPH OF THE RIGHT BREAST COMPARISON:  Previous exam(s). FINDINGS: Status post  excision of the RIGHT breast. The 3 wire tips, X biopsy clip and COIL biopsy clip are present within the specimen. IMPRESSION: Specimen radiograph of the RIGHT breast. Electronically Signed   By: Margarette Canada M.D.   On: 10/21/2021 12:59  MM RT PLC BREAST LOC DEV   1ST LESION  INC MAMMO GUIDE  Result Date: 10/20/2021 CLINICAL DATA:  66 year old female for wire bracketing of RIGHT breast DCIS calcifications prior to lumpectomy. EXAM: NEEDLE LOCALIZATION OF THE RIGHT BREAST WITH MAMMO GUIDANCE X 3 COMPARISON:  Previous exam(s). PROCEDURE: Patient presents for needle localization prior to RIGHT lumpectomy. I met with the patient and we discussed the procedure of needle localization including benefits and alternatives. We discussed the high likelihood of a successful procedure. We discussed the risks of the procedure, including infection, bleeding, tissue injury, and further surgery. Informed, written consent was given.  The usual time-out protocol was performed immediately prior to the procedure. Using mammographic guidance, sterile technique, 1% lidocaine and a 7 cm modified Kopans needle, the posterosuperior aspect of UPPER-OUTER RIGHT breast DCIS calcifications were localized using a LATERAL approach. DCIS calcifications lie just anterior to the mid-distal thick part of wire. Using mammographic guidance, sterile technique, 1% lidocaine and a 7 cm modified Kopans needle, the posteroinferior aspect of UPPER-OUTER RIGHT breast DCIS calcifications were localized using a LATERAL approach. DCIS calcifications lie just anterior to the distal thick portion and tip of the wire. Using mammographic guidance, sterile technique, 1% lidocaine and a 5 cm modified Kopans needle, the anterior aspect of UPPER-OUTER RIGHT breast DCIS calcifications and X biopsy clip were localized using a LATERAL approach. DCIS calcifications lies 0.8 cm anterior to the thick portion of wire. The images were marked for Dr. Bary Castilla. IMPRESSION: Needle  wire localization/bracketing of the UPPER-OUTER RIGHT breast DCIS calcifications. No apparent complications. Electronically Signed   By: Margarette Canada M.D.   On: 10/20/2021 11:19  MM RT PLC BREAST LOC DEV   EA ADD LESION  INC MAMMO GUIDE  Result Date: 10/20/2021 CLINICAL DATA:  66 year old female for wire bracketing of RIGHT breast DCIS calcifications prior to lumpectomy. EXAM: NEEDLE LOCALIZATION OF THE RIGHT BREAST WITH MAMMO GUIDANCE X 3 COMPARISON:  Previous exam(s). PROCEDURE: Patient presents for needle localization prior to RIGHT lumpectomy. I met with the patient and we discussed the procedure of needle localization including benefits and alternatives. We discussed the high likelihood of a successful procedure. We discussed the risks of the procedure, including infection, bleeding, tissue injury, and further surgery. Informed, written consent was given. The usual time-out protocol was performed immediately prior to the procedure. Using mammographic guidance, sterile technique, 1% lidocaine and a 7 cm modified Kopans needle, the posterosuperior aspect of UPPER-OUTER RIGHT breast DCIS calcifications were localized using a LATERAL approach. DCIS calcifications lie just anterior to the mid-distal thick part of wire. Using mammographic guidance, sterile technique, 1% lidocaine and a 7 cm modified Kopans needle, the posteroinferior aspect of UPPER-OUTER RIGHT breast DCIS calcifications were localized using a LATERAL approach. DCIS calcifications lie just anterior to the distal thick portion and tip of the wire. Using mammographic guidance, sterile technique, 1% lidocaine and a 5 cm modified Kopans needle, the anterior aspect of UPPER-OUTER RIGHT breast DCIS calcifications and X biopsy clip were localized using a LATERAL approach. DCIS calcifications lies 0.8 cm anterior to the thick portion of wire. The images were marked for Dr. Bary Castilla. IMPRESSION: Needle wire localization/bracketing of the UPPER-OUTER RIGHT  breast DCIS calcifications. No apparent complications. Electronically Signed   By: Margarette Canada M.D.   On: 10/20/2021 11:19  MM RT PLC BREAST LOC DEV   EA ADD LESION  INC MAMMO GUIDE  Result Date: 10/20/2021 CLINICAL DATA:  66 year old female for wire bracketing of RIGHT breast DCIS calcifications prior to lumpectomy. EXAM: NEEDLE LOCALIZATION OF THE RIGHT BREAST WITH MAMMO GUIDANCE X 3 COMPARISON:  Previous exam(s). PROCEDURE: Patient presents for needle localization prior to RIGHT lumpectomy. I met with the patient and we discussed the procedure of needle localization including benefits and alternatives. We discussed the high likelihood of a successful procedure. We discussed the risks of the procedure, including infection, bleeding, tissue injury, and further surgery. Informed, written consent was given. The usual time-out protocol was performed immediately prior to the procedure. Using mammographic guidance, sterile technique, 1% lidocaine and a 7 cm modified Kopans needle, the posterosuperior aspect of UPPER-OUTER  RIGHT breast DCIS calcifications were localized using a LATERAL approach. DCIS calcifications lie just anterior to the mid-distal thick part of wire. Using mammographic guidance, sterile technique, 1% lidocaine and a 7 cm modified Kopans needle, the posteroinferior aspect of UPPER-OUTER RIGHT breast DCIS calcifications were localized using a LATERAL approach. DCIS calcifications lie just anterior to the distal thick portion and tip of the wire. Using mammographic guidance, sterile technique, 1% lidocaine and a 5 cm modified Kopans needle, the anterior aspect of UPPER-OUTER RIGHT breast DCIS calcifications and X biopsy clip were localized using a LATERAL approach. DCIS calcifications lies 0.8 cm anterior to the thick portion of wire. The images were marked for Dr. Bary Castilla. IMPRESSION: Needle wire localization/bracketing of the UPPER-OUTER RIGHT breast DCIS calcifications. No apparent complications.  Electronically Signed   By: Margarette Canada M.D.   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  PLAN:    Stage Ia invasive triple positive carcinoma of the right breast: Given positive margins and the high-grade nature of her DCIS, agree with surgery and recommend reexcision.  We once again discussed at length the benefits of reexcision and sentinel node biopsy, adjuvant chemotherapy with Taxol and Herceptin, adjuvant XRT, and treatment with aromatase inhibitor.  Patient expressed understanding but not undergoing the recommended treatment increases her risk of recurrence.  She continues to remain unclear of what she wants to do next.  Plan is to follow-up with nurse navigator by phone and make a final decision within the next week.  No follow-up has been scheduled at this time will be based on patient's treatment decisions.   I spent a total of 30 minutes reviewing chart data, face-to-face evaluation with the patient, counseling and coordination of care as detailed above.   Patient expressed understanding and was in agreement with this plan. She also understands that She can call clinic at any time with any questions, concerns, or complaints.    Cancer Staging  Invasive ductal carcinoma of right breast [facility]) Staging form: Breast, AJCC 8th Edition - Pathologic stage from 11/11/2021: Stage IA (pT1b, pN0, cM0, G3, ER+, PR+, HER2+) - Signed by Lloyd Huger, MD on 11/11/2021 Stage prefix: Initial diagnosis Histologic grading system: 3 grade system   Lloyd Huger, MD   11/18/2021 1:04 PM

## 2021-11-16 DIAGNOSIS — C50411 Malignant neoplasm of upper-outer quadrant of right female breast: Secondary | ICD-10-CM | POA: Diagnosis not present

## 2021-11-16 DIAGNOSIS — Z170 Estrogen receptor positive status [ER+]: Secondary | ICD-10-CM | POA: Diagnosis not present

## 2021-11-17 ENCOUNTER — Encounter: Payer: Self-pay | Admitting: *Deleted

## 2021-11-17 NOTE — Progress Notes (Signed)
Spoke to Ms. Kaitlyn Ali about CT simulation being cancelled pending her decisions on treatment recommendations.  She will still meet with Dr. Grayland Ormond tomorrow at 10:15.   Discussed the rationale for re-excision and SLN biopsy as well as chemotherapy.  Will accompany her to med onc appt. Tomorrow.

## 2021-11-18 ENCOUNTER — Encounter: Payer: Self-pay | Admitting: *Deleted

## 2021-11-18 ENCOUNTER — Encounter: Payer: Self-pay | Admitting: Oncology

## 2021-11-18 ENCOUNTER — Ambulatory Visit: Payer: Medicare HMO

## 2021-11-18 ENCOUNTER — Ambulatory Visit: Payer: Medicare HMO | Admitting: Oncology

## 2021-11-18 ENCOUNTER — Inpatient Hospital Stay: Payer: Medicare HMO | Admitting: Oncology

## 2021-11-18 VITALS — BP 138/95 | HR 113 | Temp 98.7°F | Ht 68.0 in | Wt 194.0 lb

## 2021-11-18 DIAGNOSIS — C50911 Malignant neoplasm of unspecified site of right female breast: Secondary | ICD-10-CM | POA: Diagnosis not present

## 2021-11-18 DIAGNOSIS — Z170 Estrogen receptor positive status [ER+]: Secondary | ICD-10-CM | POA: Diagnosis not present

## 2021-11-18 DIAGNOSIS — D0511 Intraductal carcinoma in situ of right breast: Secondary | ICD-10-CM | POA: Diagnosis not present

## 2021-11-18 DIAGNOSIS — F17210 Nicotine dependence, cigarettes, uncomplicated: Secondary | ICD-10-CM | POA: Diagnosis not present

## 2021-11-18 NOTE — Progress Notes (Signed)
Met with patient during medical oncology appt.   She is still trying to decide on what she would like to do moving forward.   I will call her on Monday to see what she decides.

## 2021-11-22 ENCOUNTER — Encounter: Payer: Self-pay | Admitting: *Deleted

## 2021-11-22 NOTE — Progress Notes (Signed)
Spoke with Kaitlyn Ali about how she would like to proceed.  She has decided she does not want to have re-excision and SLN biopsy or receive chemotherapy.   She would like to proceed with radiation only.   She has a follow up with Dr. Baruch Gouty on Wednesday 8/23 to discuss.

## 2021-11-24 ENCOUNTER — Encounter: Payer: Self-pay | Admitting: *Deleted

## 2021-11-24 ENCOUNTER — Encounter: Payer: Self-pay | Admitting: Radiation Oncology

## 2021-11-24 ENCOUNTER — Ambulatory Visit
Admission: RE | Admit: 2021-11-24 | Discharge: 2021-11-24 | Disposition: A | Discharge status: Home / Self Care | Payer: Medicare HMO | Source: Ambulatory Visit | Attending: Radiation Oncology | Admitting: Radiation Oncology

## 2021-11-24 VITALS — BP 140/98 | HR 99 | Temp 97.1°F | Resp 16 | Ht 68.0 in | Wt 196.1 lb

## 2021-11-24 DIAGNOSIS — D0511 Intraductal carcinoma in situ of right breast: Secondary | ICD-10-CM | POA: Diagnosis not present

## 2021-11-24 DIAGNOSIS — Z170 Estrogen receptor positive status [ER+]: Secondary | ICD-10-CM | POA: Diagnosis not present

## 2021-11-24 NOTE — Progress Notes (Signed)
Met with patient during follow up appt. With Dr. Baruch Gouty.  Dr. Baruch Gouty will not offer radiation until Ms. Martinique has re-excision and SLNB done.   She agreed to surgery, message sent to Dr. Bary Castilla and Ledell Noss with her decision.  Selinda Eon will contact patient with appointments.

## 2021-11-24 NOTE — Progress Notes (Signed)
Radiation Oncology Follow up Note  Name: Kaitlyn Ali   Date:   11/24/2021 MRN:  [id number] DOB: [birth year]    This 66 y.o. female presents to the clinic today for follow-up discussion of her recent wide local excision.  For small focus of invasive mammary carcinoma found in wide local excision for ductal carcinoma in situ  REFERRING PROVIDER: Juline Patch, MD  HPI: Patient is a 66 year old female presented with suspicious calcifications spanning 6 cm in the right breast.  Stereotactic biopsy was positive for ductal carcinoma in situ.  She had a wide local excision showing a 7 mm area of invasive mammary carcinoma grade 3.  There are multiple foci of invasive carcinoma present there is also high-grade DCIS with comedonecrosis.  Margins were clear 1 mm for the invasive component DCIS was involved laterally anterior multifocal.  No lymph nodes were sampled.  Tumor turned out to be triple positive ER/PR and HER2/neu overexpressed.  At this time recommendations been made for reexcision based on the positive DCIS margins as well as sentinel node biopsy.  She is seen today for that discussion..  COMPLICATIONS OF TREATMENT: none  FOLLOW UP COMPLIANCE: keeps appointments   PHYSICAL EXAM:  BP (!) 140/98   Pulse 99   Temp (!) 97.1 F (36.2 C)   Resp 16   Ht '5\' 8"'  (1.727 m)   Wt 196 lb 1.6 oz (89 kg)   BMI 29.82 kg/m  Exam was not performed  RADIOLOGY RESULTS: Mammograms reviewed pathology report reviewed  PLAN: At this time the consensus among medical oncology surgical oncology and myself is to have a reexcision based on the multiple positive margins of DCIS and the fact that this is a grade 3 HER2/neu overexpressed the lesion.  She also benefit from sentinel node biopsy to better delineate my treatment fields should she go ahead with radiation therapy.  She is somewhat adamant about refusing chemotherapy although she may consider Herceptin.  I have told her I am not radiating her at  this point time until she makes a firm decision about her further treatment.  She will be in contact with our nurse navigator.  I would like to take this opportunity to thank you for allowing me to participate in the care of your patient.Noreene Filbert, MD

## 2021-12-02 ENCOUNTER — Encounter: Payer: Self-pay | Admitting: Licensed Clinical Social Worker

## 2021-12-02 ENCOUNTER — Encounter: Payer: Self-pay | Admitting: *Deleted

## 2021-12-02 ENCOUNTER — Inpatient Hospital Stay (HOSPITAL_BASED_OUTPATIENT_CLINIC_OR_DEPARTMENT_OTHER): Payer: Medicare HMO | Admitting: Licensed Clinical Social Worker

## 2021-12-02 ENCOUNTER — Inpatient Hospital Stay: Payer: Medicare HMO

## 2021-12-02 ENCOUNTER — Ambulatory Visit
Admission: RE | Admit: 2021-12-02 | Discharge: 2021-12-02 | Disposition: A | Discharge status: Home / Self Care | Payer: Medicare HMO | Source: Ambulatory Visit | Attending: Radiation Oncology | Admitting: Radiation Oncology

## 2021-12-02 DIAGNOSIS — Z803 Family history of malignant neoplasm of breast: Secondary | ICD-10-CM | POA: Diagnosis not present

## 2021-12-02 DIAGNOSIS — M25551 Pain in right hip: Secondary | ICD-10-CM | POA: Diagnosis not present

## 2021-12-02 DIAGNOSIS — M25571 Pain in right ankle and joints of right foot: Secondary | ICD-10-CM | POA: Diagnosis not present

## 2021-12-02 DIAGNOSIS — M25511 Pain in right shoulder: Secondary | ICD-10-CM | POA: Diagnosis not present

## 2021-12-02 DIAGNOSIS — M25562 Pain in left knee: Secondary | ICD-10-CM | POA: Diagnosis not present

## 2021-12-02 DIAGNOSIS — D0511 Intraductal carcinoma in situ of right breast: Secondary | ICD-10-CM | POA: Diagnosis not present

## 2021-12-02 DIAGNOSIS — C50811 Malignant neoplasm of overlapping sites of right female breast: Secondary | ICD-10-CM

## 2021-12-02 DIAGNOSIS — Z79891 Long term (current) use of opiate analgesic: Secondary | ICD-10-CM | POA: Diagnosis not present

## 2021-12-02 DIAGNOSIS — Z170 Estrogen receptor positive status [ER+]: Secondary | ICD-10-CM | POA: Diagnosis not present

## 2021-12-02 DIAGNOSIS — M25552 Pain in left hip: Secondary | ICD-10-CM | POA: Diagnosis not present

## 2021-12-02 DIAGNOSIS — G894 Chronic pain syndrome: Secondary | ICD-10-CM | POA: Diagnosis not present

## 2021-12-02 DIAGNOSIS — C50911 Malignant neoplasm of unspecified site of right female breast: Secondary | ICD-10-CM

## 2021-12-02 NOTE — Progress Notes (Signed)
Radiation Oncology Follow up Note  Name: Kaitlyn Ali   Date:   12/02/2021 MRN:  [id number] DOB: 1956-03-31    This 66 y.o. female presents to the clinic today for discussion regarding treatment options for her mostly ductal carcinoma in situ of the right breast status post wide local excision with a small centimeter focus of invasive mammary carcinoma triple positive.  REFERRING PROVIDER: Juline Patch, MD  HPI: Patient is a 66 year old female who underwent a wide local excision for DCIS showing a 7 mm area of invasive mammary carcinoma grade 3 with triple positive disease.  Tumor was HER2/neu overexpressed.  She is declining further surgery based on focal positive DCIS margin.  She otherwise is doing well.  Patient is also at this point declining chemotherapy although we have had a discussion today about Herceptin therapy..  COMPLICATIONS OF TREATMENT: none  FOLLOW UP COMPLIANCE: keeps appointments   PHYSICAL EXAM:  There were no vitals taken for this visit. Lungs are clear to A&P cardiac examination essentially unremarkable with regular rate and rhythm. No dominant mass or nodularity is noted in either breast in 2 positions examined. Incision is well-healed. No axillary or supraclavicular adenopathy is appreciated. Cosmetic result is excellent.  RADIOLOGY RESULTS: No current films to review  PLAN: At this time I am going to go ahead with whole breast and peripheral lymphatic radiation therapy to her right breast.  I have done the [facility] nomogram for possible involvement of sentinel nodes based on her clinical factors showing a 19% chance of possible sentinel node involvement of microscopic disease.  We will plan on delivering 5040 cGy in 28 fractions to her breast and lymphatics.  I would also boost her scar another 1600 centigrade based on the focal positive DCIS margin.  I believe this will ensure long-term control of her right breast.  I have also discussed  possibility of Herceptin as a single agent she will think about that we will refer her back to Dr. Grayland Ormond should she decide to go with that treatment plan.  Patient comprehends my recommendations well.  Simulation was arranged for next week.  I would like to take this opportunity to thank you for allowing me to participate in the care of your patient.Noreene Filbert, MD

## 2021-12-02 NOTE — Progress Notes (Signed)
REFERRING PROVIDER: Lloyd Huger, MD [place],  [place] [id number]  PRIMARY PROVIDER:  Juline Patch, MD  PRIMARY REASON FOR VISIT:  1. Invasive ductal carcinoma of right breast ([place])   2. Family history of breast cancer      HISTORY OF PRESENT ILLNESS:   Kaitlyn Ali, a 66 y.o. female, was seen for a [place] cancer genetics consultation at the request of Dr. Grayland Ormond due to a personal and family history of cancer.  Kaitlyn Ali presents to clinic today to discuss the possibility of a hereditary predisposition to cancer, genetic testing, and to further clarify her future cancer risks, as well as potential cancer risks for family members.    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 She had lumpectomy on 10/20/2021,  which showed invasive ductal carcinoma, ER/PR/HER2+.   Oncology History  Ductal carcinoma in situ (DCIS) of right breast (Resolved)  10/12/2021 Initial Diagnosis   Ductal carcinoma in situ (DCIS) of right breast   10/15/2021 Cancer Staging   Staging form: Breast, AJCC 8th Edition - Clinical stage from 10/15/2021: Stage 0 (cTis (DCIS), cN0, cM0, ER+, PR: Not Assessed, HER2: Not Assessed) - Signed by Lloyd Huger, MD on 10/15/2021 Stage prefix: Initial diagnosis Nuclear grade: G3   Invasive ductal carcinoma of right breast ([place])  11/11/2021 Initial Diagnosis   Invasive ductal carcinoma of right breast ([place])   11/11/2021 Cancer Staging   Staging form: Breast, AJCC 8th Edition - Pathologic stage from 11/11/2021: Stage IA (pT1b, pN0, cM0, G3, ER+, PR+, HER2+) - Signed by Lloyd Huger, MD on 11/11/2021 Stage prefix: Initial diagnosis Histologic grading system: 3 grade system     RISK FACTORS:  Menarche was at age 6.  First live birth at age 56.  Ovaries intact: yes.  Hysterectomy: no.  Menopausal status: postmenopausal.  Colonoscopy: yes;  few polyps .  Past Medical  History:  Diagnosis Date   Anxiety    Bursitis    Chronic pain syndrome    DCIS (ductal carcinoma in situ) of breast 09/2021   RT breast   Depression    GERD (gastroesophageal reflux disease)    h/o with pregnancy   Hypertension    Panic attack     Past Surgical History:  Procedure Laterality Date   BREAST BIOPSY Right 09/27/2021   UOQ mid lateral calcs, coil marker, DCIS   BREAST BIOPSY Right 09/27/2021   UOQ calcs anterior, X marker, DCIS   BREAST BIOPSY Left 09/27/2021   Distortion, ribbon marker, FIBROSIS AND USUAL DUCTAL HYPERPLASIA   BREAST LUMPECTOMY WITH NEEDLE LOCALIZATION Right 10/20/2021   Procedure: BREAST LUMPECTOMY WITH NEEDLE LOCALIZATION;  Surgeon: Robert Bellow, MD;  Location: [facility] ORS;  Service: General;  Laterality: Right;   LAPAROSCOPIC HYSTERECTOMY     supracervical hyst due to AUB; Dr. Davis Gourd   TOE SURGERY     shaved bone in R) great toe   TONSILLECTOMY     wire placement Right 10/20/2021   bracking for lumpectomy    FAMILY HISTORY:  We obtained a detailed, 4-generation family history.  Significant diagnoses are listed below: Family History  Problem Relation Age of Onset   Diabetes Mother    Liver cancer Father 29   Breast cancer Sister 76       NEG BRCA testing   Brain cancer Paternal Uncle    Leukemia Maternal Grandfather    Breast  cancer Cousin        dx 30s-40s   Kaitlyn Ali has 2 daughters, 29 and 22. She has 1 sister who had breast cancer at 7 and a recurrence at 62. She reportedly had negative BRCA1/BRCA2 testing.   Ms. Happe mother passed at 37. Maternal grandfather had leukemia in his 75s. No other known cancers on this side of the family.  Ms. Gohr father died of liver cancer at 81. Patient had 1 paternal uncle with brain cancer, and 86 other paternal aunts/uncles with no known cancers. A paternal first cousin once removed had breast cancer in her 70s-40s, unknown if she had genetic testing. No other known cancers on  this side of the family.  Kaitlyn Ali is unaware of previous family history of genetic testing for hereditary cancer risks. There is no reported Ashkenazi Jewish ancestry. There is no known consanguinity.    GENETIC COUNSELING ASSESSMENT: Kaitlyn Ali is a 66 y.o. female with a personal and family history of breast cancer which is somewhat suggestive of a hereditary cancer syndrome and predisposition to cancer. We, therefore, discussed and recommended the following at today's visit.   DISCUSSION: We discussed that approximately 10% of breast cancer is hereditary. Most cases of hereditary breast cancer are associated with BRCA1/BRCA2 genes, although there are other genes associated with hereditary cancer as well. Cancers and risks are gene specific. We discussed that testing is beneficial for several reasons including knowing about cancer risks, identifying potential screening and risk-reduction options that may be appropriate, and to understand if other family members could be at risk for cancer and allow them to undergo genetic testing.   We reviewed the characteristics, features and inheritance patterns of hereditary cancer syndromes. We also discussed genetic testing, including the appropriate family members to test, the process of testing, insurance coverage and turn-around-time for results. We discussed the implications of a negative, positive and/or variant of uncertain significant result. We recommended Kaitlyn Ali pursue genetic testing for the [facility] Common Hereditary Cancers+RNA gene panel.   Based on Ms. Latu personal and family history of cancer, she meets medical criteria for genetic testing. Despite that she meets criteria, she may still have an out of pocket cost. We discussed that if her out of pocket cost for testing is over $100, the laboratory will call and confirm whether she wants to proceed with testing.  If the out of pocket cost of testing is less than $100 she will be billed by  the genetic testing laboratory.   PLAN: After considering the risks, benefits, and limitations,  Kaitlyn Ali did not wish to pursue genetic testing at today's visit. We understand this decision and remain available to coordinate genetic testing at any time in the future. We, therefore, recommend Kaitlyn Ali continue to follow the cancer screening guidelines given by her primary healthcare provider.  Based on Ms. Barron family history, we recommended her sister have  updated genetic counseling and testing since she only had BRCA1/2 tested. Kaitlyn Ali will let us know if we can be of any assistance in coordinating genetic counseling and/or testing for this family member.   Ms. Hostetter questions were answered to her satisfaction today. Our contact information was provided should additional questions or concerns arise. Thank you for the referral and allowing [place] to share in the care of your patient.   Kaitlyn Rogue, MS, [facility] Genetic Counselor [email or web address] Phone: [phone]  The patient was seen for a total of 25 minutes in face-to-face genetic counseling.  Dr.  Grayland Ormond was available for discussion regarding this case.   _______________________________________________________________________ For Office Staff:  Number of people involved in session: 1 Was an Intern/ student involved with case: no

## 2021-12-02 NOTE — Progress Notes (Signed)
Met with patient today after her genetics counseling appointment.   Discussed her thoughts on her treatment moving forward.  She is adamant that she does not want surgery and asked if she could talk to Dr. Baruch Gouty again.    Dr. Baruch Gouty agreed to meet with her and he has decided to go ahead and give her radiation without further surgery prior.   She has a CT sim planned for 12/08/21.

## 2021-12-08 ENCOUNTER — Ambulatory Visit
Admission: RE | Admit: 2021-12-08 | Discharge: 2021-12-08 | Disposition: A | Discharge status: Home / Self Care | Payer: Medicare HMO | Source: Ambulatory Visit | Attending: Radiation Oncology | Admitting: Radiation Oncology

## 2021-12-08 DIAGNOSIS — Z170 Estrogen receptor positive status [ER+]: Secondary | ICD-10-CM | POA: Insufficient documentation

## 2021-12-08 DIAGNOSIS — C50411 Malignant neoplasm of upper-outer quadrant of right female breast: Secondary | ICD-10-CM | POA: Insufficient documentation

## 2021-12-08 DIAGNOSIS — Z510 Encounter for antineoplastic radiation therapy: Secondary | ICD-10-CM | POA: Insufficient documentation

## 2021-12-08 DIAGNOSIS — D0511 Intraductal carcinoma in situ of right breast: Secondary | ICD-10-CM | POA: Diagnosis not present

## 2021-12-10 ENCOUNTER — Other Ambulatory Visit: Payer: Self-pay | Admitting: *Deleted

## 2021-12-10 DIAGNOSIS — D0511 Intraductal carcinoma in situ of right breast: Secondary | ICD-10-CM

## 2021-12-13 DIAGNOSIS — D0511 Intraductal carcinoma in situ of right breast: Secondary | ICD-10-CM | POA: Diagnosis not present

## 2021-12-13 DIAGNOSIS — Z510 Encounter for antineoplastic radiation therapy: Secondary | ICD-10-CM | POA: Diagnosis not present

## 2021-12-13 DIAGNOSIS — Z170 Estrogen receptor positive status [ER+]: Secondary | ICD-10-CM | POA: Diagnosis not present

## 2021-12-13 DIAGNOSIS — C50411 Malignant neoplasm of upper-outer quadrant of right female breast: Secondary | ICD-10-CM | POA: Diagnosis not present

## 2021-12-15 ENCOUNTER — Ambulatory Visit: Admission: RE | Admit: 2021-12-15 | Payer: Medicare HMO | Source: Ambulatory Visit

## 2021-12-15 ENCOUNTER — Encounter: Payer: Self-pay | Admitting: *Deleted

## 2021-12-15 DIAGNOSIS — C50411 Malignant neoplasm of upper-outer quadrant of right female breast: Secondary | ICD-10-CM | POA: Diagnosis not present

## 2021-12-15 DIAGNOSIS — Z170 Estrogen receptor positive status [ER+]: Secondary | ICD-10-CM | POA: Diagnosis not present

## 2021-12-15 DIAGNOSIS — D0511 Intraductal carcinoma in situ of right breast: Secondary | ICD-10-CM | POA: Diagnosis not present

## 2021-12-15 DIAGNOSIS — Z510 Encounter for antineoplastic radiation therapy: Secondary | ICD-10-CM | POA: Diagnosis not present

## 2021-12-16 ENCOUNTER — Other Ambulatory Visit: Payer: Self-pay

## 2021-12-16 ENCOUNTER — Ambulatory Visit
Admission: RE | Admit: 2021-12-16 | Discharge: 2021-12-16 | Disposition: A | Discharge status: Home / Self Care | Payer: Medicare HMO | Source: Ambulatory Visit | Attending: Radiation Oncology | Admitting: Radiation Oncology

## 2021-12-16 DIAGNOSIS — Z510 Encounter for antineoplastic radiation therapy: Secondary | ICD-10-CM | POA: Diagnosis not present

## 2021-12-16 DIAGNOSIS — Z170 Estrogen receptor positive status [ER+]: Secondary | ICD-10-CM | POA: Diagnosis not present

## 2021-12-16 DIAGNOSIS — C50411 Malignant neoplasm of upper-outer quadrant of right female breast: Secondary | ICD-10-CM | POA: Diagnosis not present

## 2021-12-16 DIAGNOSIS — D0511 Intraductal carcinoma in situ of right breast: Secondary | ICD-10-CM | POA: Diagnosis not present

## 2021-12-16 LAB — RAD ONC ARIA SESSION SUMMARY
Course Elapsed Days: 0
Plan Fractions Treated to Date: 1
Plan Prescribed Dose Per Fraction: 1.8 Gy
Plan Total Fractions Prescribed: 28
Plan Total Prescribed Dose: 50.4 Gy
Reference Point Dosage Given to Date: 1.8 Gy
Reference Point Session Dosage Given: 1.8 Gy
Session Number: 1

## 2021-12-17 ENCOUNTER — Ambulatory Visit
Admission: RE | Admit: 2021-12-17 | Discharge: 2021-12-17 | Disposition: A | Discharge status: Home / Self Care | Payer: Medicare HMO | Source: Ambulatory Visit | Attending: Radiation Oncology | Admitting: Radiation Oncology

## 2021-12-17 ENCOUNTER — Other Ambulatory Visit: Payer: Self-pay

## 2021-12-17 DIAGNOSIS — Z510 Encounter for antineoplastic radiation therapy: Secondary | ICD-10-CM | POA: Diagnosis not present

## 2021-12-17 DIAGNOSIS — C50411 Malignant neoplasm of upper-outer quadrant of right female breast: Secondary | ICD-10-CM | POA: Diagnosis not present

## 2021-12-17 DIAGNOSIS — D0511 Intraductal carcinoma in situ of right breast: Secondary | ICD-10-CM | POA: Diagnosis not present

## 2021-12-17 DIAGNOSIS — Z170 Estrogen receptor positive status [ER+]: Secondary | ICD-10-CM | POA: Diagnosis not present

## 2021-12-17 LAB — RAD ONC ARIA SESSION SUMMARY
Course Elapsed Days: 1
Plan Fractions Treated to Date: 2
Plan Prescribed Dose Per Fraction: 1.8 Gy
Plan Total Fractions Prescribed: 28
Plan Total Prescribed Dose: 50.4 Gy
Reference Point Dosage Given to Date: 3.6 Gy
Reference Point Session Dosage Given: 1.8 Gy
Session Number: 2

## 2021-12-20 ENCOUNTER — Other Ambulatory Visit: Payer: Self-pay

## 2021-12-20 ENCOUNTER — Ambulatory Visit
Admission: RE | Admit: 2021-12-20 | Discharge: 2021-12-20 | Disposition: A | Discharge status: Home / Self Care | Payer: Medicare HMO | Source: Ambulatory Visit | Attending: Radiation Oncology | Admitting: Radiation Oncology

## 2021-12-20 DIAGNOSIS — Z170 Estrogen receptor positive status [ER+]: Secondary | ICD-10-CM | POA: Diagnosis not present

## 2021-12-20 DIAGNOSIS — Z510 Encounter for antineoplastic radiation therapy: Secondary | ICD-10-CM | POA: Diagnosis not present

## 2021-12-20 DIAGNOSIS — D0511 Intraductal carcinoma in situ of right breast: Secondary | ICD-10-CM | POA: Diagnosis not present

## 2021-12-20 DIAGNOSIS — C50411 Malignant neoplasm of upper-outer quadrant of right female breast: Secondary | ICD-10-CM | POA: Diagnosis not present

## 2021-12-20 LAB — RAD ONC ARIA SESSION SUMMARY
Course Elapsed Days: 4
Plan Fractions Treated to Date: 3
Plan Prescribed Dose Per Fraction: 1.8 Gy
Plan Total Fractions Prescribed: 28
Plan Total Prescribed Dose: 50.4 Gy
Reference Point Dosage Given to Date: 5.4 Gy
Reference Point Session Dosage Given: 1.8 Gy
Session Number: 3

## 2021-12-21 ENCOUNTER — Ambulatory Visit
Admission: RE | Admit: 2021-12-21 | Discharge: 2021-12-21 | Disposition: A | Discharge status: Home / Self Care | Payer: Medicare HMO | Source: Ambulatory Visit | Attending: Radiation Oncology | Admitting: Radiation Oncology

## 2021-12-21 ENCOUNTER — Other Ambulatory Visit: Payer: Self-pay

## 2021-12-21 DIAGNOSIS — Z170 Estrogen receptor positive status [ER+]: Secondary | ICD-10-CM | POA: Diagnosis not present

## 2021-12-21 DIAGNOSIS — D0511 Intraductal carcinoma in situ of right breast: Secondary | ICD-10-CM | POA: Diagnosis not present

## 2021-12-21 DIAGNOSIS — Z510 Encounter for antineoplastic radiation therapy: Secondary | ICD-10-CM | POA: Diagnosis not present

## 2021-12-21 DIAGNOSIS — C50411 Malignant neoplasm of upper-outer quadrant of right female breast: Secondary | ICD-10-CM | POA: Diagnosis not present

## 2021-12-21 LAB — RAD ONC ARIA SESSION SUMMARY
Course Elapsed Days: 5
Plan Fractions Treated to Date: 4
Plan Prescribed Dose Per Fraction: 1.8 Gy
Plan Total Fractions Prescribed: 28
Plan Total Prescribed Dose: 50.4 Gy
Reference Point Dosage Given to Date: 7.2 Gy
Reference Point Session Dosage Given: 1.8 Gy
Session Number: 4

## 2021-12-22 ENCOUNTER — Ambulatory Visit
Admission: RE | Admit: 2021-12-22 | Discharge: 2021-12-22 | Disposition: A | Discharge status: Home / Self Care | Payer: Medicare HMO | Source: Ambulatory Visit | Attending: Radiation Oncology | Admitting: Radiation Oncology

## 2021-12-22 ENCOUNTER — Other Ambulatory Visit: Payer: Self-pay

## 2021-12-22 DIAGNOSIS — Z510 Encounter for antineoplastic radiation therapy: Secondary | ICD-10-CM | POA: Diagnosis not present

## 2021-12-22 DIAGNOSIS — Z170 Estrogen receptor positive status [ER+]: Secondary | ICD-10-CM | POA: Diagnosis not present

## 2021-12-22 DIAGNOSIS — C50411 Malignant neoplasm of upper-outer quadrant of right female breast: Secondary | ICD-10-CM | POA: Diagnosis not present

## 2021-12-22 DIAGNOSIS — D0511 Intraductal carcinoma in situ of right breast: Secondary | ICD-10-CM | POA: Diagnosis not present

## 2021-12-22 LAB — RAD ONC ARIA SESSION SUMMARY
Course Elapsed Days: 6
Plan Fractions Treated to Date: 5
Plan Prescribed Dose Per Fraction: 1.8 Gy
Plan Total Fractions Prescribed: 28
Plan Total Prescribed Dose: 50.4 Gy
Reference Point Dosage Given to Date: 9 Gy
Reference Point Session Dosage Given: 1.8 Gy
Session Number: 5

## 2021-12-23 ENCOUNTER — Ambulatory Visit
Admission: RE | Admit: 2021-12-23 | Discharge: 2021-12-23 | Disposition: A | Discharge status: Home / Self Care | Payer: Medicare HMO | Source: Ambulatory Visit | Attending: Radiation Oncology | Admitting: Radiation Oncology

## 2021-12-23 ENCOUNTER — Other Ambulatory Visit: Payer: Self-pay

## 2021-12-23 DIAGNOSIS — Z510 Encounter for antineoplastic radiation therapy: Secondary | ICD-10-CM | POA: Diagnosis not present

## 2021-12-23 DIAGNOSIS — Z170 Estrogen receptor positive status [ER+]: Secondary | ICD-10-CM | POA: Diagnosis not present

## 2021-12-23 DIAGNOSIS — L718 Other rosacea: Secondary | ICD-10-CM | POA: Diagnosis not present

## 2021-12-23 DIAGNOSIS — L570 Actinic keratosis: Secondary | ICD-10-CM | POA: Diagnosis not present

## 2021-12-23 DIAGNOSIS — D239 Other benign neoplasm of skin, unspecified: Secondary | ICD-10-CM | POA: Diagnosis not present

## 2021-12-23 DIAGNOSIS — C50411 Malignant neoplasm of upper-outer quadrant of right female breast: Secondary | ICD-10-CM | POA: Diagnosis not present

## 2021-12-23 DIAGNOSIS — D0511 Intraductal carcinoma in situ of right breast: Secondary | ICD-10-CM | POA: Diagnosis not present

## 2021-12-23 DIAGNOSIS — L821 Other seborrheic keratosis: Secondary | ICD-10-CM | POA: Diagnosis not present

## 2021-12-23 LAB — RAD ONC ARIA SESSION SUMMARY
Course Elapsed Days: 7
Plan Fractions Treated to Date: 6
Plan Prescribed Dose Per Fraction: 1.8 Gy
Plan Total Fractions Prescribed: 28
Plan Total Prescribed Dose: 50.4 Gy
Reference Point Dosage Given to Date: 10.8 Gy
Reference Point Session Dosage Given: 1.8 Gy
Session Number: 6

## 2021-12-24 ENCOUNTER — Ambulatory Visit
Admission: RE | Admit: 2021-12-24 | Discharge: 2021-12-24 | Disposition: A | Discharge status: Home / Self Care | Payer: Medicare HMO | Source: Ambulatory Visit | Attending: Radiation Oncology | Admitting: Radiation Oncology

## 2021-12-24 ENCOUNTER — Other Ambulatory Visit: Payer: Self-pay

## 2021-12-24 DIAGNOSIS — Z170 Estrogen receptor positive status [ER+]: Secondary | ICD-10-CM | POA: Diagnosis not present

## 2021-12-24 DIAGNOSIS — C50411 Malignant neoplasm of upper-outer quadrant of right female breast: Secondary | ICD-10-CM | POA: Diagnosis not present

## 2021-12-24 DIAGNOSIS — D0511 Intraductal carcinoma in situ of right breast: Secondary | ICD-10-CM | POA: Diagnosis not present

## 2021-12-24 DIAGNOSIS — Z510 Encounter for antineoplastic radiation therapy: Secondary | ICD-10-CM | POA: Diagnosis not present

## 2021-12-24 LAB — RAD ONC ARIA SESSION SUMMARY
Course Elapsed Days: 8
Plan Fractions Treated to Date: 7
Plan Prescribed Dose Per Fraction: 1.8 Gy
Plan Total Fractions Prescribed: 28
Plan Total Prescribed Dose: 50.4 Gy
Reference Point Dosage Given to Date: 12.6 Gy
Reference Point Session Dosage Given: 1.8 Gy
Session Number: 7

## 2021-12-27 ENCOUNTER — Other Ambulatory Visit: Payer: Self-pay

## 2021-12-27 ENCOUNTER — Ambulatory Visit
Admission: RE | Admit: 2021-12-27 | Discharge: 2021-12-27 | Disposition: A | Discharge status: Home / Self Care | Payer: Medicare HMO | Source: Ambulatory Visit | Attending: Radiation Oncology | Admitting: Radiation Oncology

## 2021-12-27 DIAGNOSIS — C50411 Malignant neoplasm of upper-outer quadrant of right female breast: Secondary | ICD-10-CM | POA: Diagnosis not present

## 2021-12-27 DIAGNOSIS — Z510 Encounter for antineoplastic radiation therapy: Secondary | ICD-10-CM | POA: Diagnosis not present

## 2021-12-27 DIAGNOSIS — D0511 Intraductal carcinoma in situ of right breast: Secondary | ICD-10-CM | POA: Diagnosis not present

## 2021-12-27 DIAGNOSIS — Z170 Estrogen receptor positive status [ER+]: Secondary | ICD-10-CM | POA: Diagnosis not present

## 2021-12-27 LAB — RAD ONC ARIA SESSION SUMMARY
Course Elapsed Days: 11
Plan Fractions Treated to Date: 8
Plan Prescribed Dose Per Fraction: 1.8 Gy
Plan Total Fractions Prescribed: 28
Plan Total Prescribed Dose: 50.4 Gy
Reference Point Dosage Given to Date: 14.4 Gy
Reference Point Session Dosage Given: 1.8 Gy
Session Number: 8

## 2021-12-28 ENCOUNTER — Inpatient Hospital Stay: Payer: Medicare HMO | Attending: Radiation Oncology

## 2021-12-28 ENCOUNTER — Other Ambulatory Visit: Payer: Self-pay

## 2021-12-28 ENCOUNTER — Ambulatory Visit
Admission: RE | Admit: 2021-12-28 | Discharge: 2021-12-28 | Disposition: A | Discharge status: Home / Self Care | Payer: Medicare HMO | Source: Ambulatory Visit | Attending: Radiation Oncology | Admitting: Radiation Oncology

## 2021-12-28 DIAGNOSIS — D0511 Intraductal carcinoma in situ of right breast: Secondary | ICD-10-CM

## 2021-12-28 DIAGNOSIS — Z170 Estrogen receptor positive status [ER+]: Secondary | ICD-10-CM | POA: Insufficient documentation

## 2021-12-28 DIAGNOSIS — C50411 Malignant neoplasm of upper-outer quadrant of right female breast: Secondary | ICD-10-CM | POA: Diagnosis not present

## 2021-12-28 DIAGNOSIS — Z510 Encounter for antineoplastic radiation therapy: Secondary | ICD-10-CM | POA: Diagnosis not present

## 2021-12-28 DIAGNOSIS — C50911 Malignant neoplasm of unspecified site of right female breast: Secondary | ICD-10-CM | POA: Insufficient documentation

## 2021-12-28 LAB — RAD ONC ARIA SESSION SUMMARY
Course Elapsed Days: 12
Plan Fractions Treated to Date: 9
Plan Prescribed Dose Per Fraction: 1.8 Gy
Plan Total Fractions Prescribed: 28
Plan Total Prescribed Dose: 50.4 Gy
Reference Point Dosage Given to Date: 16.2 Gy
Reference Point Session Dosage Given: 1.8 Gy
Session Number: 9

## 2021-12-28 LAB — CBC
HCT: 45.7 % (ref 36.0–46.0)
Hemoglobin: 15.5 g/dL — ABNORMAL HIGH (ref 12.0–15.0)
MCH: 31.4 pg (ref 26.0–34.0)
MCHC: 33.9 g/dL (ref 30.0–36.0)
MCV: 92.7 fL (ref 80.0–100.0)
Platelets: 321 10*3/uL (ref 150–400)
RBC: 4.93 MIL/uL (ref 3.87–5.11)
RDW: 13.9 % (ref 11.5–15.5)
WBC: 6.9 10*3/uL (ref 4.0–10.5)
nRBC: 0 % (ref 0.0–0.2)

## 2021-12-29 ENCOUNTER — Ambulatory Visit
Admission: RE | Admit: 2021-12-29 | Discharge: 2021-12-29 | Disposition: A | Discharge status: Home / Self Care | Payer: Medicare HMO | Source: Ambulatory Visit | Attending: Radiation Oncology | Admitting: Radiation Oncology

## 2021-12-29 ENCOUNTER — Other Ambulatory Visit: Payer: Self-pay

## 2021-12-29 DIAGNOSIS — Z170 Estrogen receptor positive status [ER+]: Secondary | ICD-10-CM | POA: Diagnosis not present

## 2021-12-29 DIAGNOSIS — Z510 Encounter for antineoplastic radiation therapy: Secondary | ICD-10-CM | POA: Diagnosis not present

## 2021-12-29 DIAGNOSIS — C50411 Malignant neoplasm of upper-outer quadrant of right female breast: Secondary | ICD-10-CM | POA: Diagnosis not present

## 2021-12-29 DIAGNOSIS — D0511 Intraductal carcinoma in situ of right breast: Secondary | ICD-10-CM | POA: Diagnosis not present

## 2021-12-29 LAB — RAD ONC ARIA SESSION SUMMARY
Course Elapsed Days: 13
Plan Fractions Treated to Date: 10
Plan Prescribed Dose Per Fraction: 1.8 Gy
Plan Total Fractions Prescribed: 28
Plan Total Prescribed Dose: 50.4 Gy
Reference Point Dosage Given to Date: 18 Gy
Reference Point Session Dosage Given: 1.8 Gy
Session Number: 10

## 2021-12-30 ENCOUNTER — Other Ambulatory Visit: Payer: Self-pay

## 2021-12-30 ENCOUNTER — Ambulatory Visit
Admission: RE | Admit: 2021-12-30 | Discharge: 2021-12-30 | Disposition: A | Discharge status: Home / Self Care | Payer: Medicare HMO | Source: Ambulatory Visit | Attending: Radiation Oncology | Admitting: Radiation Oncology

## 2021-12-30 DIAGNOSIS — C50411 Malignant neoplasm of upper-outer quadrant of right female breast: Secondary | ICD-10-CM | POA: Diagnosis not present

## 2021-12-30 DIAGNOSIS — Z170 Estrogen receptor positive status [ER+]: Secondary | ICD-10-CM | POA: Diagnosis not present

## 2021-12-30 DIAGNOSIS — D0511 Intraductal carcinoma in situ of right breast: Secondary | ICD-10-CM | POA: Diagnosis not present

## 2021-12-30 DIAGNOSIS — Z510 Encounter for antineoplastic radiation therapy: Secondary | ICD-10-CM | POA: Diagnosis not present

## 2021-12-30 LAB — RAD ONC ARIA SESSION SUMMARY
Course Elapsed Days: 14
Plan Fractions Treated to Date: 11
Plan Prescribed Dose Per Fraction: 1.8 Gy
Plan Total Fractions Prescribed: 28
Plan Total Prescribed Dose: 50.4 Gy
Reference Point Dosage Given to Date: 19.8 Gy
Reference Point Session Dosage Given: 1.8 Gy
Session Number: 11

## 2021-12-31 ENCOUNTER — Ambulatory Visit
Admission: RE | Admit: 2021-12-31 | Discharge: 2021-12-31 | Disposition: A | Discharge status: Home / Self Care | Payer: Medicare HMO | Source: Ambulatory Visit | Attending: Radiation Oncology | Admitting: Radiation Oncology

## 2021-12-31 ENCOUNTER — Other Ambulatory Visit: Payer: Self-pay

## 2021-12-31 DIAGNOSIS — C50411 Malignant neoplasm of upper-outer quadrant of right female breast: Secondary | ICD-10-CM | POA: Diagnosis not present

## 2021-12-31 DIAGNOSIS — D0511 Intraductal carcinoma in situ of right breast: Secondary | ICD-10-CM | POA: Diagnosis not present

## 2021-12-31 DIAGNOSIS — Z510 Encounter for antineoplastic radiation therapy: Secondary | ICD-10-CM | POA: Diagnosis not present

## 2021-12-31 DIAGNOSIS — Z170 Estrogen receptor positive status [ER+]: Secondary | ICD-10-CM | POA: Diagnosis not present

## 2021-12-31 LAB — RAD ONC ARIA SESSION SUMMARY
Course Elapsed Days: 15
Plan Fractions Treated to Date: 12
Plan Prescribed Dose Per Fraction: 1.8 Gy
Plan Total Fractions Prescribed: 28
Plan Total Prescribed Dose: 50.4 Gy
Reference Point Dosage Given to Date: 21.6 Gy
Reference Point Session Dosage Given: 1.8 Gy
Session Number: 12

## 2022-01-03 ENCOUNTER — Other Ambulatory Visit: Payer: Self-pay

## 2022-01-03 ENCOUNTER — Ambulatory Visit
Admission: RE | Admit: 2022-01-03 | Discharge: 2022-01-03 | Disposition: A | Discharge status: Home / Self Care | Payer: Medicare HMO | Source: Ambulatory Visit | Attending: Oncology | Admitting: Oncology

## 2022-01-03 DIAGNOSIS — Z510 Encounter for antineoplastic radiation therapy: Secondary | ICD-10-CM | POA: Diagnosis not present

## 2022-01-03 DIAGNOSIS — C50911 Malignant neoplasm of unspecified site of right female breast: Secondary | ICD-10-CM | POA: Insufficient documentation

## 2022-01-03 DIAGNOSIS — Z170 Estrogen receptor positive status [ER+]: Secondary | ICD-10-CM | POA: Diagnosis not present

## 2022-01-03 DIAGNOSIS — D0511 Intraductal carcinoma in situ of right breast: Secondary | ICD-10-CM | POA: Diagnosis not present

## 2022-01-03 LAB — RAD ONC ARIA SESSION SUMMARY
Course Elapsed Days: 18
Plan Fractions Treated to Date: 13
Plan Prescribed Dose Per Fraction: 1.8 Gy
Plan Total Fractions Prescribed: 28
Plan Total Prescribed Dose: 50.4 Gy
Reference Point Dosage Given to Date: 23.4 Gy
Reference Point Session Dosage Given: 1.8 Gy
Session Number: 13

## 2022-01-04 ENCOUNTER — Ambulatory Visit
Admission: RE | Admit: 2022-01-04 | Discharge: 2022-01-04 | Disposition: A | Discharge status: Home / Self Care | Payer: Medicare HMO | Source: Ambulatory Visit | Attending: Radiation Oncology | Admitting: Radiation Oncology

## 2022-01-04 ENCOUNTER — Other Ambulatory Visit: Payer: Self-pay

## 2022-01-04 DIAGNOSIS — Z510 Encounter for antineoplastic radiation therapy: Secondary | ICD-10-CM | POA: Diagnosis not present

## 2022-01-04 DIAGNOSIS — Z170 Estrogen receptor positive status [ER+]: Secondary | ICD-10-CM | POA: Diagnosis not present

## 2022-01-04 DIAGNOSIS — D0511 Intraductal carcinoma in situ of right breast: Secondary | ICD-10-CM | POA: Diagnosis not present

## 2022-01-04 DIAGNOSIS — C50911 Malignant neoplasm of unspecified site of right female breast: Secondary | ICD-10-CM | POA: Diagnosis not present

## 2022-01-04 LAB — RAD ONC ARIA SESSION SUMMARY
Course Elapsed Days: 19
Plan Fractions Treated to Date: 14
Plan Prescribed Dose Per Fraction: 1.8 Gy
Plan Total Fractions Prescribed: 28
Plan Total Prescribed Dose: 50.4 Gy
Reference Point Dosage Given to Date: 25.2 Gy
Reference Point Session Dosage Given: 1.8 Gy
Session Number: 14

## 2022-01-05 ENCOUNTER — Ambulatory Visit
Admission: RE | Admit: 2022-01-05 | Discharge: 2022-01-05 | Disposition: A | Discharge status: Home / Self Care | Payer: Medicare HMO | Source: Ambulatory Visit | Attending: Radiation Oncology | Admitting: Radiation Oncology

## 2022-01-05 ENCOUNTER — Other Ambulatory Visit: Payer: Self-pay

## 2022-01-05 DIAGNOSIS — C50911 Malignant neoplasm of unspecified site of right female breast: Secondary | ICD-10-CM | POA: Diagnosis not present

## 2022-01-05 DIAGNOSIS — D0511 Intraductal carcinoma in situ of right breast: Secondary | ICD-10-CM | POA: Diagnosis not present

## 2022-01-05 DIAGNOSIS — Z170 Estrogen receptor positive status [ER+]: Secondary | ICD-10-CM | POA: Diagnosis not present

## 2022-01-05 DIAGNOSIS — Z510 Encounter for antineoplastic radiation therapy: Secondary | ICD-10-CM | POA: Diagnosis not present

## 2022-01-05 LAB — RAD ONC ARIA SESSION SUMMARY
Course Elapsed Days: 20
Plan Fractions Treated to Date: 15
Plan Prescribed Dose Per Fraction: 1.8 Gy
Plan Total Fractions Prescribed: 28
Plan Total Prescribed Dose: 50.4 Gy
Reference Point Dosage Given to Date: 27 Gy
Reference Point Session Dosage Given: 1.8 Gy
Session Number: 15

## 2022-01-06 ENCOUNTER — Other Ambulatory Visit: Payer: Self-pay

## 2022-01-06 ENCOUNTER — Ambulatory Visit
Admission: RE | Admit: 2022-01-06 | Discharge: 2022-01-06 | Disposition: A | Discharge status: Home / Self Care | Payer: Medicare HMO | Source: Ambulatory Visit | Attending: Radiation Oncology | Admitting: Radiation Oncology

## 2022-01-06 DIAGNOSIS — D0511 Intraductal carcinoma in situ of right breast: Secondary | ICD-10-CM | POA: Diagnosis not present

## 2022-01-06 DIAGNOSIS — Z170 Estrogen receptor positive status [ER+]: Secondary | ICD-10-CM | POA: Diagnosis not present

## 2022-01-06 DIAGNOSIS — C50911 Malignant neoplasm of unspecified site of right female breast: Secondary | ICD-10-CM | POA: Diagnosis not present

## 2022-01-06 DIAGNOSIS — Z510 Encounter for antineoplastic radiation therapy: Secondary | ICD-10-CM | POA: Diagnosis not present

## 2022-01-06 LAB — RAD ONC ARIA SESSION SUMMARY
Course Elapsed Days: 21
Plan Fractions Treated to Date: 16
Plan Prescribed Dose Per Fraction: 1.8 Gy
Plan Total Fractions Prescribed: 28
Plan Total Prescribed Dose: 50.4 Gy
Reference Point Dosage Given to Date: 28.8 Gy
Reference Point Session Dosage Given: 1.8 Gy
Session Number: 16

## 2022-01-07 ENCOUNTER — Other Ambulatory Visit: Payer: Self-pay

## 2022-01-07 ENCOUNTER — Ambulatory Visit
Admission: RE | Admit: 2022-01-07 | Discharge: 2022-01-07 | Disposition: A | Discharge status: Home / Self Care | Payer: Medicare HMO | Source: Ambulatory Visit | Attending: Radiation Oncology | Admitting: Radiation Oncology

## 2022-01-07 DIAGNOSIS — Z170 Estrogen receptor positive status [ER+]: Secondary | ICD-10-CM | POA: Diagnosis not present

## 2022-01-07 DIAGNOSIS — C50911 Malignant neoplasm of unspecified site of right female breast: Secondary | ICD-10-CM | POA: Diagnosis not present

## 2022-01-07 DIAGNOSIS — Z510 Encounter for antineoplastic radiation therapy: Secondary | ICD-10-CM | POA: Diagnosis not present

## 2022-01-07 DIAGNOSIS — D0511 Intraductal carcinoma in situ of right breast: Secondary | ICD-10-CM | POA: Diagnosis not present

## 2022-01-07 LAB — RAD ONC ARIA SESSION SUMMARY
Course Elapsed Days: 22
Plan Fractions Treated to Date: 17
Plan Prescribed Dose Per Fraction: 1.8 Gy
Plan Total Fractions Prescribed: 28
Plan Total Prescribed Dose: 50.4 Gy
Reference Point Dosage Given to Date: 30.6 Gy
Reference Point Session Dosage Given: 1.8 Gy
Session Number: 17

## 2022-01-10 ENCOUNTER — Ambulatory Visit
Admission: RE | Admit: 2022-01-10 | Discharge: 2022-01-10 | Disposition: A | Discharge status: Home / Self Care | Payer: Medicare HMO | Source: Ambulatory Visit | Attending: Radiation Oncology | Admitting: Radiation Oncology

## 2022-01-10 ENCOUNTER — Other Ambulatory Visit: Payer: Self-pay

## 2022-01-10 DIAGNOSIS — D0511 Intraductal carcinoma in situ of right breast: Secondary | ICD-10-CM | POA: Diagnosis not present

## 2022-01-10 DIAGNOSIS — Z510 Encounter for antineoplastic radiation therapy: Secondary | ICD-10-CM | POA: Diagnosis not present

## 2022-01-10 DIAGNOSIS — C50911 Malignant neoplasm of unspecified site of right female breast: Secondary | ICD-10-CM | POA: Diagnosis not present

## 2022-01-10 DIAGNOSIS — Z170 Estrogen receptor positive status [ER+]: Secondary | ICD-10-CM | POA: Diagnosis not present

## 2022-01-10 LAB — RAD ONC ARIA SESSION SUMMARY
Course Elapsed Days: 25
Plan Fractions Treated to Date: 18
Plan Prescribed Dose Per Fraction: 1.8 Gy
Plan Total Fractions Prescribed: 28
Plan Total Prescribed Dose: 50.4 Gy
Reference Point Dosage Given to Date: 32.4 Gy
Reference Point Session Dosage Given: 1.8 Gy
Session Number: 18

## 2022-01-11 ENCOUNTER — Inpatient Hospital Stay: Payer: Medicare HMO

## 2022-01-11 ENCOUNTER — Ambulatory Visit
Admission: RE | Admit: 2022-01-11 | Discharge: 2022-01-11 | Disposition: A | Discharge status: Home / Self Care | Payer: Medicare HMO | Source: Ambulatory Visit | Attending: Radiation Oncology | Admitting: Radiation Oncology

## 2022-01-11 ENCOUNTER — Other Ambulatory Visit: Payer: Self-pay

## 2022-01-11 DIAGNOSIS — Z170 Estrogen receptor positive status [ER+]: Secondary | ICD-10-CM | POA: Insufficient documentation

## 2022-01-11 DIAGNOSIS — D0511 Intraductal carcinoma in situ of right breast: Secondary | ICD-10-CM

## 2022-01-11 DIAGNOSIS — Z510 Encounter for antineoplastic radiation therapy: Secondary | ICD-10-CM | POA: Diagnosis not present

## 2022-01-11 DIAGNOSIS — C50411 Malignant neoplasm of upper-outer quadrant of right female breast: Secondary | ICD-10-CM | POA: Insufficient documentation

## 2022-01-11 DIAGNOSIS — C50911 Malignant neoplasm of unspecified site of right female breast: Secondary | ICD-10-CM | POA: Diagnosis not present

## 2022-01-11 LAB — CBC
HCT: 43.9 % (ref 36.0–46.0)
Hemoglobin: 14.8 g/dL (ref 12.0–15.0)
MCH: 31.2 pg (ref 26.0–34.0)
MCHC: 33.7 g/dL (ref 30.0–36.0)
MCV: 92.4 fL (ref 80.0–100.0)
Platelets: 282 10*3/uL (ref 150–400)
RBC: 4.75 MIL/uL (ref 3.87–5.11)
RDW: 13.4 % (ref 11.5–15.5)
WBC: 4.9 10*3/uL (ref 4.0–10.5)
nRBC: 0 % (ref 0.0–0.2)

## 2022-01-11 LAB — RAD ONC ARIA SESSION SUMMARY
Course Elapsed Days: 26
Plan Fractions Treated to Date: 19
Plan Prescribed Dose Per Fraction: 1.8 Gy
Plan Total Fractions Prescribed: 28
Plan Total Prescribed Dose: 50.4 Gy
Reference Point Dosage Given to Date: 34.2 Gy
Reference Point Session Dosage Given: 1.8 Gy
Session Number: 19

## 2022-01-12 ENCOUNTER — Other Ambulatory Visit: Payer: Self-pay

## 2022-01-12 ENCOUNTER — Ambulatory Visit
Admission: RE | Admit: 2022-01-12 | Discharge: 2022-01-12 | Disposition: A | Discharge status: Home / Self Care | Payer: Medicare HMO | Source: Ambulatory Visit | Attending: Radiation Oncology | Admitting: Radiation Oncology

## 2022-01-12 DIAGNOSIS — Z170 Estrogen receptor positive status [ER+]: Secondary | ICD-10-CM | POA: Diagnosis not present

## 2022-01-12 DIAGNOSIS — D0511 Intraductal carcinoma in situ of right breast: Secondary | ICD-10-CM | POA: Diagnosis not present

## 2022-01-12 DIAGNOSIS — C50911 Malignant neoplasm of unspecified site of right female breast: Secondary | ICD-10-CM | POA: Diagnosis not present

## 2022-01-12 DIAGNOSIS — Z510 Encounter for antineoplastic radiation therapy: Secondary | ICD-10-CM | POA: Diagnosis not present

## 2022-01-12 LAB — RAD ONC ARIA SESSION SUMMARY
Course Elapsed Days: 27
Plan Fractions Treated to Date: 20
Plan Prescribed Dose Per Fraction: 1.8 Gy
Plan Total Fractions Prescribed: 28
Plan Total Prescribed Dose: 50.4 Gy
Reference Point Dosage Given to Date: 36 Gy
Reference Point Session Dosage Given: 1.8 Gy
Session Number: 20

## 2022-01-13 ENCOUNTER — Other Ambulatory Visit: Payer: Self-pay

## 2022-01-13 ENCOUNTER — Ambulatory Visit: Payer: Medicare HMO

## 2022-01-13 ENCOUNTER — Ambulatory Visit
Admission: RE | Admit: 2022-01-13 | Discharge: 2022-01-13 | Disposition: A | Discharge status: Home / Self Care | Payer: Medicare HMO | Source: Ambulatory Visit | Attending: Radiation Oncology | Admitting: Radiation Oncology

## 2022-01-13 DIAGNOSIS — Z170 Estrogen receptor positive status [ER+]: Secondary | ICD-10-CM | POA: Diagnosis not present

## 2022-01-13 DIAGNOSIS — M25552 Pain in left hip: Secondary | ICD-10-CM | POA: Diagnosis not present

## 2022-01-13 DIAGNOSIS — M25511 Pain in right shoulder: Secondary | ICD-10-CM | POA: Diagnosis not present

## 2022-01-13 DIAGNOSIS — Z510 Encounter for antineoplastic radiation therapy: Secondary | ICD-10-CM | POA: Diagnosis not present

## 2022-01-13 DIAGNOSIS — M25571 Pain in right ankle and joints of right foot: Secondary | ICD-10-CM | POA: Diagnosis not present

## 2022-01-13 DIAGNOSIS — Z79891 Long term (current) use of opiate analgesic: Secondary | ICD-10-CM | POA: Diagnosis not present

## 2022-01-13 DIAGNOSIS — M25562 Pain in left knee: Secondary | ICD-10-CM | POA: Diagnosis not present

## 2022-01-13 DIAGNOSIS — I10 Essential (primary) hypertension: Secondary | ICD-10-CM | POA: Diagnosis not present

## 2022-01-13 DIAGNOSIS — D0511 Intraductal carcinoma in situ of right breast: Secondary | ICD-10-CM | POA: Diagnosis not present

## 2022-01-13 DIAGNOSIS — M25551 Pain in right hip: Secondary | ICD-10-CM | POA: Diagnosis not present

## 2022-01-13 DIAGNOSIS — C50911 Malignant neoplasm of unspecified site of right female breast: Secondary | ICD-10-CM | POA: Diagnosis not present

## 2022-01-13 DIAGNOSIS — G894 Chronic pain syndrome: Secondary | ICD-10-CM | POA: Diagnosis not present

## 2022-01-13 LAB — RAD ONC ARIA SESSION SUMMARY
Course Elapsed Days: 28
Plan Fractions Treated to Date: 21
Plan Prescribed Dose Per Fraction: 1.8 Gy
Plan Total Fractions Prescribed: 28
Plan Total Prescribed Dose: 50.4 Gy
Reference Point Dosage Given to Date: 37.8 Gy
Reference Point Session Dosage Given: 1.8 Gy
Session Number: 21

## 2022-01-14 ENCOUNTER — Ambulatory Visit (INDEPENDENT_AMBULATORY_CARE_PROVIDER_SITE_OTHER): Payer: Medicare HMO | Admitting: Family Medicine

## 2022-01-14 ENCOUNTER — Ambulatory Visit
Admission: RE | Admit: 2022-01-14 | Discharge: 2022-01-14 | Disposition: A | Discharge status: Home / Self Care | Payer: Medicare HMO | Source: Ambulatory Visit | Attending: Radiation Oncology | Admitting: Radiation Oncology

## 2022-01-14 ENCOUNTER — Encounter: Payer: Self-pay | Admitting: Family Medicine

## 2022-01-14 ENCOUNTER — Other Ambulatory Visit: Payer: Self-pay

## 2022-01-14 VITALS — BP 128/66 | HR 80 | Ht 68.0 in | Wt 191.0 lb

## 2022-01-14 DIAGNOSIS — D0511 Intraductal carcinoma in situ of right breast: Secondary | ICD-10-CM | POA: Diagnosis not present

## 2022-01-14 DIAGNOSIS — I10 Essential (primary) hypertension: Secondary | ICD-10-CM | POA: Diagnosis not present

## 2022-01-14 DIAGNOSIS — Z170 Estrogen receptor positive status [ER+]: Secondary | ICD-10-CM | POA: Diagnosis not present

## 2022-01-14 DIAGNOSIS — C50911 Malignant neoplasm of unspecified site of right female breast: Secondary | ICD-10-CM | POA: Diagnosis not present

## 2022-01-14 DIAGNOSIS — Z510 Encounter for antineoplastic radiation therapy: Secondary | ICD-10-CM | POA: Diagnosis not present

## 2022-01-14 LAB — RAD ONC ARIA SESSION SUMMARY
Course Elapsed Days: 29
Plan Fractions Treated to Date: 22
Plan Prescribed Dose Per Fraction: 1.8 Gy
Plan Total Fractions Prescribed: 28
Plan Total Prescribed Dose: 50.4 Gy
Reference Point Dosage Given to Date: 39.6 Gy
Reference Point Session Dosage Given: 1.8 Gy
Session Number: 22

## 2022-01-14 MED ORDER — HYDROCHLOROTHIAZIDE 12.5 MG PO TABS: ORAL_TABLET | ORAL | 1 refills | Status: DC

## 2022-01-14 NOTE — Progress Notes (Signed)
Date:  01/14/2022   Name:  Kaitlyn Ali   DOB:  15-Aug-1955   MRN:  [id number]   Chief Complaint: Hypertension  Hypertension This is a chronic problem. The current episode started more than 1 year ago. The problem has been gradually improving since onset. The problem is controlled. Pertinent negatives include no chest pain, headaches, neck pain, palpitations or shortness of breath. There are no associated agents to hypertension. Risk factors for coronary artery disease include dyslipidemia. Past treatments include diuretics. The current treatment provides moderate improvement. There are no compliance problems.  There is no history of angina, kidney disease, CAD/MI, CVA, heart failure, left ventricular hypertrophy, PVD or retinopathy.    Lab Results  Component Value Date   NA 141 09/23/2021   K 4.3 09/23/2021   CO2 23 09/23/2021   GLUCOSE 111 (H) 09/23/2021   BUN 17 09/23/2021   CREATININE 1.10 (H) 09/23/2021   CALCIUM 10.6 (H) 09/23/2021   EGFR 56 (L) 09/23/2021   GFRNONAA 56 (L) 10/11/2017   Lab Results  Component Value Date   CHOL 234 (H) 07/15/2021   HDL 43 07/15/2021   LDLCALC 152 (H) 07/15/2021   TRIG 211 (H) 07/15/2021   CHOLHDL 5.6 (H) 02/16/2017   No results found for: "TSH" No results found for: "HGBA1C" Lab Results  Component Value Date   WBC 4.9 01/11/2022   HGB 14.8 01/11/2022   HCT 43.9 01/11/2022   MCV 92.4 01/11/2022   PLT 282 01/11/2022   Lab Results  Component Value Date   AST 17 10/11/2017   ALKPHOS 70 10/11/2017   BILITOT 0.4 10/11/2017   Lab Results  Component Value Date   25OHVITD2 <1.0 10/11/2017   25OHVITD3 26 10/11/2017     Review of Systems  Constitutional:  Negative for chills and fever.  HENT:  Negative for drooling, ear discharge, ear pain and sore throat.   Respiratory:  Negative for cough, shortness of breath and wheezing.   Cardiovascular:  Negative for chest pain, palpitations and leg swelling.  Gastrointestinal:   Negative for abdominal pain, blood in stool, constipation, diarrhea and nausea.  Endocrine: Negative for polydipsia.  Genitourinary:  Negative for dysuria, frequency, hematuria and urgency.  Musculoskeletal:  Negative for back pain, myalgias and neck pain.  Skin:  Negative for rash.  Allergic/Immunologic: Negative for environmental allergies.  Neurological:  Negative for dizziness and headaches.  Hematological:  Does not bruise/bleed easily.  Psychiatric/Behavioral:  Negative for suicidal ideas. The patient is not nervous/anxious.     Patient Active Problem List   Diagnosis Date Noted   Invasive ductal carcinoma of right breast ([place]) 11/11/2021   Family history of breast cancer 06/14/2021   Chronic pain of both knees (Secondary Area of Pain) (L>R) 10/11/2017   Chronic right shoulder pain (Primary Area of Pain) 10/11/2017   Chronic pain syndrome 10/11/2017   Long term current use of opiate analgesic 10/11/2017   Pharmacologic therapy 10/11/2017   Disorder of skeletal system 10/11/2017   Problems influencing health status 10/11/2017    Allergies  Allergen Reactions   Doxycycline Itching   Nsaids     Bothers stomach    Past Surgical History:  Procedure Laterality Date   BREAST BIOPSY Right 09/27/2021   UOQ mid lateral calcs, coil marker, DCIS   BREAST BIOPSY Right 09/27/2021   UOQ calcs anterior, X marker, DCIS   BREAST BIOPSY Left 09/27/2021   Distortion, ribbon marker, FIBROSIS AND USUAL DUCTAL HYPERPLASIA   BREAST LUMPECTOMY WITH  NEEDLE LOCALIZATION Right 10/20/2021   Procedure: BREAST LUMPECTOMY WITH NEEDLE LOCALIZATION;  Surgeon: Robert Bellow, MD;  Location: [facility] ORS;  Service: General;  Laterality: Right;   LAPAROSCOPIC HYSTERECTOMY     supracervical hyst due to AUB; Dr. Davis Gourd   TOE SURGERY     shaved bone in R) great toe   TONSILLECTOMY     wire placement Right 10/20/2021   bracking for lumpectomy    Social History   Tobacco Use   Smoking status:  Former    Packs/day: 0.25    Years: 15.00    Total pack years: 3.75    Types: Cigarettes    Quit date: 2013    Years since quitting: 10.7   Smokeless tobacco: Never   Tobacco comments:    Social smoker only  Vaping Use   Vaping Use: Never used  Substance Use Topics   Alcohol use: Not Currently   Drug use: No     Medication list has been reviewed and updated.  Current Meds  Medication Sig   diazepam (VALIUM) 5 MG tablet Take 5 mg by mouth 2 (two) times daily as needed for anxiety. Dr Toy Care   diphenhydrAMINE (BENADRYL) 25 mg capsule Take 25 mg by mouth every morning.   hydrochlorothiazide (HYDRODIURIL) 12.5 MG tablet TAKE 1 TABLET(12.5 MG) BY MOUTH DAILY   oxyCODONE-acetaminophen (PERCOCET) 10-325 MG per tablet Take 1 tablet by mouth 3 (three) times daily. Heag pain management       01/14/2022   11:09 AM 09/09/2021   10:33 AM 07/15/2021    8:53 AM  GAD 7 : Generalized Anxiety Score  Nervous, Anxious, on Edge 0 2 0  Control/stop worrying 0 1 0  Worry too much - different things 0 1 0  Trouble relaxing 0 1 0  Restless 0 0 0  Easily annoyed or irritable 0 0 0  Afraid - awful might happen 0 0 0  Total GAD 7 Score 0 5 0  Anxiety Difficulty Not difficult at all Not difficult at all Not difficult at all       01/14/2022   11:09 AM 09/09/2021   10:33 AM 07/15/2021    8:52 AM  Depression screen PHQ 2/9  Decreased Interest 0 1 0  Down, Depressed, Hopeless 0 0 0  PHQ - 2 Score 0 1 0  Altered sleeping 0 1 0  Tired, decreased energy 0 0 0  Change in appetite 0 1 0  Feeling bad or failure about yourself  0 0 0  Trouble concentrating 0 0 0  Moving slowly or fidgety/restless 0 1 0  Suicidal thoughts 0 0 0  PHQ-9 Score 0 4 0  Difficult doing work/chores Not difficult at all Not difficult at all Not difficult at all    BP Readings from Last 3 Encounters:  01/14/22 128/66  11/24/21 (!) 140/98  11/18/21 (!) 138/95    Physical Exam Vitals and nursing note reviewed. Exam  conducted with a chaperone present.  Constitutional:      General: She is not in acute distress.    Appearance: She is not diaphoretic.  HENT:     Head: Normocephalic and atraumatic.     Right Ear: External ear normal.     Left Ear: External ear normal.     Nose: Nose normal.  Eyes:     General:        Right eye: No discharge.        Left eye: No discharge.  Conjunctiva/sclera: Conjunctivae normal.     Pupils: Pupils are equal, round, and reactive to light.  Neck:     Thyroid: No thyromegaly.     Vascular: No JVD.  Cardiovascular:     Rate and Rhythm: Normal rate and regular rhythm.     Heart sounds: Normal heart sounds. No murmur heard.    No friction rub. No gallop.  Pulmonary:     Effort: Pulmonary effort is normal.     Breath sounds: Normal breath sounds.  Abdominal:     General: Bowel sounds are normal.     Palpations: Abdomen is soft. There is no mass.     Tenderness: There is no abdominal tenderness. There is no guarding.  Musculoskeletal:        General: Normal range of motion.     Cervical back: Normal range of motion and neck supple.  Lymphadenopathy:     Cervical: No cervical adenopathy.  Skin:    General: Skin is warm and dry.  Neurological:     Mental Status: She is alert.     Deep Tendon Reflexes: Reflexes are normal and symmetric.     Wt Readings from Last 3 Encounters:  01/14/22 191 lb (86.6 kg)  11/24/21 196 lb 1.6 oz (89 kg)  11/18/21 194 lb (88 kg)    BP 128/66   Pulse 80   Ht '5\' 8"'  (1.727 m)   Wt 191 lb (86.6 kg)   BMI 29.04 kg/m   Assessment and Plan: 1. Primary hypertension Chronic.  Controlled.  Stable.  Blood pressure is 128/66.  Continue hydrochlorothiazide 12.5 mg once a day.  We will recheck in 6 months.  Patient is doing well with radiation therapy for intraductal carcinoma and I have given her encouragement and to return on an as-needed basis particularly if her blood pressure starts increasing. - hydrochlorothiazide  (HYDRODIURIL) 12.5 MG tablet; TAKE 1 TABLET(12.5 MG) BY MOUTH DAILY  Dispense: 90 tablet; Refill: 1     Otilio Miu, MD

## 2022-01-17 ENCOUNTER — Ambulatory Visit
Admission: RE | Admit: 2022-01-17 | Discharge: 2022-01-17 | Disposition: A | Discharge status: Home / Self Care | Payer: Medicare HMO | Source: Ambulatory Visit | Attending: Radiation Oncology | Admitting: Radiation Oncology

## 2022-01-17 ENCOUNTER — Other Ambulatory Visit: Payer: Self-pay

## 2022-01-17 DIAGNOSIS — Z510 Encounter for antineoplastic radiation therapy: Secondary | ICD-10-CM | POA: Diagnosis not present

## 2022-01-17 DIAGNOSIS — C50911 Malignant neoplasm of unspecified site of right female breast: Secondary | ICD-10-CM | POA: Diagnosis not present

## 2022-01-17 DIAGNOSIS — D0511 Intraductal carcinoma in situ of right breast: Secondary | ICD-10-CM | POA: Diagnosis not present

## 2022-01-17 DIAGNOSIS — Z170 Estrogen receptor positive status [ER+]: Secondary | ICD-10-CM | POA: Diagnosis not present

## 2022-01-17 LAB — RAD ONC ARIA SESSION SUMMARY
Course Elapsed Days: 32
Plan Fractions Treated to Date: 23
Plan Prescribed Dose Per Fraction: 1.8 Gy
Plan Total Fractions Prescribed: 28
Plan Total Prescribed Dose: 50.4 Gy
Reference Point Dosage Given to Date: 41.4 Gy
Reference Point Session Dosage Given: 1.8 Gy
Session Number: 23

## 2022-01-18 ENCOUNTER — Ambulatory Visit
Admission: RE | Admit: 2022-01-18 | Discharge: 2022-01-18 | Disposition: A | Discharge status: Home / Self Care | Payer: Medicare HMO | Source: Ambulatory Visit | Attending: Radiation Oncology | Admitting: Radiation Oncology

## 2022-01-18 ENCOUNTER — Other Ambulatory Visit: Payer: Self-pay

## 2022-01-18 DIAGNOSIS — D0511 Intraductal carcinoma in situ of right breast: Secondary | ICD-10-CM | POA: Diagnosis not present

## 2022-01-18 DIAGNOSIS — Z170 Estrogen receptor positive status [ER+]: Secondary | ICD-10-CM | POA: Diagnosis not present

## 2022-01-18 DIAGNOSIS — Z510 Encounter for antineoplastic radiation therapy: Secondary | ICD-10-CM | POA: Diagnosis not present

## 2022-01-18 DIAGNOSIS — C50911 Malignant neoplasm of unspecified site of right female breast: Secondary | ICD-10-CM | POA: Diagnosis not present

## 2022-01-18 LAB — RAD ONC ARIA SESSION SUMMARY
Course Elapsed Days: 33
Plan Fractions Treated to Date: 24
Plan Prescribed Dose Per Fraction: 1.8 Gy
Plan Total Fractions Prescribed: 28
Plan Total Prescribed Dose: 50.4 Gy
Reference Point Dosage Given to Date: 43.2 Gy
Reference Point Session Dosage Given: 1.8 Gy
Session Number: 24

## 2022-01-19 ENCOUNTER — Ambulatory Visit: Payer: Medicare HMO

## 2022-01-19 ENCOUNTER — Other Ambulatory Visit: Payer: Self-pay

## 2022-01-19 DIAGNOSIS — Z170 Estrogen receptor positive status [ER+]: Secondary | ICD-10-CM | POA: Diagnosis not present

## 2022-01-19 DIAGNOSIS — C50911 Malignant neoplasm of unspecified site of right female breast: Secondary | ICD-10-CM | POA: Diagnosis not present

## 2022-01-19 DIAGNOSIS — Z510 Encounter for antineoplastic radiation therapy: Secondary | ICD-10-CM | POA: Diagnosis not present

## 2022-01-19 LAB — RAD ONC ARIA SESSION SUMMARY
Course Elapsed Days: 34
Plan Fractions Treated to Date: 1
Plan Prescribed Dose Per Fraction: 2 Gy
Plan Total Fractions Prescribed: 8
Plan Total Prescribed Dose: 16 Gy
Reference Point Dosage Given to Date: 2 Gy
Reference Point Session Dosage Given: 2 Gy
Session Number: 25

## 2022-01-20 ENCOUNTER — Ambulatory Visit: Payer: Medicare HMO

## 2022-01-20 ENCOUNTER — Other Ambulatory Visit: Payer: Self-pay

## 2022-01-20 DIAGNOSIS — D0511 Intraductal carcinoma in situ of right breast: Secondary | ICD-10-CM | POA: Diagnosis not present

## 2022-01-20 DIAGNOSIS — Z510 Encounter for antineoplastic radiation therapy: Secondary | ICD-10-CM | POA: Diagnosis not present

## 2022-01-20 DIAGNOSIS — Z170 Estrogen receptor positive status [ER+]: Secondary | ICD-10-CM | POA: Diagnosis not present

## 2022-01-20 DIAGNOSIS — C50911 Malignant neoplasm of unspecified site of right female breast: Secondary | ICD-10-CM | POA: Diagnosis not present

## 2022-01-20 LAB — RAD ONC ARIA SESSION SUMMARY
Course Elapsed Days: 35
Plan Fractions Treated to Date: 2
Plan Prescribed Dose Per Fraction: 2 Gy
Plan Total Fractions Prescribed: 8
Plan Total Prescribed Dose: 16 Gy
Reference Point Dosage Given to Date: 4 Gy
Reference Point Session Dosage Given: 2 Gy
Session Number: 26

## 2022-01-21 ENCOUNTER — Other Ambulatory Visit: Payer: Self-pay | Admitting: *Deleted

## 2022-01-21 ENCOUNTER — Other Ambulatory Visit: Payer: Self-pay

## 2022-01-21 ENCOUNTER — Ambulatory Visit: Payer: Medicare HMO

## 2022-01-21 ENCOUNTER — Ambulatory Visit
Admission: RE | Admit: 2022-01-21 | Discharge: 2022-01-21 | Disposition: A | Discharge status: Home / Self Care | Payer: Medicare HMO | Source: Ambulatory Visit | Attending: Radiation Oncology | Admitting: Radiation Oncology

## 2022-01-21 DIAGNOSIS — Z510 Encounter for antineoplastic radiation therapy: Secondary | ICD-10-CM | POA: Diagnosis not present

## 2022-01-21 DIAGNOSIS — Z170 Estrogen receptor positive status [ER+]: Secondary | ICD-10-CM | POA: Diagnosis not present

## 2022-01-21 DIAGNOSIS — C50911 Malignant neoplasm of unspecified site of right female breast: Secondary | ICD-10-CM | POA: Diagnosis not present

## 2022-01-21 LAB — RAD ONC ARIA SESSION SUMMARY
Course Elapsed Days: 36
Plan Fractions Treated to Date: 3
Plan Prescribed Dose Per Fraction: 2 Gy
Plan Total Fractions Prescribed: 8
Plan Total Prescribed Dose: 16 Gy
Reference Point Dosage Given to Date: 6 Gy
Reference Point Session Dosage Given: 2 Gy
Session Number: 27

## 2022-01-21 MED ORDER — STRATA XRT EX GEL: 1.0000 | Freq: Three times a day (TID) | CUTANEOUS | 0 refills | Status: DC | PRN

## 2022-01-24 ENCOUNTER — Other Ambulatory Visit: Payer: Self-pay

## 2022-01-24 ENCOUNTER — Ambulatory Visit: Payer: Medicare HMO

## 2022-01-24 DIAGNOSIS — Z170 Estrogen receptor positive status [ER+]: Secondary | ICD-10-CM | POA: Diagnosis not present

## 2022-01-24 DIAGNOSIS — Z510 Encounter for antineoplastic radiation therapy: Secondary | ICD-10-CM | POA: Diagnosis not present

## 2022-01-24 DIAGNOSIS — C50911 Malignant neoplasm of unspecified site of right female breast: Secondary | ICD-10-CM | POA: Diagnosis not present

## 2022-01-24 LAB — RAD ONC ARIA SESSION SUMMARY
Course Elapsed Days: 39
Plan Fractions Treated to Date: 4
Plan Prescribed Dose Per Fraction: 2 Gy
Plan Total Fractions Prescribed: 8
Plan Total Prescribed Dose: 16 Gy
Reference Point Dosage Given to Date: 8 Gy
Reference Point Session Dosage Given: 2 Gy
Session Number: 28

## 2022-01-25 ENCOUNTER — Ambulatory Visit: Payer: Medicare HMO

## 2022-01-25 ENCOUNTER — Other Ambulatory Visit: Payer: Self-pay

## 2022-01-25 ENCOUNTER — Inpatient Hospital Stay: Payer: Medicare HMO

## 2022-01-25 DIAGNOSIS — Z170 Estrogen receptor positive status [ER+]: Secondary | ICD-10-CM | POA: Diagnosis not present

## 2022-01-25 DIAGNOSIS — D0511 Intraductal carcinoma in situ of right breast: Secondary | ICD-10-CM

## 2022-01-25 DIAGNOSIS — Z510 Encounter for antineoplastic radiation therapy: Secondary | ICD-10-CM | POA: Diagnosis not present

## 2022-01-25 DIAGNOSIS — C50911 Malignant neoplasm of unspecified site of right female breast: Secondary | ICD-10-CM | POA: Diagnosis not present

## 2022-01-25 LAB — RAD ONC ARIA SESSION SUMMARY
Course Elapsed Days: 40
Plan Fractions Treated to Date: 5
Plan Prescribed Dose Per Fraction: 2 Gy
Plan Total Fractions Prescribed: 8
Plan Total Prescribed Dose: 16 Gy
Reference Point Dosage Given to Date: 10 Gy
Reference Point Session Dosage Given: 2 Gy
Session Number: 29

## 2022-01-25 LAB — CBC
HCT: 45 % (ref 36.0–46.0)
Hemoglobin: 15 g/dL (ref 12.0–15.0)
MCH: 31.3 pg (ref 26.0–34.0)
MCHC: 33.3 g/dL (ref 30.0–36.0)
MCV: 93.9 fL (ref 80.0–100.0)
Platelets: 259 10*3/uL (ref 150–400)
RBC: 4.79 MIL/uL (ref 3.87–5.11)
RDW: 13.5 % (ref 11.5–15.5)
WBC: 5.3 10*3/uL (ref 4.0–10.5)
nRBC: 0 % (ref 0.0–0.2)

## 2022-01-26 ENCOUNTER — Ambulatory Visit: Payer: Medicare HMO

## 2022-01-26 ENCOUNTER — Other Ambulatory Visit: Payer: Self-pay

## 2022-01-26 DIAGNOSIS — Z510 Encounter for antineoplastic radiation therapy: Secondary | ICD-10-CM | POA: Diagnosis not present

## 2022-01-26 DIAGNOSIS — Z170 Estrogen receptor positive status [ER+]: Secondary | ICD-10-CM | POA: Diagnosis not present

## 2022-01-26 DIAGNOSIS — C50911 Malignant neoplasm of unspecified site of right female breast: Secondary | ICD-10-CM | POA: Diagnosis not present

## 2022-01-26 LAB — RAD ONC ARIA SESSION SUMMARY
Course Elapsed Days: 41
Plan Fractions Treated to Date: 6
Plan Prescribed Dose Per Fraction: 2 Gy
Plan Total Fractions Prescribed: 8
Plan Total Prescribed Dose: 16 Gy
Reference Point Dosage Given to Date: 12 Gy
Reference Point Session Dosage Given: 2 Gy
Session Number: 30

## 2022-01-27 ENCOUNTER — Ambulatory Visit
Admission: RE | Admit: 2022-01-27 | Discharge: 2022-01-27 | Disposition: A | Discharge status: Home / Self Care | Payer: Medicare HMO | Source: Ambulatory Visit | Attending: Radiation Oncology | Admitting: Radiation Oncology

## 2022-01-27 ENCOUNTER — Ambulatory Visit: Payer: Medicare HMO

## 2022-01-27 ENCOUNTER — Other Ambulatory Visit: Payer: Self-pay

## 2022-01-27 DIAGNOSIS — Z170 Estrogen receptor positive status [ER+]: Secondary | ICD-10-CM | POA: Diagnosis not present

## 2022-01-27 DIAGNOSIS — D0511 Intraductal carcinoma in situ of right breast: Secondary | ICD-10-CM | POA: Diagnosis not present

## 2022-01-27 DIAGNOSIS — Z510 Encounter for antineoplastic radiation therapy: Secondary | ICD-10-CM | POA: Diagnosis not present

## 2022-01-27 DIAGNOSIS — C50911 Malignant neoplasm of unspecified site of right female breast: Secondary | ICD-10-CM | POA: Diagnosis not present

## 2022-01-27 LAB — RAD ONC ARIA SESSION SUMMARY
Course Elapsed Days: 42
Plan Fractions Treated to Date: 7
Plan Prescribed Dose Per Fraction: 2 Gy
Plan Total Fractions Prescribed: 8
Plan Total Prescribed Dose: 16 Gy
Reference Point Dosage Given to Date: 14 Gy
Reference Point Session Dosage Given: 2 Gy
Session Number: 31

## 2022-01-28 ENCOUNTER — Encounter: Payer: Self-pay | Admitting: *Deleted

## 2022-01-28 ENCOUNTER — Ambulatory Visit: Payer: Medicare HMO

## 2022-01-28 ENCOUNTER — Ambulatory Visit
Admission: RE | Admit: 2022-01-28 | Discharge: 2022-01-28 | Disposition: A | Discharge status: Home / Self Care | Payer: Medicare HMO | Source: Ambulatory Visit | Attending: Radiation Oncology | Admitting: Radiation Oncology

## 2022-01-28 ENCOUNTER — Other Ambulatory Visit: Payer: Self-pay

## 2022-01-28 DIAGNOSIS — Z170 Estrogen receptor positive status [ER+]: Secondary | ICD-10-CM | POA: Diagnosis not present

## 2022-01-28 DIAGNOSIS — C50911 Malignant neoplasm of unspecified site of right female breast: Secondary | ICD-10-CM | POA: Diagnosis not present

## 2022-01-28 DIAGNOSIS — Z510 Encounter for antineoplastic radiation therapy: Secondary | ICD-10-CM | POA: Diagnosis not present

## 2022-01-28 LAB — RAD ONC ARIA SESSION SUMMARY
Course Elapsed Days: 43
Plan Fractions Treated to Date: 8
Plan Prescribed Dose Per Fraction: 2 Gy
Plan Total Fractions Prescribed: 8
Plan Total Prescribed Dose: 16 Gy
Reference Point Dosage Given to Date: 16 Gy
Reference Point Session Dosage Given: 2 Gy
Session Number: 32

## 2022-01-28 NOTE — Progress Notes (Signed)
Met with Ms. Martinique after radiation today.  She is doing well, finishes next week.   Follow up appt. Given for Dr. Grayland Ormond.

## 2022-01-31 ENCOUNTER — Other Ambulatory Visit: Payer: Self-pay

## 2022-01-31 ENCOUNTER — Ambulatory Visit: Payer: Medicare HMO

## 2022-01-31 DIAGNOSIS — C50911 Malignant neoplasm of unspecified site of right female breast: Secondary | ICD-10-CM | POA: Diagnosis not present

## 2022-01-31 DIAGNOSIS — D0511 Intraductal carcinoma in situ of right breast: Secondary | ICD-10-CM | POA: Diagnosis not present

## 2022-01-31 DIAGNOSIS — Z510 Encounter for antineoplastic radiation therapy: Secondary | ICD-10-CM | POA: Diagnosis not present

## 2022-01-31 DIAGNOSIS — Z170 Estrogen receptor positive status [ER+]: Secondary | ICD-10-CM | POA: Diagnosis not present

## 2022-01-31 LAB — RAD ONC ARIA SESSION SUMMARY
Course Elapsed Days: 46
Plan Fractions Treated to Date: 25
Plan Prescribed Dose Per Fraction: 1.8 Gy
Plan Total Fractions Prescribed: 28
Plan Total Prescribed Dose: 50.4 Gy
Reference Point Dosage Given to Date: 45 Gy
Reference Point Session Dosage Given: 1.8 Gy
Session Number: 33

## 2022-02-01 ENCOUNTER — Ambulatory Visit: Payer: Medicare HMO

## 2022-02-01 ENCOUNTER — Other Ambulatory Visit: Payer: Self-pay

## 2022-02-01 DIAGNOSIS — D0511 Intraductal carcinoma in situ of right breast: Secondary | ICD-10-CM | POA: Diagnosis not present

## 2022-02-01 DIAGNOSIS — C50911 Malignant neoplasm of unspecified site of right female breast: Secondary | ICD-10-CM | POA: Diagnosis not present

## 2022-02-01 DIAGNOSIS — Z510 Encounter for antineoplastic radiation therapy: Secondary | ICD-10-CM | POA: Diagnosis not present

## 2022-02-01 DIAGNOSIS — Z170 Estrogen receptor positive status [ER+]: Secondary | ICD-10-CM | POA: Diagnosis not present

## 2022-02-01 LAB — RAD ONC ARIA SESSION SUMMARY
Course Elapsed Days: 47
Plan Fractions Treated to Date: 26
Plan Prescribed Dose Per Fraction: 1.8 Gy
Plan Total Fractions Prescribed: 28
Plan Total Prescribed Dose: 50.4 Gy
Reference Point Dosage Given to Date: 46.8 Gy
Reference Point Session Dosage Given: 1.8 Gy
Session Number: 34

## 2022-02-02 ENCOUNTER — Ambulatory Visit: Payer: Medicare HMO

## 2022-02-02 ENCOUNTER — Other Ambulatory Visit: Payer: Self-pay

## 2022-02-02 ENCOUNTER — Ambulatory Visit
Admission: RE | Admit: 2022-02-02 | Discharge: 2022-02-02 | Disposition: A | Discharge status: Home / Self Care | Payer: Medicare HMO | Source: Ambulatory Visit | Attending: Oncology | Admitting: Oncology

## 2022-02-02 DIAGNOSIS — Z170 Estrogen receptor positive status [ER+]: Secondary | ICD-10-CM | POA: Diagnosis not present

## 2022-02-02 DIAGNOSIS — Z510 Encounter for antineoplastic radiation therapy: Secondary | ICD-10-CM | POA: Insufficient documentation

## 2022-02-02 DIAGNOSIS — C50911 Malignant neoplasm of unspecified site of right female breast: Secondary | ICD-10-CM | POA: Insufficient documentation

## 2022-02-02 DIAGNOSIS — D0511 Intraductal carcinoma in situ of right breast: Secondary | ICD-10-CM | POA: Diagnosis not present

## 2022-02-02 LAB — RAD ONC ARIA SESSION SUMMARY
Course Elapsed Days: 48
Plan Fractions Treated to Date: 27
Plan Prescribed Dose Per Fraction: 1.8 Gy
Plan Total Fractions Prescribed: 28
Plan Total Prescribed Dose: 50.4 Gy
Reference Point Dosage Given to Date: 48.6 Gy
Reference Point Session Dosage Given: 1.8 Gy
Session Number: 35

## 2022-02-03 ENCOUNTER — Other Ambulatory Visit: Payer: Self-pay

## 2022-02-03 ENCOUNTER — Ambulatory Visit: Payer: Medicare HMO

## 2022-02-03 ENCOUNTER — Ambulatory Visit
Admission: RE | Admit: 2022-02-03 | Discharge: 2022-02-03 | Disposition: A | Discharge status: Home / Self Care | Payer: Medicare HMO | Source: Ambulatory Visit | Attending: Radiation Oncology | Admitting: Radiation Oncology

## 2022-02-03 DIAGNOSIS — Z510 Encounter for antineoplastic radiation therapy: Secondary | ICD-10-CM | POA: Diagnosis not present

## 2022-02-03 DIAGNOSIS — Z170 Estrogen receptor positive status [ER+]: Secondary | ICD-10-CM | POA: Diagnosis not present

## 2022-02-03 DIAGNOSIS — D0511 Intraductal carcinoma in situ of right breast: Secondary | ICD-10-CM | POA: Diagnosis not present

## 2022-02-03 DIAGNOSIS — C50911 Malignant neoplasm of unspecified site of right female breast: Secondary | ICD-10-CM | POA: Diagnosis not present

## 2022-02-03 LAB — RAD ONC ARIA SESSION SUMMARY
Course Elapsed Days: 49
Plan Fractions Treated to Date: 28
Plan Prescribed Dose Per Fraction: 1.8 Gy
Plan Total Fractions Prescribed: 28
Plan Total Prescribed Dose: 50.4 Gy
Reference Point Dosage Given to Date: 50.4 Gy
Reference Point Session Dosage Given: 1.8 Gy
Session Number: 36

## 2022-02-03 NOTE — Progress Notes (Deleted)
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  INTERVAL HISTORY: Patient returns to clinic today for further evaluation and further discussion of treatment recommendations.  She continues to be highly anxious, but otherwise feels well.  She has no neurologic complaints.  She denies any recent fevers or illnesses.  She has a good appetite and denies weight loss.  She has no chest pain, shortness of breath, cough, or hemoptysis.  She denies any nausea, vomiting, constipation, diarrhea.  She has no urinary complaints.  Patient offers no further specific complaints today.  REVIEW OF SYSTEMS:   Review of Systems  Constitutional: Negative.  Negative for fever, malaise/fatigue and weight loss.  Respiratory: Negative.  Negative for cough, hemoptysis and shortness of breath.   Cardiovascular: Negative.  Negative for chest pain and leg swelling.  Gastrointestinal: Negative.  Negative for abdominal pain.  Genitourinary: Negative.  Negative for dysuria.  Musculoskeletal: Negative.  Negative for back pain.  Skin: Negative.  Negative for rash.  Neurological: Negative.  Negative for dizziness, focal weakness, weakness and headaches.  Psychiatric/Behavioral:  The patient is nervous/anxious.     As per HPI. Otherwise, a complete review of systems is negative.  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   RT breast   Depression    GERD (gastroesophageal reflux disease)    h/o with pregnancy   Hypertension    Panic attack     PAST SURGICAL HISTORY: Past Surgical History:   Procedure Laterality Date   BREAST BIOPSY Right 09/27/2021   UOQ mid lateral calcs, coil marker, DCIS   BREAST BIOPSY Right 09/27/2021   UOQ calcs anterior, X marker, DCIS   BREAST BIOPSY Left 09/27/2021   Distortion, ribbon marker, FIBROSIS AND USUAL DUCTAL HYPERPLASIA   BREAST LUMPECTOMY WITH NEEDLE LOCALIZATION Right 10/20/2021   Procedure: BREAST LUMPECTOMY WITH NEEDLE LOCALIZATION;  Surgeon: Robert Bellow, MD;  Location: [facility] ORS;  Service: General;  Laterality: Right;   LAPAROSCOPIC HYSTERECTOMY     supracervical hyst due to AUB; Dr. Davis Gourd   TOE SURGERY     shaved bone in R) great toe   TONSILLECTOMY     wire placement Right 10/20/2021   bracking for lumpectomy    FAMILY HISTORY: Family History  Problem Relation Age of Onset   Diabetes Mother    Liver cancer Father 23   Breast cancer Sister 15       NEG BRCA testing   Brain cancer Paternal Uncle    Leukemia Maternal Grandfather    Breast cancer Cousin        dx 30s-40s    ADVANCED DIRECTIVES (Y/N):  N  HEALTH MAINTENANCE: Social History   Tobacco Use   Smoking status: Former    Packs/day: 0.25    Years: 15.00    Total pack years: 3.75    Types: Cigarettes    Quit date: 2013    Years since quitting: 10.8   Smokeless tobacco: Never   Tobacco comments:    Social smoker only  Vaping Use   Vaping Use: Never used  Substance  Use Topics   Alcohol use: Not Currently   Drug use: No     Colonoscopy:  PAP:  Bone density:  Lipid panel:  Allergies  Allergen Reactions   Doxycycline Itching   Nsaids     Bothers stomach    Current Outpatient Medications  Medication Sig Dispense Refill   Dermatological Products, Misc. (STRATA XRT) GEL Apply 1 Application topically 3 (three) times daily as needed. 50 g 0   diazepam (VALIUM) 5 MG tablet Take 5 mg by mouth 2 (two) times daily as needed for anxiety. Dr Toy Care  1   diphenhydrAMINE (BENADRYL) 25 mg capsule Take 25 mg by mouth every morning.      hydrochlorothiazide (HYDRODIURIL) 12.5 MG tablet TAKE 1 TABLET(12.5 MG) BY MOUTH DAILY 90 tablet 1   oxyCODONE-acetaminophen (PERCOCET) 10-325 MG per tablet Take 1 tablet by mouth 3 (three) times daily. [facility]ag pain management  0   triamcinolone cream (KENALOG) 0.1 % Apply 1 application. topically 2 (two) times daily. (Patient not taking: Reported on 01/14/2022) 30 g 0   No current facility-administered medications for this visit.    OBJECTIVE: There were no vitals filed for this visit.     There is no height or weight on file to calculate BMI.    ECOG FS:0 - Asymptomatic  General: Well-developed, well-nourished, no acute distress. Eyes: Pink conjunctiva, anicteric sclera. HEENT: Normocephalic, moist mucous membranes. Lungs: No audible wheezing or coughing. Heart: Regular rate and rhythm. Abdomen: Soft, nontender, no obvious distention. Musculoskeletal: No edema, cyanosis, or clubbing. Neuro: Alert, answering all questions appropriately. Cranial nerves grossly intact. Skin: No rashes or petechiae noted. Psych: Normal affect.  LAB RESULTS:  Lab Results  Component Value Date   NA 141 09/23/2021   K 4.3 09/23/2021   CL 99 09/23/2021   CO2 23 09/23/2021   GLUCOSE 111 (H) 09/23/2021   BUN 17 09/23/2021   CREATININE 1.10 (H) 09/23/2021   CALCIUM 10.6 (H) 09/23/2021   PROT 7.5 10/11/2017   ALBUMIN 4.9 (H) 09/23/2021   AST 17 10/11/2017   ALKPHOS 70 10/11/2017   BILITOT 0.4 10/11/2017   GFRNONAA 56 (L) 10/11/2017   GFRAA 65 10/11/2017    Lab Results  Component Value Date   WBC 5.3 01/25/2022   NEUTROABS 5.4 09/23/2021   HGB 15.0 01/25/2022   HCT 45.0 01/25/2022   MCV 93.9 01/25/2022   PLT 259 01/25/2022     STUDIES: No results found.  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  PLAN:    Stage Ia invasive triple positive carcinoma of the right breast: Given positive margins and the high-grade nature of her DCIS, agree with surgery  and recommend reexcision.  We once again discussed at length the benefits of reexcision and sentinel node biopsy, adjuvant chemotherapy with Taxol and Herceptin, adjuvant XRT, and treatment with aromatase inhibitor.  Patient expressed understanding but not undergoing the recommended treatment increases her risk of recurrence.  She continues to remain unclear of what she wants to do next.  Plan is to follow-up with nurse navigator by phone and make a final decision within the next week.  No follow-up has been scheduled at this time will be based on patient's treatment decisions.   I spent a total of 30 minutes reviewing chart data, face-to-face evaluation with the patient, counseling and coordination of care as detailed above.   Patient expressed understanding and was in agreement with this plan. She also understands that She can call clinic at  any time with any questions, concerns, or complaints.    Cancer Staging  Invasive ductal carcinoma of right breast [facility]) Staging form: Breast, AJCC 8th Edition - Pathologic stage from 11/11/2021: Stage IA (pT1b, pN0, cM0, G3, ER+, PR+, HER2+) - Signed by Lloyd Huger, MD on 11/11/2021 Stage prefix: Initial diagnosis Histologic grading system: 3 grade system   Lloyd Huger, MD   02/03/2022 7:09 AM

## 2022-02-04 ENCOUNTER — Encounter: Payer: Self-pay | Admitting: *Deleted

## 2022-02-04 ENCOUNTER — Inpatient Hospital Stay: Payer: Medicare HMO | Admitting: Oncology

## 2022-02-04 DIAGNOSIS — C50911 Malignant neoplasm of unspecified site of right female breast: Secondary | ICD-10-CM

## 2022-02-04 NOTE — Progress Notes (Signed)
Called patient to see if she would like to r/s her appt. With Dr. Grayland Ormond.   Had to leave a voicemail asking for return call.

## 2022-02-10 DIAGNOSIS — G894 Chronic pain syndrome: Secondary | ICD-10-CM | POA: Diagnosis not present

## 2022-02-10 DIAGNOSIS — M25552 Pain in left hip: Secondary | ICD-10-CM | POA: Diagnosis not present

## 2022-02-10 DIAGNOSIS — M25551 Pain in right hip: Secondary | ICD-10-CM | POA: Diagnosis not present

## 2022-02-10 DIAGNOSIS — M25562 Pain in left knee: Secondary | ICD-10-CM | POA: Diagnosis not present

## 2022-02-10 DIAGNOSIS — M25511 Pain in right shoulder: Secondary | ICD-10-CM | POA: Diagnosis not present

## 2022-02-10 DIAGNOSIS — M25571 Pain in right ankle and joints of right foot: Secondary | ICD-10-CM | POA: Diagnosis not present

## 2022-02-10 DIAGNOSIS — Z79891 Long term (current) use of opiate analgesic: Secondary | ICD-10-CM | POA: Diagnosis not present

## 2022-02-21 DIAGNOSIS — H0014 Chalazion left upper eyelid: Secondary | ICD-10-CM | POA: Diagnosis not present

## 2022-03-07 ENCOUNTER — Other Ambulatory Visit: Payer: Self-pay | Admitting: *Deleted

## 2022-03-07 ENCOUNTER — Ambulatory Visit
Admission: RE | Admit: 2022-03-07 | Discharge: 2022-03-07 | Disposition: A | Discharge status: Home / Self Care | Payer: Medicare HMO | Source: Ambulatory Visit | Attending: Radiation Oncology | Admitting: Radiation Oncology

## 2022-03-07 VITALS — BP 159/98 | HR 86 | Temp 97.0°F | Ht 68.0 in | Wt 197.0 lb

## 2022-03-07 DIAGNOSIS — Z170 Estrogen receptor positive status [ER+]: Secondary | ICD-10-CM | POA: Insufficient documentation

## 2022-03-07 DIAGNOSIS — C50811 Malignant neoplasm of overlapping sites of right female breast: Secondary | ICD-10-CM

## 2022-03-07 DIAGNOSIS — D0511 Intraductal carcinoma in situ of right breast: Secondary | ICD-10-CM | POA: Insufficient documentation

## 2022-03-07 DIAGNOSIS — Z923 Personal history of irradiation: Secondary | ICD-10-CM | POA: Diagnosis not present

## 2022-03-07 NOTE — Progress Notes (Signed)
Radiation Oncology Follow up Note  Name: Kaitlyn Ali   Date:   03/07/2022 MRN:  [id number] DOB: 04-12-1955    This 66 y.o. 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  She has declined chemotherapy.  She is seen today in follow-up and doing well.  She specifically denies breast tenderness cough or bone pain..  COMPLICATIONS OF TREATMENT: none  FOLLOW UP COMPLIANCE: keeps appointments   PHYSICAL EXAM:  BP (!) 159/98   Pulse 86   Temp (!) 97 F (36.1 C)   Ht '5\' 8"'$  (1.727 m)   Wt 197 lb (89.4 kg)   BMI 29.95 kg/m  Lungs are clear to A&P cardiac examination essentially unremarkable with regular rate and rhythm. No dominant mass or nodularity is noted in either breast in 2 positions examined. Incision is well-healed. No axillary or supraclavicular adenopathy is appreciated. Cosmetic result is excellent.  Well-developed well-nourished patient in NAD. HEENT reveals PERLA, EOMI, discs not visualized.  Oral cavity is clear. No oral mucosal lesions are identified. Neck is clear without evidence of cervical or supraclavicular adenopathy. Lungs are clear to A&P. Cardiac examination is essentially unremarkable with regular rate and rhythm without murmur rub or thrill. Abdomen is benign with no organomegaly or masses noted. Motor sensory and DTR levels are equal and symmetric in the upper and lower extremities. Cranial nerves II through XII are grossly intact. Proprioception is intact. No peripheral adenopathy or edema is identified. No motor or sensory levels are noted. Crude visual fields are within normal  range.  RADIOLOGY RESULTS: Mammograms in 6 months have been ordered  PLAN: I had a discussion today about seeing another medical oncology although patient has declined.  Endocrine therapy also will be consideration.  I have asked to see her back in 6 months for follow-up.  Patient knows to call with any concerns.  I would like to take this opportunity to thank you for allowing me to participate in the care of your patient.Noreene Filbert, MD

## 2022-03-11 DIAGNOSIS — M25551 Pain in right hip: Secondary | ICD-10-CM | POA: Diagnosis not present

## 2022-03-11 DIAGNOSIS — M25511 Pain in right shoulder: Secondary | ICD-10-CM | POA: Diagnosis not present

## 2022-03-11 DIAGNOSIS — Z79891 Long term (current) use of opiate analgesic: Secondary | ICD-10-CM | POA: Diagnosis not present

## 2022-03-11 DIAGNOSIS — M25552 Pain in left hip: Secondary | ICD-10-CM | POA: Diagnosis not present

## 2022-03-11 DIAGNOSIS — G894 Chronic pain syndrome: Secondary | ICD-10-CM | POA: Diagnosis not present

## 2022-03-11 DIAGNOSIS — M25571 Pain in right ankle and joints of right foot: Secondary | ICD-10-CM | POA: Diagnosis not present

## 2022-03-11 DIAGNOSIS — M25562 Pain in left knee: Secondary | ICD-10-CM | POA: Diagnosis not present

## 2022-04-08 DIAGNOSIS — M25551 Pain in right hip: Secondary | ICD-10-CM | POA: Diagnosis not present

## 2022-04-08 DIAGNOSIS — M25552 Pain in left hip: Secondary | ICD-10-CM | POA: Diagnosis not present

## 2022-04-08 DIAGNOSIS — Z79891 Long term (current) use of opiate analgesic: Secondary | ICD-10-CM | POA: Diagnosis not present

## 2022-04-08 DIAGNOSIS — M25562 Pain in left knee: Secondary | ICD-10-CM | POA: Diagnosis not present

## 2022-04-08 DIAGNOSIS — G894 Chronic pain syndrome: Secondary | ICD-10-CM | POA: Diagnosis not present

## 2022-04-08 DIAGNOSIS — M25511 Pain in right shoulder: Secondary | ICD-10-CM | POA: Diagnosis not present

## 2022-04-08 DIAGNOSIS — M25571 Pain in right ankle and joints of right foot: Secondary | ICD-10-CM | POA: Diagnosis not present

## 2022-04-22 DIAGNOSIS — G720 Drug-induced myopathy: Secondary | ICD-10-CM | POA: Insufficient documentation

## 2022-05-06 DIAGNOSIS — M25511 Pain in right shoulder: Secondary | ICD-10-CM | POA: Diagnosis not present

## 2022-05-06 DIAGNOSIS — G894 Chronic pain syndrome: Secondary | ICD-10-CM | POA: Diagnosis not present

## 2022-05-06 DIAGNOSIS — M25552 Pain in left hip: Secondary | ICD-10-CM | POA: Diagnosis not present

## 2022-05-06 DIAGNOSIS — M25571 Pain in right ankle and joints of right foot: Secondary | ICD-10-CM | POA: Diagnosis not present

## 2022-05-06 DIAGNOSIS — M25551 Pain in right hip: Secondary | ICD-10-CM | POA: Diagnosis not present

## 2022-05-06 DIAGNOSIS — Z79891 Long term (current) use of opiate analgesic: Secondary | ICD-10-CM | POA: Diagnosis not present

## 2022-05-06 DIAGNOSIS — M25562 Pain in left knee: Secondary | ICD-10-CM | POA: Diagnosis not present

## 2022-05-12 ENCOUNTER — Telehealth: Payer: Self-pay | Admitting: Family Medicine

## 2022-05-12 NOTE — Telephone Encounter (Signed)
Spoke with patient she stated she was in [place] and to giver her a CB in 08/2022

## 2022-06-03 DIAGNOSIS — M25551 Pain in right hip: Secondary | ICD-10-CM | POA: Diagnosis not present

## 2022-06-03 DIAGNOSIS — M25552 Pain in left hip: Secondary | ICD-10-CM | POA: Diagnosis not present

## 2022-06-03 DIAGNOSIS — Z79891 Long term (current) use of opiate analgesic: Secondary | ICD-10-CM | POA: Diagnosis not present

## 2022-06-03 DIAGNOSIS — G894 Chronic pain syndrome: Secondary | ICD-10-CM | POA: Diagnosis not present

## 2022-06-03 DIAGNOSIS — M25571 Pain in right ankle and joints of right foot: Secondary | ICD-10-CM | POA: Diagnosis not present

## 2022-06-03 DIAGNOSIS — M25562 Pain in left knee: Secondary | ICD-10-CM | POA: Diagnosis not present

## 2022-06-03 DIAGNOSIS — M25511 Pain in right shoulder: Secondary | ICD-10-CM | POA: Diagnosis not present

## 2022-07-22 DIAGNOSIS — M25562 Pain in left knee: Secondary | ICD-10-CM | POA: Diagnosis not present

## 2022-07-22 DIAGNOSIS — Z79891 Long term (current) use of opiate analgesic: Secondary | ICD-10-CM | POA: Diagnosis not present

## 2022-07-22 DIAGNOSIS — M25551 Pain in right hip: Secondary | ICD-10-CM | POA: Diagnosis not present

## 2022-07-22 DIAGNOSIS — M25552 Pain in left hip: Secondary | ICD-10-CM | POA: Diagnosis not present

## 2022-07-22 DIAGNOSIS — M25571 Pain in right ankle and joints of right foot: Secondary | ICD-10-CM | POA: Diagnosis not present

## 2022-07-22 DIAGNOSIS — G894 Chronic pain syndrome: Secondary | ICD-10-CM | POA: Diagnosis not present

## 2022-07-22 DIAGNOSIS — M25511 Pain in right shoulder: Secondary | ICD-10-CM | POA: Diagnosis not present

## 2022-08-03 ENCOUNTER — Other Ambulatory Visit: Payer: Self-pay | Admitting: Family Medicine

## 2022-08-03 DIAGNOSIS — I10 Essential (primary) hypertension: Secondary | ICD-10-CM

## 2022-08-04 NOTE — Telephone Encounter (Signed)
Patient needs OV, will refill for 30 days until OV can be made. OV needed for additional refills.  Requested Prescriptions  Pending Prescriptions Disp Refills   hydrochlorothiazide (HYDRODIURIL) 12.5 MG tablet [Pharmacy Med Name: HYDROCHLOROTHIAZIDE 12.5MG  TABLETS] 30 tablet 0    Sig: TAKE 1 TABLET(12.5 MG) BY MOUTH DAILY     Cardiovascular: Diuretics - Thiazide Failed - 08/03/2022  4:54 PM      Failed - Cr in normal range and within 180 days    Creatinine, Ser  Date Value Ref Range Status  09/23/2021 1.10 (H) 0.57 - 1.00 mg/dL Final         Failed - K in normal range and within 180 days    Potassium  Date Value Ref Range Status  09/23/2021 4.3 3.5 - 5.2 mmol/L Final         Failed - Na in normal range and within 180 days    Sodium  Date Value Ref Range Status  09/23/2021 141 134 - 144 mmol/L Final         Failed - Last BP in normal range    BP Readings from Last 1 Encounters:  03/07/22 (!) 159/98         Failed - Valid encounter within last 6 months    Recent Outpatient Visits           6 months ago Primary hypertension   [place] [facility]   10 months ago Primary hypertension   [place] [facility]   10 months ago Petechiae   [facility]   10 months ago Petechiae   [facility]   1 year ago Primary hypertension   [place] [facility]

## 2022-08-05 ENCOUNTER — Telehealth: Payer: Self-pay | Admitting: Family Medicine

## 2022-08-05 ENCOUNTER — Other Ambulatory Visit: Payer: Self-pay

## 2022-08-05 DIAGNOSIS — I10 Essential (primary) hypertension: Secondary | ICD-10-CM

## 2022-08-05 MED ORDER — HYDROCHLOROTHIAZIDE 12.5 MG PO TABS: ORAL_TABLET | ORAL | 0 refills | Status: DC

## 2022-08-05 NOTE — Telephone Encounter (Signed)
Left voice mail to set up medication refill with dr Yetta Barre this month.

## 2022-08-05 NOTE — Telephone Encounter (Signed)
-----   Message from Everitt Amber sent at 08/05/2022  1:43 PM EDT ----- Pt needs med refill appt this month

## 2022-08-05 NOTE — Telephone Encounter (Signed)
done

## 2022-08-08 ENCOUNTER — Encounter: Payer: Self-pay | Admitting: Family Medicine

## 2022-08-08 ENCOUNTER — Ambulatory Visit (INDEPENDENT_AMBULATORY_CARE_PROVIDER_SITE_OTHER): Payer: Medicare HMO | Admitting: Family Medicine

## 2022-08-08 VITALS — BP 128/78 | HR 72 | Ht 68.0 in | Wt 197.0 lb

## 2022-08-08 DIAGNOSIS — E785 Hyperlipidemia, unspecified: Secondary | ICD-10-CM

## 2022-08-08 DIAGNOSIS — I10 Essential (primary) hypertension: Secondary | ICD-10-CM | POA: Diagnosis not present

## 2022-08-08 MED ORDER — HYDROCHLOROTHIAZIDE 12.5 MG PO TABS: ORAL_TABLET | ORAL | 1 refills | Status: DC

## 2022-08-08 NOTE — Progress Notes (Signed)
Date:  08/08/2022   Name:  Kaitlyn Ali   DOB:  03/18/1956   MRN:  [id number]   Chief Complaint: Hypertension  Hypertension This is a chronic problem. The current episode started more than 1 year ago. The problem has been waxing and waning since onset. The problem is controlled. Pertinent negatives include no blurred vision, chest pain, headaches, neck pain, orthopnea, palpitations, PND or shortness of breath. There are no associated agents to hypertension. There are no known risk factors for coronary artery disease. Past treatments include diuretics. The current treatment provides moderate improvement. There is no history of CAD/MI or CVA. There is no history of chronic renal disease, a hypertension causing med or renovascular disease.    Lab Results  Component Value Date   NA 141 09/23/2021   K 4.3 09/23/2021   CO2 23 09/23/2021   GLUCOSE 111 (H) 09/23/2021   BUN 17 09/23/2021   CREATININE 1.10 (H) 09/23/2021   CALCIUM 10.6 (H) 09/23/2021   EGFR 56 (L) 09/23/2021   GFRNONAA 56 (L) 10/11/2017   Lab Results  Component Value Date   CHOL 234 (H) 07/15/2021   HDL 43 07/15/2021   LDLCALC 152 (H) 07/15/2021   TRIG 211 (H) 07/15/2021   CHOLHDL 5.6 (H) 02/16/2017   No results found for: "TSH" No results found for: "HGBA1C" Lab Results  Component Value Date   WBC 5.3 01/25/2022   HGB 15.0 01/25/2022   HCT 45.0 01/25/2022   MCV 93.9 01/25/2022   PLT 259 01/25/2022   Lab Results  Component Value Date   AST 17 10/11/2017   ALKPHOS 70 10/11/2017   BILITOT 0.4 10/11/2017   Lab Results  Component Value Date   25OHVITD2 <1.0 10/11/2017   25OHVITD3 26 10/11/2017     Review of Systems  Constitutional: Negative.  Negative for chills, fatigue, fever and unexpected weight change.  HENT:  Negative for congestion, ear discharge, ear pain, rhinorrhea, sinus pressure, sneezing and sore throat.   Eyes:  Negative for blurred vision.  Respiratory:  Negative for cough, shortness  of breath, wheezing and stridor.   Cardiovascular:  Negative for chest pain, palpitations, orthopnea and PND.  Gastrointestinal:  Negative for abdominal pain, blood in stool, constipation, diarrhea and nausea.  Genitourinary:  Negative for dysuria, flank pain, frequency, hematuria, urgency and vaginal discharge.  Musculoskeletal:  Negative for arthralgias, back pain, myalgias and neck pain.  Skin:  Negative for rash.  Neurological:  Negative for dizziness, weakness and headaches.  Hematological:  Negative for adenopathy. Does not bruise/bleed easily.  Psychiatric/Behavioral:  Negative for dysphoric mood. The patient is not nervous/anxious.     Patient Active Problem List   Diagnosis Date Noted   Statin myopathy 04/22/2022   Invasive ductal carcinoma of right breast (HCC) 11/11/2021   Family history of breast cancer 06/14/2021   Chronic pain of both knees (Secondary Area of Pain) (L>R) 10/11/2017   Chronic right shoulder pain (Primary Area of Pain) 10/11/2017   Chronic pain syndrome 10/11/2017   Long term current use of opiate analgesic 10/11/2017   Pharmacologic therapy 10/11/2017   Disorder of skeletal system 10/11/2017   Problems influencing health status 10/11/2017    Allergies  Allergen Reactions   Doxycycline Itching   Nsaids     Bothers stomach   Statins     Past Surgical History:  Procedure Laterality Date   BREAST BIOPSY Right 09/27/2021   UOQ mid lateral calcs, coil marker, DCIS   BREAST BIOPSY Right  09/27/2021   UOQ calcs anterior, X marker, DCIS   BREAST BIOPSY Left 09/27/2021   Distortion, ribbon marker, FIBROSIS AND USUAL DUCTAL HYPERPLASIA   BREAST LUMPECTOMY WITH NEEDLE LOCALIZATION Right 10/20/2021   Procedure: BREAST LUMPECTOMY WITH NEEDLE LOCALIZATION;  Surgeon: Earline Mayotte, MD;  Location: [facility] ORS;  Service: General;  Laterality: Right;   LAPAROSCOPIC HYSTERECTOMY     supracervical hyst due to AUB; Dr. Barnabas Lister   TOE SURGERY     shaved bone in  R) great toe   TONSILLECTOMY     wire placement Right 10/20/2021   bracking for lumpectomy    Social History   Tobacco Use   Smoking status: Former    Packs/day: 0.25    Years: 15.00    Additional pack years: 0.00    Total pack years: 3.75    Types: Cigarettes    Quit date: 2013    Years since quitting: 11.3   Smokeless tobacco: Never   Tobacco comments:    Social smoker only  Vaping Use   Vaping Use: Never used  Substance Use Topics   Alcohol use: Not Currently   Drug use: No     Medication list has been reviewed and updated.  Current Meds  Medication Sig   diazepam (VALIUM) 5 MG tablet Take 5 mg by mouth 2 (two) times daily as needed for anxiety. Dr Evelene Croon   diphenhydrAMINE (BENADRYL) 25 mg capsule Take 25 mg by mouth every morning.   hydrochlorothiazide (HYDRODIURIL) 12.5 MG tablet TAKE 1 TABLET(12.5 MG) BY MOUTH DAILY   oxyCODONE-acetaminophen (PERCOCET) 10-325 MG per tablet Take 1 tablet by mouth 3 (three) times daily. [facility] pain management   triamcinolone cream (KENALOG) 0.1 % Apply 1 application. topically 2 (two) times daily.       08/08/2022    9:26 AM 01/14/2022   11:09 AM 09/09/2021   10:33 AM 07/15/2021    8:53 AM  GAD 7 : Generalized Anxiety Score  Nervous, Anxious, on Edge 0 0 2 0  Control/stop worrying 0 0 1 0  Worry too much - different things 0 0 1 0  Trouble relaxing 0 0 1 0  Restless 0 0 0 0  Easily annoyed or irritable 0 0 0 0  Afraid - awful might happen 0 0 0 0  Total GAD 7 Score 0 0 5 0  Anxiety Difficulty Not difficult at all Not difficult at all Not difficult at all Not difficult at all       08/08/2022    9:26 AM 01/14/2022   11:09 AM 09/09/2021   10:33 AM  Depression screen PHQ 2/9  Decreased Interest 0 0 1  Down, Depressed, Hopeless 0 0 0  PHQ - 2 Score 0 0 1  Altered sleeping 0 0 1  Tired, decreased energy 0 0 0  Change in appetite 0 0 1  Feeling bad or failure about yourself  0 0 0  Trouble concentrating 0 0 0  Moving slowly or  fidgety/restless 0 0 1  Suicidal thoughts 0 0 0  PHQ-9 Score 0 0 4  Difficult doing work/chores Not difficult at all Not difficult at all Not difficult at all    BP Readings from Last 3 Encounters:  08/08/22 128/78  03/07/22 (!) 159/98  01/14/22 128/66    Physical Exam Vitals and nursing note reviewed. Exam conducted with a chaperone present.  Constitutional:      General: She is not in acute distress.    Appearance: She is  not diaphoretic.  HENT:     Head: Normocephalic and atraumatic.     Right Ear: Tympanic membrane, ear canal and external ear normal. There is no impacted cerumen.     Left Ear: Tympanic membrane, ear canal and external ear normal. There is no impacted cerumen.     Nose: Nose normal. No congestion or rhinorrhea.     Mouth/Throat:     Mouth: Mucous membranes are moist.     Pharynx: No oropharyngeal exudate or posterior oropharyngeal erythema.  Eyes:     General:        Right eye: No discharge.        Left eye: No discharge.     Conjunctiva/sclera: Conjunctivae normal.     Pupils: Pupils are equal, round, and reactive to light.  Neck:     Thyroid: No thyromegaly.     Vascular: No carotid bruit or JVD.  Cardiovascular:     Rate and Rhythm: Normal rate and regular rhythm.     Heart sounds: Normal heart sounds. No murmur heard.    No friction rub. No gallop.  Pulmonary:     Effort: Pulmonary effort is normal.     Breath sounds: Normal breath sounds. No wheezing, rhonchi or rales.  Abdominal:     General: Bowel sounds are normal.     Palpations: Abdomen is soft. There is no mass.     Tenderness: There is no abdominal tenderness. There is no guarding.  Musculoskeletal:        General: Normal range of motion.     Cervical back: Normal range of motion and neck supple.  Lymphadenopathy:     Cervical: No cervical adenopathy.  Skin:    General: Skin is warm and dry.  Neurological:     Mental Status: She is alert.     Deep Tendon Reflexes: Reflexes are  normal and symmetric.     Wt Readings from Last 3 Encounters:  08/08/22 197 lb (89.4 kg)  03/07/22 197 lb (89.4 kg)  01/14/22 191 lb (86.6 kg)    BP 128/78   Pulse 72   Ht 5\' 8"  (1.727 m)   Wt 197 lb (89.4 kg)   SpO2 98%   BMI 29.95 kg/m   Assessment and Plan: 1. Primary hypertension Chronic.  Controlled.  Stable.  Blood pressure today is 128/78.  Asymptomatic.  Tolerating medication well.  Continue hydrochlorothiazide 12.5 mg once a day.  And will recheck in 6 months.  Will check CMP for electrolytes and GFR in the meantime. - Comprehensive Metabolic Panel (CMET) - hydrochlorothiazide (HYDRODIURIL) 12.5 MG tablet; TAKE 1 TABLET(12.5 MG) BY MOUTH DAILY  Dispense: 90 tablet; Refill: 1  2. Dyslipidemia Chronic.  Controlled.  Stable.  This is diet controlled and we will check lipid panel for current level of control of LDL. - Lipid Panel With LDL/HDL Ratio     Elizabeth Sauer, MD

## 2022-08-09 LAB — COMPREHENSIVE METABOLIC PANEL
ALT: 41 IU/L — ABNORMAL HIGH (ref 0–32)
AST: 28 IU/L (ref 0–40)
Albumin/Globulin Ratio: 1.9 (ref 1.2–2.2)
Albumin: 4.6 g/dL (ref 3.9–4.9)
Alkaline Phosphatase: 74 IU/L (ref 44–121)
BUN/Creatinine Ratio: 17 (ref 12–28)
BUN: 16 mg/dL (ref 8–27)
Bilirubin Total: 0.5 mg/dL (ref 0.0–1.2)
CO2: 24 mmol/L (ref 20–29)
Calcium: 10.4 mg/dL — ABNORMAL HIGH (ref 8.7–10.3)
Chloride: 98 mmol/L (ref 96–106)
Creatinine, Ser: 0.94 mg/dL (ref 0.57–1.00)
Globulin, Total: 2.4 g/dL (ref 1.5–4.5)
Glucose: 107 mg/dL — ABNORMAL HIGH (ref 70–99)
Potassium: 4.6 mmol/L (ref 3.5–5.2)
Sodium: 141 mmol/L (ref 134–144)
Total Protein: 7 g/dL (ref 6.0–8.5)
eGFR: 67 mL/min/{1.73_m2} (ref >59)

## 2022-08-09 LAB — LIPID PANEL WITH LDL/HDL RATIO
Cholesterol, Total: 267 mg/dL — ABNORMAL HIGH (ref 100–199)
HDL: 40 mg/dL (ref >39)
LDL Chol Calc (NIH): 165 mg/dL — ABNORMAL HIGH (ref 0–99)
LDL/HDL Ratio: 4.1 ratio — ABNORMAL HIGH (ref 0.0–3.2)
Triglycerides: 324 mg/dL — ABNORMAL HIGH (ref 0–149)
VLDL Cholesterol Cal: 62 mg/dL — ABNORMAL HIGH (ref 5–40)

## 2022-08-18 ENCOUNTER — Telehealth: Payer: Self-pay | Admitting: Family Medicine

## 2022-08-18 NOTE — Telephone Encounter (Signed)
Contacted Promise Riggs [place] to schedule their annual wellness visit. Call back at later date: 09/2022  [name]; Care Guide Ambulatory Clinical Support [place] [facility] Direct Dial: [phone]

## 2022-08-19 DIAGNOSIS — M25562 Pain in left knee: Secondary | ICD-10-CM | POA: Diagnosis not present

## 2022-08-19 DIAGNOSIS — M25511 Pain in right shoulder: Secondary | ICD-10-CM | POA: Diagnosis not present

## 2022-08-19 DIAGNOSIS — M25552 Pain in left hip: Secondary | ICD-10-CM | POA: Diagnosis not present

## 2022-08-19 DIAGNOSIS — G894 Chronic pain syndrome: Secondary | ICD-10-CM | POA: Diagnosis not present

## 2022-08-19 DIAGNOSIS — M25551 Pain in right hip: Secondary | ICD-10-CM | POA: Diagnosis not present

## 2022-08-19 DIAGNOSIS — Z79891 Long term (current) use of opiate analgesic: Secondary | ICD-10-CM | POA: Diagnosis not present

## 2022-08-19 DIAGNOSIS — M25571 Pain in right ankle and joints of right foot: Secondary | ICD-10-CM | POA: Diagnosis not present

## 2022-09-08 ENCOUNTER — Other Ambulatory Visit: Payer: Self-pay | Admitting: Family Medicine

## 2022-09-08 DIAGNOSIS — I10 Essential (primary) hypertension: Secondary | ICD-10-CM

## 2022-09-08 NOTE — Telephone Encounter (Signed)
Rx 08/08/22 #90 1RF- too soon Requested Prescriptions  Pending Prescriptions Disp Refills   hydrochlorothiazide (HYDRODIURIL) 12.5 MG tablet [Pharmacy Med Name: HYDROCHLOROTHIAZIDE 12.5MG  TABLETS] 90 tablet 1    Sig: TAKE 1 TABLET(12.5 MG) BY MOUTH DAILY     Cardiovascular: Diuretics - Thiazide Passed - 09/08/2022 11:02 AM      Passed - Cr in normal range and within 180 days    Creatinine, Ser  Date Value Ref Range Status  08/08/2022 0.94 0.57 - 1.00 mg/dL Final         Passed - K in normal range and within 180 days    Potassium  Date Value Ref Range Status  08/08/2022 4.6 3.5 - 5.2 mmol/L Final         Passed - Na in normal range and within 180 days    Sodium  Date Value Ref Range Status  08/08/2022 141 134 - 144 mmol/L Final         Passed - Last BP in normal range    BP Readings from Last 1 Encounters:  08/08/22 128/78         Passed - Valid encounter within last 6 months    Recent Outpatient Visits           1 month ago Primary hypertension   [place] [facility]   7 months ago Primary hypertension   [place] [facility]   11 months ago Primary hypertension   [place] [facility]   11 months ago Petechiae   [facility]   12 months ago Petechiae   [facility]       Future Appointments             In 4 months Duanne Limerick, MD [facility] at [facility], [facility] [place]

## 2022-09-13 ENCOUNTER — Other Ambulatory Visit: Payer: Self-pay

## 2022-09-13 DIAGNOSIS — I10 Essential (primary) hypertension: Secondary | ICD-10-CM

## 2022-09-13 MED ORDER — HYDROCHLOROTHIAZIDE 12.5 MG PO TABS
ORAL_TABLET | ORAL | 0 refills | Status: DC
Start: 2022-09-13 — End: 2023-01-31

## 2022-09-16 DIAGNOSIS — M25511 Pain in right shoulder: Secondary | ICD-10-CM | POA: Diagnosis not present

## 2022-09-16 DIAGNOSIS — Z79899 Other long term (current) drug therapy: Secondary | ICD-10-CM | POA: Diagnosis not present

## 2022-09-16 DIAGNOSIS — Z79891 Long term (current) use of opiate analgesic: Secondary | ICD-10-CM | POA: Diagnosis not present

## 2022-09-16 DIAGNOSIS — M25562 Pain in left knee: Secondary | ICD-10-CM | POA: Diagnosis not present

## 2022-09-16 DIAGNOSIS — M25551 Pain in right hip: Secondary | ICD-10-CM | POA: Diagnosis not present

## 2022-09-16 DIAGNOSIS — G894 Chronic pain syndrome: Secondary | ICD-10-CM | POA: Diagnosis not present

## 2022-09-16 DIAGNOSIS — M25571 Pain in right ankle and joints of right foot: Secondary | ICD-10-CM | POA: Diagnosis not present

## 2022-09-16 DIAGNOSIS — M25552 Pain in left hip: Secondary | ICD-10-CM | POA: Diagnosis not present

## 2022-10-03 ENCOUNTER — Telehealth: Payer: Self-pay | Admitting: Family Medicine

## 2022-10-03 NOTE — Telephone Encounter (Signed)
Copied from CRM [id number]. Topic: Medicare AWV >> Oct 03, 2022  2:36 PM Payton Doughty wrote: Reason for CRM: LM 10/03/2022 to schedule AWV   [name]; Care Guide Ambulatory Clinical Support [place] [facility] Direct Dial: [phone]

## 2022-10-14 DIAGNOSIS — M25562 Pain in left knee: Secondary | ICD-10-CM | POA: Diagnosis not present

## 2022-10-14 DIAGNOSIS — Z79899 Other long term (current) drug therapy: Secondary | ICD-10-CM | POA: Diagnosis not present

## 2022-10-14 DIAGNOSIS — M25511 Pain in right shoulder: Secondary | ICD-10-CM | POA: Diagnosis not present

## 2022-10-14 DIAGNOSIS — M25552 Pain in left hip: Secondary | ICD-10-CM | POA: Diagnosis not present

## 2022-10-14 DIAGNOSIS — M25551 Pain in right hip: Secondary | ICD-10-CM | POA: Diagnosis not present

## 2022-10-14 DIAGNOSIS — G894 Chronic pain syndrome: Secondary | ICD-10-CM | POA: Diagnosis not present

## 2022-10-14 DIAGNOSIS — Z79891 Long term (current) use of opiate analgesic: Secondary | ICD-10-CM | POA: Diagnosis not present

## 2022-10-14 DIAGNOSIS — M25571 Pain in right ankle and joints of right foot: Secondary | ICD-10-CM | POA: Diagnosis not present

## 2022-10-28 ENCOUNTER — Telehealth: Payer: Self-pay

## 2022-10-28 ENCOUNTER — Telehealth: Payer: Self-pay | Admitting: Family Medicine

## 2022-10-28 NOTE — Telephone Encounter (Signed)
Copied from CRM [id number]. Topic: Medicare AWV >> Oct 28, 2022  9:29 AM Payton Doughty wrote: Reason for CRM: LM 10/28/2022 to schedule AWV   [name]; Care Guide Ambulatory Clinical Support  l [facility] Direct Dial: [phone]

## 2022-10-28 NOTE — Telephone Encounter (Signed)
Call to patient for annual one time follow up for the MTG-015 blood study. Message left for patient to return call at her earliest convenience. Chriss Driver, RN 10/28/22 2:52 PM

## 2022-10-31 ENCOUNTER — Telehealth: Payer: Self-pay

## 2022-10-31 NOTE — Telephone Encounter (Signed)
MTG-015 - Tissue and Bodily Fluids: Translational Medicine: Discovery and Evaluation of Biomarkers/Pharmacogenomics for the Diagnosis and Personalized Management of Patients    One Year Follow Up:   Research nurse called patient today for the one year follow up for MTG-015 protocol as required. 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 She said after the surgery everything changed and they wanted her to have chemotherapy and radiation. Patient says she refused to take the chemotherapy, but did take the radiation treatments. She states she has seen family members of hers take the treatment and still not make it. She doesn't believe in the chemotherapy treatment. States she is doing well now without any other changes in her medical history. Her current medications were reviewed in detail. She is scheduled to have her mammogram for follow up soon. Nurse encouraged participate in the breast screening process. Patient notified the research office will mail her a gift card from the study as before for her participation.  Chriss Driver, RN 10/31/22 4:41 PM

## 2022-11-11 DIAGNOSIS — M25562 Pain in left knee: Secondary | ICD-10-CM | POA: Diagnosis not present

## 2022-11-11 DIAGNOSIS — M25552 Pain in left hip: Secondary | ICD-10-CM | POA: Diagnosis not present

## 2022-11-11 DIAGNOSIS — M25511 Pain in right shoulder: Secondary | ICD-10-CM | POA: Diagnosis not present

## 2022-11-11 DIAGNOSIS — M25571 Pain in right ankle and joints of right foot: Secondary | ICD-10-CM | POA: Diagnosis not present

## 2022-11-11 DIAGNOSIS — Z79891 Long term (current) use of opiate analgesic: Secondary | ICD-10-CM | POA: Diagnosis not present

## 2022-11-11 DIAGNOSIS — G894 Chronic pain syndrome: Secondary | ICD-10-CM | POA: Diagnosis not present

## 2022-11-11 DIAGNOSIS — M25551 Pain in right hip: Secondary | ICD-10-CM | POA: Diagnosis not present

## 2022-11-15 ENCOUNTER — Telehealth: Payer: Self-pay | Admitting: Family Medicine

## 2022-11-15 ENCOUNTER — Ambulatory Visit: Payer: Medicare HMO

## 2022-11-15 NOTE — Telephone Encounter (Signed)
Copied from CRM [id number]. Topic: Medicare AWV >> Nov 15, 2022  2:33 PM Kaitlyn Ali wrote: Reason for CRM: LVM to move AWV appt time from 11:30am to 2pm.  Please confirm the time change or she can r/s to different date due to [facility] out sick. [facility]    [name]; Care Guide Ambulatory Clinical Support [place] [facility] Direct Dial: [phone]

## 2022-11-16 ENCOUNTER — Ambulatory Visit (INDEPENDENT_AMBULATORY_CARE_PROVIDER_SITE_OTHER): Payer: Medicare HMO

## 2022-11-16 VITALS — Ht 68.0 in | Wt 193.0 lb

## 2022-11-16 DIAGNOSIS — Z0000 Encounter for general adult medical examination without abnormal findings: Secondary | ICD-10-CM | POA: Diagnosis not present

## 2022-11-16 NOTE — Progress Notes (Signed)
Subjective:   Kaitlyn Ali is a 67 y.o. female who presents for Medicare Annual (Subsequent) preventive examination.  Visit Complete: Virtual  I connected with  Kaitlyn Ali on 11/16/22 by a audio enabled telemedicine application and verified that I am speaking with the correct person using two identifiers.  Patient Location: Home  Provider Location: Home Office  I discussed the limitations of evaluation and management by telemedicine. The patient expressed understanding and agreed to proceed.  Patient Medicare AWV questionnaire was completed by the patient on  ; I have confirmed that all information answered by patient is correct and no changes since this date.  Review of Systems    Vital Signs: Unable to obtain new vitals due to this being a telehealth visit.  Cardiac Risk Factors include: advanced age (>66men, >59 women)     Objective:    Today's Vitals   11/16/22 1355  Weight: 193 lb (87.5 kg)  Height: 5\' 8"  (1.727 m)   Body mass index is 29.35 kg/m.     11/16/2022    2:02 PM 03/07/2022   10:36 AM 11/18/2021   10:24 AM 11/11/2021   10:02 AM 11/04/2021    1:04 PM 10/20/2021   11:29 AM 10/14/2021   11:14 AM  Advanced Directives  Does Patient Have a Medical Advance Directive? No No No No No No No  Would patient like information on creating a medical advance directive? No - Patient declined No - Patient declined  No - Patient declined No - Patient declined No - Patient declined No - Patient declined    Current Medications (verified) Outpatient Encounter Medications as of 11/16/2022  Medication Sig   diazepam (VALIUM) 5 MG tablet Take 5 mg by mouth 2 (two) times daily as needed for anxiety. Dr Evelene Croon   diphenhydrAMINE (BENADRYL) 25 mg capsule Take 25 mg by mouth every morning.   hydrochlorothiazide (HYDRODIURIL) 12.5 MG tablet TAKE 1 TABLET(12.5 MG) BY MOUTH DAILY   oxyCODONE-acetaminophen (PERCOCET) 10-325 MG per tablet Take 1 tablet by mouth 3 (three) times  daily. [facility] pain management   triamcinolone cream (KENALOG) 0.1 % Apply 1 application. topically 2 (two) times daily.   [DISCONTINUED] Dermatological Products, Misc. (STRATA XRT) GEL Apply 1 Application topically 3 (three) times daily as needed. (Patient not taking: Reported on 08/08/2022)   No facility-administered encounter medications on file as of 11/16/2022.    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   Depression    GERD (gastroesophageal reflux disease)    h/o with pregnancy   Hypertension    Panic attack    Past Surgical History:  Procedure Laterality Date   BREAST BIOPSY Right 09/27/2021   UOQ mid lateral calcs, coil marker, DCIS   BREAST BIOPSY Right 09/27/2021   UOQ calcs anterior, X marker, DCIS   BREAST BIOPSY Left 09/27/2021   Distortion, ribbon marker, FIBROSIS AND USUAL DUCTAL HYPERPLASIA   BREAST LUMPECTOMY WITH NEEDLE LOCALIZATION Right 10/20/2021   Procedure: BREAST LUMPECTOMY WITH NEEDLE LOCALIZATION;  Surgeon: Earline Mayotte, MD;  Location: [facility] ORS;  Service: General;  Laterality: Right;   LAPAROSCOPIC HYSTERECTOMY     supracervical hyst due to AUB; Dr. Barnabas Lister   TOE SURGERY     shaved bone in R) great toe   TONSILLECTOMY  wire placement Right 10/20/2021   bracking for lumpectomy   Family History  Problem Relation Age of Onset   Diabetes Mother    Liver cancer Father 95   Breast cancer Sister 41       NEG BRCA testing   Brain cancer Paternal Uncle    Leukemia Maternal Grandfather    Breast cancer Cousin        dx 30s-40s   Social History   Socioeconomic History   Marital status: Single    Spouse name: Not on file   Number of children: Not on file   Years of education: Not on file   Highest education level: Not on file  Occupational History   Not on file   Tobacco Use   Smoking status: Former    Current packs/day: 0.00    Average packs/day: 0.3 packs/day for 15.0 years (3.8 ttl pk-yrs)    Types: Cigarettes    Start date: 89    Quit date: 2013    Years since quitting: 11.6   Smokeless tobacco: Never   Tobacco comments:    Social smoker only  Vaping Use   Vaping status: Never Used  Substance and Sexual Activity   Alcohol use: Not Currently   Drug use: No   Sexual activity: Yes  Other Topics Concern   Not on file  Social History Narrative   Not on file   Social Determinants of Health   Financial Resource Strain: Low Risk  (11/16/2022)   Overall Financial Resource Strain (CARDIA)    Difficulty of Paying Living Expenses: Not hard at all  Food Insecurity: No Food Insecurity (11/16/2022)   Hunger Vital Sign    Worried About Running Out of Food in the Last Year: Never true    Ran Out of Food in the Last Year: Never true  Transportation Needs: No Transportation Needs (11/16/2022)   PRAPARE - Administrator, Civil Service (Medical): No    Lack of Transportation (Non-Medical): No  Physical Activity: Inactive (11/16/2022)   Exercise Vital Sign    Days of Exercise per Week: 0 days    Minutes of Exercise per Session: 0 min  Stress: No Stress Concern Present (11/16/2022)   [employer] of Occupational Health - Occupational Stress Questionnaire    Feeling of Stress : Not at all  Social Connections: Moderately Integrated (11/16/2022)   Social Connection and Isolation Panel [NHANES]    Frequency of Communication with Friends and Family: More than three times a week    Frequency of Social Gatherings with Friends and Family: More than three times a week    Attends Religious Services: More than 4 times per year    Active Member of [employer] or Organizations: Yes    Attends Engineer, structural: More than 4 times per year    Marital Status: Divorced    Tobacco Counseling Counseling given: Not Answered Tobacco  comments: Social smoker only   Clinical Intake:  Pre-visit preparation completed: No  Pain : No/denies pain     BMI - recorded: 29.35 Nutritional Status: BMI 25 -29 Overweight Nutritional Risks: None Diabetes: No  How often do you need to have someone help you when you read instructions, pamphlets, or other written materials from your doctor or pharmacy?: 1 - Never  Interpreter Needed?: No  Information entered by :: Theresa Mulligan LPN   Activities of Daily Living    11/16/2022    2:01 PM  In your present state of health, do  you have any difficulty performing the following activities:  Hearing? 0  Vision? 0  Difficulty concentrating or making decisions? 0  Walking or climbing stairs? 0  Dressing or bathing? 0  Doing errands, shopping? 0  Preparing Food and eating ? N  Using the Toilet? N  In the past six months, have you accidently leaked urine? N  Do you have problems with loss of bowel control? N  Managing your Medications? N  Managing your Finances? N  Housekeeping or managing your Housekeeping? N    Patient Care Team: Duanne Limerick, MD as PCP - General (Family Medicine) Hulen Luster, RN as Oncology Nurse Navigator  Indicate any recent Medical Services you may have received from other than [facility] providers in the past year (date may be approximate).     Assessment:   This is a routine wellness examination for Kaitlyn Ali.  Hearing/Vision screen Hearing Screening - Comments:: Denies hearing difficulties   Vision Screening - Comments:: Wears contacts glasses - up to date with routine eye exams with  [facility]  Dietary issues and exercise activities discussed:     Goals Addressed               This Visit's Progress     Lose weight (pt-stated)         Depression Screen    11/16/2022    2:00 PM 08/08/2022    9:26 AM 01/14/2022   11:09 AM 09/09/2021   10:33 AM 07/15/2021    8:52 AM 04/19/2021    3:28 PM 10/04/2018    1:21 PM  PHQ 2/9 Scores   PHQ - 2 Score 0 0 0 1 0 0 1  PHQ- 9 Score  0 0 4 0  1    Fall Risk    11/16/2022    2:01 PM 08/08/2022    9:26 AM 01/14/2022   11:09 AM 09/09/2021   10:34 AM 04/19/2021    3:22 PM  Fall Risk   Falls in the past year? 0 0 0 0 0  Number falls in past yr: 0 0 0 0 0  Injury with Fall? 0 0 0 0 0  Risk for fall due to : No Fall Risks No Fall Risks No Fall Risks No Fall Risks No Fall Risks  Follow up Falls prevention discussed Falls evaluation completed Falls evaluation completed Falls evaluation completed Falls evaluation completed    MEDICARE RISK AT HOME:  Medicare Risk at Home - 11/16/22 1407     Any stairs in or around the home? No    If so, are there any without handrails? No    Home free of loose throw rugs in walkways, pet beds, electrical cords, etc? Yes    Adequate lighting in your home to reduce risk of falls? Yes    Life alert? No    Use of a cane, walker or w/c? No    Grab bars in the bathroom? Yes    Shower chair or bench in shower? No    Elevated toilet seat or a handicapped toilet? No             TIMED UP AND GO:  Was the test performed?  No    Cognitive Function:        11/16/2022    2:02 PM  6CIT Screen  What Year? 0 points  What month? 0 points  What time? 0 points  Count back from 20 0 points  Months in reverse 0 points  Repeat phrase 0 points  Total Score 0 points    Immunizations  There is no immunization history on file for this patient.  TDAP status: Due, Education has been provided regarding the importance of this vaccine. Advised may receive this vaccine at local pharmacy or Health Dept. Aware to provide a copy of the vaccination record if obtained from local pharmacy or Health Dept. Verbalized acceptance and understanding.  Flu Vaccine status: Declined, Education has been provided regarding the importance of this vaccine but patient still declined. Advised may receive this vaccine at local pharmacy or Health Dept. Aware to provide a copy  of the vaccination record if obtained from local pharmacy or Health Dept. Verbalized acceptance and understanding.  Pneumococcal vaccine status: Declined,  Education has been provided regarding the importance of this vaccine but patient still declined. Advised may receive this vaccine at local pharmacy or Health Dept. Aware to provide a copy of the vaccination record if obtained from local pharmacy or Health Dept. Verbalized acceptance and understanding.   Covid-19 vaccine status: Declined, Education has been provided regarding the importance of this vaccine but patient still declined. Advised may receive this vaccine at local pharmacy or Health Dept.or vaccine clinic. Aware to provide a copy of the vaccination record if obtained from local pharmacy or Health Dept. Verbalized acceptance and understanding.  Qualifies for Shingles Vaccine? Yes   Zostavax completed No   Shingrix Completed?: No.    Education has been provided regarding the importance of this vaccine. Patient has been advised to call insurance company to determine out of pocket expense if they have not yet received this vaccine. Advised may also receive vaccine at local pharmacy or Health Dept. Verbalized acceptance and understanding.  Screening Tests Health Maintenance  Topic Date Due   COVID-19 Vaccine (1) Never done   Hepatitis C Screening  Never done   DTaP/Tdap/Td (1 - Tdap) Never done   Zoster Vaccines- Shingrix (1 of 2) Never done   Pneumonia Vaccine 35+ Years old (1 of 1 - PCV) Never done   INFLUENZA VACCINE  11/03/2022   Colonoscopy  04/05/2023   MAMMOGRAM  08/17/2023   Medicare Annual Wellness (AWV)  11/16/2023   DEXA SCAN  Completed   HPV VACCINES  Aged Out    Health Maintenance  Health Maintenance Due  Topic Date Due   COVID-19 Vaccine (1) Never done   Hepatitis C Screening  Never done   DTaP/Tdap/Td (1 - Tdap) Never done   Zoster Vaccines- Shingrix (1 of 2) Never done   Pneumonia Vaccine 80+ Years old (1 of  1 - PCV) Never done   INFLUENZA VACCINE  11/03/2022    Colorectal cancer screening: Type of screening: Colonoscopy. Completed 04/04/13. Repeat every 10 years  Mammogram status: Ordered 03/07/22. Pt provided with contact info and advised to call to schedule appt.   Bone Density status: Completed 08/09/21. Results reflect: Bone density results: NORMAL. Repeat every   years.  Lung Cancer Screening: (Low Dose CT Chest recommended if Age 55-80 years, 20 pack-year currently smoking OR have quit w/in 15years.) does not qualify.     Additional Screening:  Hepatitis C Screening: does qualify;  Deferred  Vision Screening: Recommended annual ophthalmology exams for early detection of glaucoma and other disorders of the eye. Is the patient up to date with their annual eye exam?  Yes  Who is the provider or what is the name of the office in which the patient attends annual eye exams? [facility] care If  pt is not established with a provider, would they like to be referred to a provider to establish care? No .   Dental Screening: Recommended annual dental exams for proper oral hygiene    Community Resource Referral / Chronic Care Management:  CRR required this visit?  No   CCM required this visit?  No     Plan:     I have personally reviewed and noted the following in the patient's chart:   Medical and social history Use of alcohol, tobacco or illicit drugs  Current medications and supplements including opioid prescriptions. Patient is currently taking opioid prescriptions. Information provided to patient regarding non-opioid alternatives. Patient advised to discuss non-opioid treatment plan with their provider. Functional ability and status Nutritional status Physical activity Advanced directives List of other physicians Hospitalizations, surgeries, and ER visits in previous 12 months Vitals Screenings to include cognitive, depression, and falls Referrals and appointments  In  addition, I have reviewed and discussed with patient certain preventive protocols, quality metrics, and best practice recommendations. A written personalized care plan for preventive services as well as general preventive health recommendations were provided to patient.     Tillie Rung, LPN   [id number]   After Visit Summary: (MyChart) Due to this being a telephonic visit, the after visit summary with patients personalized plan was offered to patient via MyChart   Nurse Notes: Patient due Hep-C Screening

## 2022-11-16 NOTE — Patient Instructions (Addendum)
Kaitlyn Ali , Thank you for taking time to come for your Medicare Wellness Visit. I appreciate your ongoing commitment to your health goals. Please review the following plan we discussed and let me know if I can assist you in the future.   Referrals/Orders/Follow-Ups/Clinician Recommendations:   This is a list of the screening recommended for you and due dates:  Health Maintenance  Topic Date Due   COVID-19 Vaccine (1) Never done   Hepatitis C Screening  Never done   DTaP/Tdap/Td vaccine (1 - Tdap) Never done   Zoster (Shingles) Vaccine (1 of 2) Never done   Pneumonia Vaccine (1 of 1 - PCV) Never done   Flu Shot  11/03/2022   Colon Cancer Screening  04/05/2023   Mammogram  08/17/2023   Medicare Annual Wellness Visit  11/16/2023   DEXA scan (bone density measurement)  Completed   HPV Vaccine  Aged Out   Opioid Pain Medicine Management Opioids are powerful medicines that are used to treat moderate to severe pain. When used for short periods of time, they can help you to: Sleep better. Do better in physical or occupational therapy. Feel better in the first few days after an injury. Recover from surgery. Opioids should be taken with the supervision of a trained health care provider. They should be taken for the shortest period of time possible. This is because opioids can be addictive, and the longer you take opioids, the greater your risk of addiction. This addiction can also be called opioid use disorder. What are the risks? Using opioid pain medicines for longer than 3 days increases your risk of side effects. Side effects include: Constipation. Nausea and vomiting. Breathing difficulties (respiratory depression). Drowsiness. Confusion. Opioid use disorder. Itching. Taking opioid pain medicine for a long period of time can affect your ability to do daily tasks. It also puts you at risk for: Motor vehicle crashes. Depression. Suicide. Heart attack. Overdose, which can be  life-threatening. What is a pain treatment plan? A pain treatment plan is an agreement between you and your health care provider. Pain is unique to each person, and treatments vary depending on your condition. To manage your pain, you and your health care provider need to work together. To help you do this: Discuss the goals of your treatment, including how much pain you might expect to have and how you will manage the pain. Review the risks and benefits of taking opioid medicines. Remember that a good treatment plan uses more than one approach and minimizes the chance of side effects. Be honest about the amount of medicines you take and about any drug or alcohol use. Get pain medicine prescriptions from only one health care provider. Pain can be managed with many types of alternative treatments. Ask your health care provider to refer you to one or more specialists who can help you manage pain through: Physical or occupational therapy. Counseling (cognitive behavioral therapy). Good nutrition. Biofeedback. Massage. Meditation. Non-opioid medicine. Following a gentle exercise program. How to use opioid pain medicine Taking medicine Take your pain medicine exactly as told by your health care provider. Take it only when you need it. If your pain gets less severe, you may take less than your prescribed dose if your health care provider approves. If you are not having pain, do nottake pain medicine unless your health care provider tells you to take it. If your pain is severe, do nottry to treat it yourself by taking more pills than instructed on your prescription. Contact  your health care provider for help. Write down the times when you take your pain medicine. It is easy to become confused while on pain medicine. Writing the time can help you avoid overdose. Take other over-the-counter or prescription medicines only as told by your health care provider. Keeping yourself and others safe  While  you are taking opioid pain medicine: Do not drive, use machinery, or power tools. Do not sign legal documents. Do not drink alcohol. Do not take sleeping pills. Do not supervise children by yourself. Do not do activities that require climbing or being in high places. Do not go to a lake, river, ocean, spa, or swimming pool. Do not share your pain medicine with anyone. Keep pain medicine in a locked cabinet or in a secure area where pets and children cannot reach it. Stopping your use of opioids If you have been taking opioid medicine for more than a few weeks, you may need to slowly decrease (taper) how much you take until you stop completely. Tapering your use of opioids can decrease your risk of symptoms of withdrawal, such as: Pain and cramping in the abdomen. Nausea. Sweating. Sleepiness. Restlessness. Uncontrollable shaking (tremors). Cravings for the medicine. Do not attempt to taper your use of opioids on your own. Talk with your health care provider about how to do this. Your health care provider may prescribe a step-down schedule based on how much medicine you are taking and how long you have been taking it. Getting rid of leftover pills Do not save any leftover pills. Get rid of leftover pills safely by: Taking the medicine to a prescription take-back program. This is usually offered by the county or law enforcement. Bringing them to a pharmacy that has a drug disposal container. Flushing them down the toilet. Check the label or package insert of your medicine to see whether this is safe to do. Throwing them out in the trash. Check the label or package insert of your medicine to see whether this is safe to do. If it is safe to throw it out, remove the medicine from the original container, put it into a sealable bag or container, and mix it with used coffee grounds, food scraps, dirt, or cat litter before putting it in the trash. Follow these instructions at home: Activity Do  exercises as told by your health care provider. Avoid activities that make your pain worse. Return to your normal activities as told by your health care provider. Ask your health care provider what activities are safe for you. General instructions You may need to take these actions to prevent or treat constipation: Drink enough fluid to keep your urine pale yellow. Take over-the-counter or prescription medicines. Eat foods that are high in fiber, such as beans, whole grains, and fresh fruits and vegetables. Limit foods that are high in fat and processed sugars, such as fried or sweet foods. Keep all follow-up visits. This is important. Where to find support If you have been taking opioids for a long time, you may benefit from receiving support for quitting from a local support group or counselor. Ask your health care provider for a referral to these resources in your area. Where to find more information Centers for Disease Control and Prevention (CDC): FootballExhibition.com.br [place] Food and Drug Administration (FDA): PumpkinSearch.com.ee Get help right away if: You may have taken too much of an opioid (overdosed). Common symptoms of an overdose: Your breathing is slower or more shallow than normal. You have a very slow heartbeat (  pulse). You have slurred speech. You have nausea and vomiting. Your pupils become very small. You have other potential symptoms: You are very confused. You faint or feel like you will faint. You have cold, clammy skin. You have blue lips or fingernails. You have thoughts of harming yourself or harming others. These symptoms may represent a serious problem that is an emergency. Do not wait to see if the symptoms will go away. Get medical help right away. Call your local emergency services (911 in the [place] Do not drive yourself to the hospital.  If you ever feel like you may hurt yourself or others, or have thoughts about taking your own life, get help right away. Go to your nearest  emergency department or: Call your local emergency services (911 in the [place] Call the [facility] ((403) 787-8766 in the [place] Call a suicide crisis helpline, such as the National Suicide Prevention Lifeline at 815-048-6602 or 988 in the [place] This is open 24 hours a day in the [place] Text the Crisis Text Line at 501-277-8338 (in the [place] Summary Opioid medicines can help you manage moderate to severe pain for a short period of time. A pain treatment plan is an agreement between you and your health care provider. Discuss the goals of your treatment, including how much pain you might expect to have and how you will manage the pain. If you think that you or someone else may have taken too much of an opioid, get medical help right away. This information is not intended to replace advice given to you by your health care provider. Make sure you discuss any questions you have with your health care provider. Document Revised: 10/14/2020 Document Reviewed: 07/01/2020 Elsevier Patient Education  2024 [employer]  Advanced directives: (Declined) Advance directive discussed with you today. Even though you declined this today, please call our office should you change your mind, and we can give you the proper paperwork for you to fill out.  Next Medicare Annual Wellness Visit scheduled for next year: Yes  Preventive Care 29 Years and Older, Female Preventive care refers to lifestyle choices and visits with your health care provider that can promote health and wellness. What does preventive care include? A yearly physical exam. This is also called an annual well check. Dental exams once or twice a year. Routine eye exams. Ask your health care provider how often you should have your eyes checked. Personal lifestyle choices, including: Daily care of your teeth and gums. Regular physical activity. Eating a healthy diet. Avoiding tobacco and drug use. Limiting alcohol use. Practicing  safe sex. Taking low-dose aspirin every day. Taking vitamin and mineral supplements as recommended by your health care provider. What happens during an annual well check? The services and screenings done by your health care provider during your annual well check will depend on your age, overall health, lifestyle risk factors, and family history of disease. Counseling  Your health care provider may ask you questions about your: Alcohol use. Tobacco use. Drug use. Emotional well-being. Home and relationship well-being. Sexual activity. Eating habits. History of falls. Memory and ability to understand (cognition). Work and work Astronomer. Reproductive health. Screening  You may have the following tests or measurements: Height, weight, and BMI. Blood pressure. Lipid and cholesterol levels. These may be checked every 5 years, or more frequently if you are over 30 years old. Skin check. Lung cancer screening. You may have this screening every year starting at age 8 if you have  a 30-pack-year history of smoking and currently smoke or have quit within the past 15 years. Fecal occult blood test (FOBT) of the stool. You may have this test every year starting at age 90. Flexible sigmoidoscopy or colonoscopy. You may have a sigmoidoscopy every 5 years or a colonoscopy every 10 years starting at age 102. Hepatitis C blood test. Hepatitis B blood test. Sexually transmitted disease (STD) testing. Diabetes screening. This is done by checking your blood sugar (glucose) after you have not eaten for a while (fasting). You may have this done every 1-3 years. Bone density scan. This is done to screen for osteoporosis. You may have this done starting at age 37. Mammogram. This may be done every 1-2 years. Talk to your health care provider about how often you should have regular mammograms. Talk with your health care provider about your test results, treatment options, and if necessary, the need for more  tests. Vaccines  Your health care provider may recommend certain vaccines, such as: Influenza vaccine. This is recommended every year. Tetanus, diphtheria, and acellular pertussis (Tdap, Td) vaccine. You may need a Td booster every 10 years. Zoster vaccine. You may need this after age 38. Pneumococcal 13-valent conjugate (PCV13) vaccine. One dose is recommended after age 73. Pneumococcal polysaccharide (PPSV23) vaccine. One dose is recommended after age 70. Talk to your health care provider about which screenings and vaccines you need and how often you need them. This information is not intended to replace advice given to you by your health care provider. Make sure you discuss any questions you have with your health care provider. Document Released: 04/17/2015 Document Revised: 12/09/2015 Document Reviewed: 01/20/2015 Elsevier Interactive Patient Education  2017 [employer].  Fall Prevention in the Home Falls can cause injuries. They can happen to people of all ages. There are many things you can do to make your home safe and to help prevent falls. What can I do on the outside of my home? Regularly fix the edges of walkways and driveways and fix any cracks. Remove anything that might make you trip as you walk through a door, such as a raised step or threshold. Trim any bushes or trees on the path to your home. Use bright outdoor lighting. Clear any walking paths of anything that might make someone trip, such as rocks or tools. Regularly check to see if handrails are loose or broken. Make sure that both sides of any steps have handrails. Any raised decks and porches should have guardrails on the edges. Have any leaves, snow, or ice cleared regularly. Use sand or salt on walking paths during winter. Clean up any spills in your garage right away. This includes oil or grease spills. What can I do in the bathroom? Use night lights. Install grab bars by the toilet and in the tub and shower.  Do not use towel bars as grab bars. Use non-skid mats or decals in the tub or shower. If you need to sit down in the shower, use a plastic, non-slip stool. Keep the floor dry. Clean up any water that spills on the floor as soon as it happens. Remove soap buildup in the tub or shower regularly. Attach bath mats securely with double-sided non-slip rug tape. Do not have throw rugs and other things on the floor that can make you trip. What can I do in the bedroom? Use night lights. Make sure that you have a light by your bed that is easy to reach. Do not use any  sheets or blankets that are too big for your bed. They should not hang down onto the floor. Have a firm chair that has side arms. You can use this for support while you get dressed. Do not have throw rugs and other things on the floor that can make you trip. What can I do in the kitchen? Clean up any spills right away. Avoid walking on wet floors. Keep items that you use a lot in easy-to-reach places. If you need to reach something above you, use a strong step stool that has a grab bar. Keep electrical cords out of the way. Do not use floor polish or wax that makes floors slippery. If you must use wax, use non-skid floor wax. Do not have throw rugs and other things on the floor that can make you trip. What can I do with my stairs? Do not leave any items on the stairs. Make sure that there are handrails on both sides of the stairs and use them. Fix handrails that are broken or loose. Make sure that handrails are as long as the stairways. Check any carpeting to make sure that it is firmly attached to the stairs. Fix any carpet that is loose or worn. Avoid having throw rugs at the top or bottom of the stairs. If you do have throw rugs, attach them to the floor with carpet tape. Make sure that you have a light switch at the top of the stairs and the bottom of the stairs. If you do not have them, ask someone to add them for you. What else  can I do to help prevent falls? Wear shoes that: Do not have high heels. Have rubber bottoms. Are comfortable and fit you well. Are closed at the toe. Do not wear sandals. If you use a stepladder: Make sure that it is fully opened. Do not climb a closed stepladder. Make sure that both sides of the stepladder are locked into place. Ask someone to hold it for you, if possible. Clearly mark and make sure that you can see: Any grab bars or handrails. First and last steps. Where the edge of each step is. Use tools that help you move around (mobility aids) if they are needed. These include: Canes. Walkers. Scooters. Crutches. Turn on the lights when you go into a dark area. Replace any light bulbs as soon as they burn out. Set up your furniture so you have a clear path. Avoid moving your furniture around. If any of your floors are uneven, fix them. If there are any pets around you, be aware of where they are. Review your medicines with your doctor. Some medicines can make you feel dizzy. This can increase your chance of falling. Ask your doctor what other things that you can do to help prevent falls. This information is not intended to replace advice given to you by your health care provider. Make sure you discuss any questions you have with your health care provider. Document Released: 01/15/2009 Document Revised: 08/27/2015 Document Reviewed: 04/25/2014 Elsevier Interactive Patient Education  2017 [employer].

## 2022-12-13 DIAGNOSIS — M25562 Pain in left knee: Secondary | ICD-10-CM | POA: Diagnosis not present

## 2022-12-13 DIAGNOSIS — M25511 Pain in right shoulder: Secondary | ICD-10-CM | POA: Diagnosis not present

## 2022-12-13 DIAGNOSIS — G894 Chronic pain syndrome: Secondary | ICD-10-CM | POA: Diagnosis not present

## 2022-12-13 DIAGNOSIS — M25552 Pain in left hip: Secondary | ICD-10-CM | POA: Diagnosis not present

## 2022-12-13 DIAGNOSIS — M25571 Pain in right ankle and joints of right foot: Secondary | ICD-10-CM | POA: Diagnosis not present

## 2022-12-13 DIAGNOSIS — M25551 Pain in right hip: Secondary | ICD-10-CM | POA: Diagnosis not present

## 2022-12-13 DIAGNOSIS — Z79891 Long term (current) use of opiate analgesic: Secondary | ICD-10-CM | POA: Diagnosis not present

## 2023-01-10 DIAGNOSIS — M25551 Pain in right hip: Secondary | ICD-10-CM | POA: Diagnosis not present

## 2023-01-10 DIAGNOSIS — M25552 Pain in left hip: Secondary | ICD-10-CM | POA: Diagnosis not present

## 2023-01-10 DIAGNOSIS — M25511 Pain in right shoulder: Secondary | ICD-10-CM | POA: Diagnosis not present

## 2023-01-10 DIAGNOSIS — Z79891 Long term (current) use of opiate analgesic: Secondary | ICD-10-CM | POA: Diagnosis not present

## 2023-01-10 DIAGNOSIS — G894 Chronic pain syndrome: Secondary | ICD-10-CM | POA: Diagnosis not present

## 2023-01-10 DIAGNOSIS — M25562 Pain in left knee: Secondary | ICD-10-CM | POA: Diagnosis not present

## 2023-01-10 DIAGNOSIS — M25571 Pain in right ankle and joints of right foot: Secondary | ICD-10-CM | POA: Diagnosis not present

## 2023-01-23 ENCOUNTER — Ambulatory Visit
Admission: RE | Admit: 2023-01-23 | Discharge: 2023-01-23 | Disposition: A | Discharge status: Home / Self Care | Payer: Medicare HMO | Source: Ambulatory Visit | Attending: Radiation Oncology | Admitting: Radiation Oncology

## 2023-01-23 ENCOUNTER — Other Ambulatory Visit: Payer: Self-pay | Admitting: Radiation Oncology

## 2023-01-23 ENCOUNTER — Inpatient Hospital Stay
Admission: RE | Admit: 2023-01-23 | Discharge: 2023-01-23 | Disposition: A | Discharge status: Home / Self Care | Payer: Medicare HMO | Source: Ambulatory Visit | Attending: Radiation Oncology | Admitting: Radiation Oncology

## 2023-01-23 DIAGNOSIS — Z170 Estrogen receptor positive status [ER+]: Secondary | ICD-10-CM

## 2023-01-23 DIAGNOSIS — D0511 Intraductal carcinoma in situ of right breast: Secondary | ICD-10-CM | POA: Diagnosis not present

## 2023-01-23 DIAGNOSIS — C50811 Malignant neoplasm of overlapping sites of right female breast: Secondary | ICD-10-CM | POA: Insufficient documentation

## 2023-01-23 DIAGNOSIS — R921 Mammographic calcification found on diagnostic imaging of breast: Secondary | ICD-10-CM | POA: Diagnosis not present

## 2023-01-23 DIAGNOSIS — R92323 Mammographic fibroglandular density, bilateral breasts: Secondary | ICD-10-CM | POA: Diagnosis not present

## 2023-01-31 ENCOUNTER — Encounter: Payer: Self-pay | Admitting: Family Medicine

## 2023-01-31 ENCOUNTER — Ambulatory Visit (INDEPENDENT_AMBULATORY_CARE_PROVIDER_SITE_OTHER): Payer: Medicare HMO | Admitting: Family Medicine

## 2023-01-31 VITALS — BP 128/70 | HR 74 | Ht 68.0 in | Wt 197.0 lb

## 2023-01-31 DIAGNOSIS — I10 Essential (primary) hypertension: Secondary | ICD-10-CM | POA: Diagnosis not present

## 2023-01-31 DIAGNOSIS — E785 Hyperlipidemia, unspecified: Secondary | ICD-10-CM

## 2023-01-31 MED ORDER — HYDROCHLOROTHIAZIDE 12.5 MG PO TABS: ORAL_TABLET | ORAL | 0 refills | Status: DC

## 2023-01-31 MED ORDER — HYDROCHLOROTHIAZIDE 12.5 MG PO TABS: ORAL_TABLET | ORAL | 1 refills | Status: DC

## 2023-01-31 NOTE — Progress Notes (Signed)
Date:  01/31/2023   Name:  Kaitlyn Ali   DOB:  [birth year]   MRN:  [id number]   Chief Complaint: Hypertension  Hypertension This is a chronic problem. The current episode started more than 1 year ago. The problem has been gradually improving since onset. The problem is controlled. Pertinent negatives include no anxiety, blurred vision, chest pain, headaches, malaise/fatigue, neck pain, orthopnea, palpitations, peripheral edema, PND, shortness of breath or sweats. There are no associated agents to hypertension. Risk factors for coronary artery disease include dyslipidemia. Past treatments include diuretics. The current treatment provides moderate improvement. There are no compliance problems.  There is no history of angina, kidney disease, CAD/MI, CVA, heart failure, left ventricular hypertrophy, PVD or retinopathy.    Lab Results  Component Value Date   NA 141 08/08/2022   K 4.6 08/08/2022   CO2 24 08/08/2022   GLUCOSE 107 (H) 08/08/2022   BUN 16 08/08/2022   CREATININE 0.94 08/08/2022   CALCIUM 10.4 (H) 08/08/2022   EGFR 67 08/08/2022   GFRNONAA 56 (L) 10/11/2017   Lab Results  Component Value Date   CHOL 267 (H) 08/08/2022   HDL 40 08/08/2022   LDLCALC 165 (H) 08/08/2022   TRIG 324 (H) 08/08/2022   CHOLHDL 5.6 (H) 02/16/2017   No results found for: "TSH" No results found for: "HGBA1C" Lab Results  Component Value Date   WBC 5.3 01/25/2022   HGB 15.0 01/25/2022   HCT 45.0 01/25/2022   MCV 93.9 01/25/2022   PLT 259 01/25/2022   Lab Results  Component Value Date   ALT 41 (H) 08/08/2022   AST 28 08/08/2022   ALKPHOS 74 08/08/2022   BILITOT 0.5 08/08/2022   Lab Results  Component Value Date   25OHVITD2 <1.0 10/11/2017   25OHVITD3 26 10/11/2017     Review of Systems  Constitutional:  Negative for fever and malaise/fatigue.  HENT:  Negative for tinnitus and trouble swallowing.   Eyes:  Negative for blurred vision and visual disturbance.  Respiratory:   Negative for cough, chest tightness, shortness of breath, wheezing and stridor.   Cardiovascular:  Negative for chest pain, palpitations, orthopnea, leg swelling and PND.  Gastrointestinal:  Negative for abdominal pain and blood in stool.  Genitourinary:  Negative for hematuria and vaginal bleeding.  Musculoskeletal:  Negative for neck pain.  Neurological:  Negative for headaches.    Patient Active Problem List   Diagnosis Date Noted   Statin myopathy 04/22/2022   Invasive ductal carcinoma of right breast (HCC) 11/11/2021   Family history of breast cancer 06/14/2021   Chronic pain of both knees (Secondary Area of Pain) (L>R) 10/11/2017   Chronic right shoulder pain (Primary Area of Pain) 10/11/2017   Chronic pain syndrome 10/11/2017   Long term current use of opiate analgesic 10/11/2017   Pharmacologic therapy 10/11/2017   Disorder of skeletal system 10/11/2017   Problems influencing health status 10/11/2017    Allergies  Allergen Reactions   Doxycycline Itching   Nsaids     Bothers stomach   Statins     Past Surgical History:  Procedure Laterality Date   BREAST BIOPSY Right 09/27/2021   UOQ mid lateral calcs, coil marker, DCIS   BREAST BIOPSY Right 09/27/2021   UOQ calcs anterior, X marker, DCIS   BREAST BIOPSY Left 09/27/2021   Distortion, ribbon marker, FIBROSIS AND USUAL DUCTAL HYPERPLASIA   BREAST LUMPECTOMY WITH NEEDLE LOCALIZATION Right 10/20/2021   Procedure: BREAST LUMPECTOMY WITH NEEDLE LOCALIZATION;  Surgeon:  Earline Mayotte, MD;  Location: [facility] OR[facility];  Service: General;  Laterality: Right;   LAPAROSCOPIC HYSTERECTOMY     supracervical hyst due to AUB; Dr. Barnabas Lister   TOE SURGERY     shaved bone in R) great toe   TONSILLECTOMY     wire placement Right 10/20/2021   bracking for lumpectomy    Social History   Tobacco Use   Smoking status: Former    Current packs/day: 0.00    Average packs/day: 0.3 packs/day for 15.0 years (3.8 ttl pk-yrs)    Types:  Cigarettes    Start date: 19    Quit date: 2013    Years since quitting: 11.8   Smokeless tobacco: Never   Tobacco comments:    Social smoker only  Vaping Use   Vaping status: Never Used  Substance Use Topics   Alcohol use: Not Currently   Drug use: No     Medication list has been reviewed and updated.  Current Meds  Medication Sig   diazepam (VALIUM) 5 MG tablet Take 5 mg by mouth 2 (two) times daily as needed for anxiety. Dr Evelene Croon   hydrochlorothiazide (HYDRODIURIL) 12.5 MG tablet TAKE 1 TABLET(12.5 MG) BY MOUTH DAILY       01/31/2023    9:05 AM 08/08/2022    9:26 AM 01/14/2022   11:09 AM 09/09/2021   10:33 AM  GAD 7 : Generalized Anxiety Score  Nervous, Anxious, on Edge 0 0 0 2  Control/stop worrying 0 0 0 1  Worry too much - different things 0 0 0 1  Trouble relaxing 0 0 0 1  Restless 0 0 0 0  Easily annoyed or irritable 0 0 0 0  Afraid - awful might happen 0 0 0 0  Total GAD 7 Score 0 0 0 5  Anxiety Difficulty Not difficult at all Not difficult at all Not difficult at all Not difficult at all       01/31/2023    9:05 AM 11/16/2022    2:00 PM 08/08/2022    9:26 AM  Depression screen PHQ 2/9  Decreased Interest 0 0 0  Down, Depressed, Hopeless 0 0 0  PHQ - 2 Score 0 0 0  Altered sleeping 0  0  Tired, decreased energy 0  0  Change in appetite 0  0  Feeling bad or failure about yourself  0  0  Trouble concentrating 0  0  Moving slowly or fidgety/restless 0  0  Suicidal thoughts 0  0  PHQ-9 Score 0  0  Difficult doing work/chores Not difficult at all  Not difficult at all    BP Readings from Last 3 Encounters:  01/31/23 128/70  08/08/22 128/78  03/07/22 (!) 159/98    Physical Exam Vitals and nursing note reviewed.  Constitutional:      Appearance: She is well-developed.  HENT:     Head: Normocephalic.     Right Ear: External ear normal.     Left Ear: External ear normal.  Eyes:     General: Lids are everted, no foreign bodies appreciated. No  scleral icterus.       Left eye: No foreign body or hordeolum.     Conjunctiva/sclera: Conjunctivae normal.     Right eye: Right conjunctiva is not injected.     Left eye: Left conjunctiva is not injected.     Pupils: Pupils are equal, round, and reactive to light.  Neck:     Thyroid: No thyromegaly.  Vascular: No JVD.     Trachea: No tracheal deviation.  Cardiovascular:     Rate and Rhythm: Normal rate and regular rhythm.     Heart sounds: Normal heart sounds. No murmur heard.    No friction rub. No gallop.  Pulmonary:     Effort: Pulmonary effort is normal. No respiratory distress.     Breath sounds: Normal breath sounds. No wheezing, rhonchi or rales.  Abdominal:     General: Bowel sounds are normal.     Palpations: Abdomen is soft. There is no mass.     Tenderness: There is no abdominal tenderness. There is no guarding or rebound.  Musculoskeletal:        General: No tenderness. Normal range of motion.     Cervical back: Normal range of motion and neck supple.  Lymphadenopathy:     Cervical: No cervical adenopathy.  Skin:    General: Skin is warm.     Findings: No rash.  Neurological:     Mental Status: She is alert and oriented to person, place, and time.     Cranial Nerves: No cranial nerve deficit.     Deep Tendon Reflexes: Reflexes normal.  Psychiatric:        Mood and Affect: Mood is not anxious or depressed.     Wt Readings from Last 3 Encounters:  01/31/23 197 lb (89.4 kg)  11/16/22 193 lb (87.5 kg)  08/08/22 197 lb (89.4 kg)    BP 128/70   Pulse 74   Ht 5\' 8"  (1.727 m)   Wt 197 lb (89.4 kg)   SpO2 98%   BMI 29.95 kg/m   Assessment and Plan:  1. Primary hypertension Chronic.  Controlled.  Stable.  Blood pressure 128/70.  Asymptomatic.  Tolerating medication well.  Continue hydrochlorothiazide 12.5 mg once a day.  Will check renal function panel for GFR and electrolytes.  Will recheck patient in 6 months. - hydrochlorothiazide (HYDRODIURIL) 12.5 MG  tablet; TAKE 1 TABLET(12.5 MG) BY MOUTH DAILY  Dispense: 90 tablet; Refill: 0 - Renal Function Panel  2. Dyslipidemia Chronic.  Controlled.  Stable.  We will continue with diet control pending lipid panel if elevated may consider initiating statin agent. - Lipid Panel With LDL/HDL Ratio    Elizabeth Sauer, MD

## 2023-01-31 NOTE — Patient Instructions (Signed)
..  mmc

## 2023-02-01 ENCOUNTER — Other Ambulatory Visit: Payer: Self-pay

## 2023-02-01 LAB — LIPID PANEL WITH LDL/HDL RATIO
Cholesterol, Total: 230 mg/dL — ABNORMAL HIGH (ref 100–199)
HDL: 37 mg/dL — ABNORMAL LOW (ref >39)
LDL Chol Calc (NIH): 155 mg/dL — ABNORMAL HIGH (ref 0–99)
LDL/HDL Ratio: 4.2 ratio — ABNORMAL HIGH (ref 0.0–3.2)
Triglycerides: 209 mg/dL — ABNORMAL HIGH (ref 0–149)
VLDL Cholesterol Cal: 38 mg/dL (ref 5–40)

## 2023-02-01 LAB — RENAL FUNCTION PANEL
Albumin: 4.5 g/dL (ref 3.9–4.9)
BUN/Creatinine Ratio: 17 (ref 12–28)
BUN: 20 mg/dL (ref 8–27)
CO2: 23 mmol/L (ref 20–29)
Calcium: 10.2 mg/dL (ref 8.7–10.3)
Chloride: 100 mmol/L (ref 96–106)
Creatinine, Ser: 1.15 mg/dL — ABNORMAL HIGH (ref 0.57–1.00)
Glucose: 100 mg/dL — ABNORMAL HIGH (ref 70–99)
Phosphorus: 3.4 mg/dL (ref 3.0–4.3)
Potassium: 4.2 mmol/L (ref 3.5–5.2)
Sodium: 140 mmol/L (ref 134–144)
eGFR: 53 mL/min/{1.73_m2} — ABNORMAL LOW (ref >59)

## 2023-02-01 MED ORDER — EZETIMIBE 10 MG PO TABS: 10.0000 mg | ORAL_TABLET | Freq: Every day | ORAL | 0 refills | Status: DC

## 2023-02-07 DIAGNOSIS — M25511 Pain in right shoulder: Secondary | ICD-10-CM | POA: Diagnosis not present

## 2023-02-07 DIAGNOSIS — G894 Chronic pain syndrome: Secondary | ICD-10-CM | POA: Diagnosis not present

## 2023-02-07 DIAGNOSIS — M25571 Pain in right ankle and joints of right foot: Secondary | ICD-10-CM | POA: Diagnosis not present

## 2023-02-07 DIAGNOSIS — M25552 Pain in left hip: Secondary | ICD-10-CM | POA: Diagnosis not present

## 2023-02-07 DIAGNOSIS — Z79891 Long term (current) use of opiate analgesic: Secondary | ICD-10-CM | POA: Diagnosis not present

## 2023-02-14 DIAGNOSIS — L821 Other seborrheic keratosis: Secondary | ICD-10-CM | POA: Diagnosis not present

## 2023-02-14 DIAGNOSIS — L570 Actinic keratosis: Secondary | ICD-10-CM | POA: Diagnosis not present

## 2023-03-07 DIAGNOSIS — G894 Chronic pain syndrome: Secondary | ICD-10-CM | POA: Diagnosis not present

## 2023-03-07 DIAGNOSIS — M25562 Pain in left knee: Secondary | ICD-10-CM | POA: Diagnosis not present

## 2023-03-07 DIAGNOSIS — M25552 Pain in left hip: Secondary | ICD-10-CM | POA: Diagnosis not present

## 2023-03-07 DIAGNOSIS — Z79891 Long term (current) use of opiate analgesic: Secondary | ICD-10-CM | POA: Diagnosis not present

## 2023-03-07 DIAGNOSIS — M25551 Pain in right hip: Secondary | ICD-10-CM | POA: Diagnosis not present

## 2023-03-07 DIAGNOSIS — M25571 Pain in right ankle and joints of right foot: Secondary | ICD-10-CM | POA: Diagnosis not present

## 2023-04-18 DIAGNOSIS — Z79891 Long term (current) use of opiate analgesic: Secondary | ICD-10-CM | POA: Diagnosis not present

## 2023-04-18 DIAGNOSIS — M25511 Pain in right shoulder: Secondary | ICD-10-CM | POA: Diagnosis not present

## 2023-04-18 DIAGNOSIS — M25571 Pain in right ankle and joints of right foot: Secondary | ICD-10-CM | POA: Diagnosis not present

## 2023-04-18 DIAGNOSIS — M25562 Pain in left knee: Secondary | ICD-10-CM | POA: Diagnosis not present

## 2023-04-18 DIAGNOSIS — G894 Chronic pain syndrome: Secondary | ICD-10-CM | POA: Diagnosis not present

## 2023-05-16 DIAGNOSIS — Z79891 Long term (current) use of opiate analgesic: Secondary | ICD-10-CM | POA: Diagnosis not present

## 2023-05-16 DIAGNOSIS — G894 Chronic pain syndrome: Secondary | ICD-10-CM | POA: Diagnosis not present

## 2023-05-16 DIAGNOSIS — M25511 Pain in right shoulder: Secondary | ICD-10-CM | POA: Diagnosis not present

## 2023-05-16 DIAGNOSIS — M25571 Pain in right ankle and joints of right foot: Secondary | ICD-10-CM | POA: Diagnosis not present

## 2023-05-16 DIAGNOSIS — M25562 Pain in left knee: Secondary | ICD-10-CM | POA: Diagnosis not present

## 2023-06-13 DIAGNOSIS — E559 Vitamin D deficiency, unspecified: Secondary | ICD-10-CM | POA: Diagnosis not present

## 2023-06-13 DIAGNOSIS — M25511 Pain in right shoulder: Secondary | ICD-10-CM | POA: Diagnosis not present

## 2023-06-13 DIAGNOSIS — M25551 Pain in right hip: Secondary | ICD-10-CM | POA: Diagnosis not present

## 2023-06-13 DIAGNOSIS — R5383 Other fatigue: Secondary | ICD-10-CM | POA: Diagnosis not present

## 2023-06-13 DIAGNOSIS — Z79891 Long term (current) use of opiate analgesic: Secondary | ICD-10-CM | POA: Diagnosis not present

## 2023-06-13 DIAGNOSIS — G894 Chronic pain syndrome: Secondary | ICD-10-CM | POA: Diagnosis not present

## 2023-06-13 DIAGNOSIS — D519 Vitamin B12 deficiency anemia, unspecified: Secondary | ICD-10-CM | POA: Diagnosis not present

## 2023-06-13 DIAGNOSIS — M25552 Pain in left hip: Secondary | ICD-10-CM | POA: Diagnosis not present

## 2023-06-13 LAB — HEMOGLOBIN A1C: Hemoglobin A1C: 6.1

## 2023-06-13 LAB — TSH: TSH: 1.45 (ref 0.41–5.90)

## 2023-06-13 LAB — VITAMIN D 25 HYDROXY (VIT D DEFICIENCY, FRACTURES): Vit D, 25-Hydroxy: 14

## 2023-06-13 LAB — COMPREHENSIVE METABOLIC PANEL WITH GFR
Albumin: 4.6 (ref 3.5–5.0)
Calcium: 10.2 (ref 8.7–10.7)
Globulin: 2.9
eGFR: 58

## 2023-06-13 LAB — BASIC METABOLIC PANEL WITH GFR
BUN: 22 — AB (ref 4–21)
CO2: 25 — AB (ref 13–22)
Chloride: 101 (ref 99–108)
Creatinine: 1.1 (ref 0.5–1.1)
Glucose: 103
Potassium: 3.9 meq/L (ref 3.5–5.1)
Sodium: 140 (ref 137–147)

## 2023-06-13 LAB — CBC AND DIFFERENTIAL
HCT: 46 (ref 36–46)
Hemoglobin: 15.4 (ref 12.0–16.0)
Platelets: 334 10*3/uL (ref 150–400)
WBC: 7.5

## 2023-06-13 LAB — HEPATIC FUNCTION PANEL
ALT: 32 U/L (ref 7–35)
AST: 20 (ref 13–35)
Alkaline Phosphatase: 60 (ref 25–125)

## 2023-06-13 LAB — CBC: RBC: 5.01 (ref 3.87–5.11)

## 2023-06-13 LAB — TESTOSTERONE: Testosterone: 1.8

## 2023-06-21 ENCOUNTER — Other Ambulatory Visit: Payer: Self-pay | Admitting: Radiation Oncology

## 2023-06-21 DIAGNOSIS — R921 Mammographic calcification found on diagnostic imaging of breast: Secondary | ICD-10-CM

## 2023-06-21 DIAGNOSIS — R928 Other abnormal and inconclusive findings on diagnostic imaging of breast: Secondary | ICD-10-CM

## 2023-07-04 ENCOUNTER — Other Ambulatory Visit: Payer: Self-pay | Admitting: Family Medicine

## 2023-07-04 DIAGNOSIS — I10 Essential (primary) hypertension: Secondary | ICD-10-CM

## 2023-07-04 NOTE — Telephone Encounter (Signed)
 Copied from CRM [id number]. Topic: Clinical - Medication Refill >> Jul 04, 2023  3:45 PM Ernst Spell wrote: Most Recent Primary Care Visit:  Provider: Duanne Limerick  Department: [facility]  Visit Type: OFFICE VISIT  Date: 01/31/2023  Medication: hydrochlorothiazide  Has the patient contacted their pharmacy? Yes Claimed they have reached out  Is this the correct pharmacy for this prescription? Yes If no, delete pharmacy and type the correct one.  This is the patient's preferred pharmacy:  [id number] - [place], [place] - [address] AT [facility] [address] [place] [address] [place] [address] Phone: [phone] Fax: [phone]   Has the prescription been filled recently? Yes  Is the patient out of the medication? No  Has the patient been seen for an appointment in the last year OR does the patient have an upcoming appointment? No  Can we respond through MyChart? Yes  Agent: Please be advised that Rx refills may take up to 3 business days. We ask that you follow-up with your pharmacy.

## 2023-07-06 NOTE — Telephone Encounter (Signed)
 Requested Prescriptions  Refused Prescriptions Disp Refills   hydrochlorothiazide (HYDRODIURIL) 12.5 MG tablet 90 tablet 1    Sig: TAKE 1 TABLET(12.5 MG) BY MOUTH DAILY     Cardiovascular: Diuretics - Thiazide Failed - 07/06/2023 11:57 AM      Failed - Cr in normal range and within 180 days    Creatinine, Ser  Date Value Ref Range Status  01/31/2023 1.15 (H) 0.57 - 1.00 mg/dL Final         Failed - Valid encounter within last 6 months    Recent Outpatient Visits   None     Future Appointments             In 1 week Duanne Limerick, MD [facility] at [facility]            Passed - K in normal range and within 180 days    Potassium  Date Value Ref Range Status  01/31/2023 4.2 3.5 - 5.2 mmol/L Final         Passed - Na in normal range and within 180 days    Sodium  Date Value Ref Range Status  01/31/2023 140 134 - 144 mmol/L Final         Passed - Last BP in normal range    BP Readings from Last 1 Encounters:  01/31/23 128/70

## 2023-07-11 DIAGNOSIS — M25511 Pain in right shoulder: Secondary | ICD-10-CM | POA: Diagnosis not present

## 2023-07-11 DIAGNOSIS — G894 Chronic pain syndrome: Secondary | ICD-10-CM | POA: Diagnosis not present

## 2023-07-11 DIAGNOSIS — M25571 Pain in right ankle and joints of right foot: Secondary | ICD-10-CM | POA: Diagnosis not present

## 2023-07-11 DIAGNOSIS — M25552 Pain in left hip: Secondary | ICD-10-CM | POA: Diagnosis not present

## 2023-07-11 DIAGNOSIS — Z79891 Long term (current) use of opiate analgesic: Secondary | ICD-10-CM | POA: Diagnosis not present

## 2023-07-13 ENCOUNTER — Encounter: Payer: Self-pay | Admitting: Family Medicine

## 2023-07-13 ENCOUNTER — Ambulatory Visit (INDEPENDENT_AMBULATORY_CARE_PROVIDER_SITE_OTHER): Payer: Self-pay | Admitting: Family Medicine

## 2023-07-13 VITALS — BP 124/82 | HR 87 | Ht 68.0 in | Wt 202.0 lb

## 2023-07-13 DIAGNOSIS — E785 Hyperlipidemia, unspecified: Secondary | ICD-10-CM

## 2023-07-13 DIAGNOSIS — I10 Essential (primary) hypertension: Secondary | ICD-10-CM | POA: Diagnosis not present

## 2023-07-13 MED ORDER — HYDROCHLOROTHIAZIDE 12.5 MG PO TABS: ORAL_TABLET | ORAL | 1 refills | Status: DC

## 2023-07-13 MED ORDER — EZETIMIBE 10 MG PO TABS: 10.0000 mg | ORAL_TABLET | Freq: Every day | ORAL | 1 refills | Status: DC

## 2023-07-13 NOTE — Progress Notes (Signed)
 Date:  07/13/2023   Name:  Kaitlyn Ali   DOB:  Jul 19, 1955   MRN:  [id number]   Chief Complaint: Hypertension  Hypertension This is a chronic problem. The current episode started more than 1 year ago. The problem has been waxing and waning since onset. The problem is controlled. Pertinent negatives include no anxiety, blurred vision, chest pain, headaches, malaise/fatigue, neck pain, orthopnea, palpitations, peripheral edema, PND, shortness of breath or sweats. There are no associated agents to hypertension. There are no known risk factors for coronary artery disease. Past treatments include diuretics. The current treatment provides moderate improvement. There are no compliance problems.  There is no history of CAD/MI or CVA. There is no history of chronic renal disease, a hypertension causing med or renovascular disease.    Lab Results  Component Value Date   NA 140 01/31/2023   K 4.2 01/31/2023   CO2 23 01/31/2023   GLUCOSE 100 (H) 01/31/2023   BUN 20 01/31/2023   CREATININE 1.15 (H) 01/31/2023   CALCIUM 10.2 01/31/2023   EGFR 53 (L) 01/31/2023   GFRNONAA 56 (L) 10/11/2017   Lab Results  Component Value Date   CHOL 230 (H) 01/31/2023   HDL 37 (L) 01/31/2023   LDLCALC 155 (H) 01/31/2023   TRIG 209 (H) 01/31/2023   CHOLHDL 5.6 (H) 02/16/2017   No results found for: "TSH" No results found for: "HGBA1C" Lab Results  Component Value Date   WBC 5.3 01/25/2022   HGB 15.0 01/25/2022   HCT 45.0 01/25/2022   MCV 93.9 01/25/2022   PLT 259 01/25/2022   Lab Results  Component Value Date   ALT 41 (H) 08/08/2022   AST 28 08/08/2022   ALKPHOS 74 08/08/2022   BILITOT 0.5 08/08/2022   Lab Results  Component Value Date   25OHVITD2 <1.0 10/11/2017   25OHVITD3 26 10/11/2017     Review of Systems  Constitutional:  Negative for malaise/fatigue.  Eyes:  Negative for blurred vision.  Respiratory:  Negative for chest tightness, shortness of breath and wheezing.    Cardiovascular:  Negative for chest pain, palpitations, orthopnea, leg swelling and PND.  Gastrointestinal:  Negative for abdominal distention, abdominal pain and anal bleeding.  Endocrine: Negative for polydipsia and polyuria.  Musculoskeletal:  Negative for neck pain.  Neurological:  Negative for headaches.    Patient Active Problem List   Diagnosis Date Noted   Statin myopathy 04/22/2022   Invasive ductal carcinoma of right breast (HCC) 11/11/2021   Family history of breast cancer 06/14/2021   Chronic pain of both knees (Secondary Area of Pain) (L>R) 10/11/2017   Chronic right shoulder pain (Primary Area of Pain) 10/11/2017   Chronic pain syndrome 10/11/2017   Long term current use of opiate analgesic 10/11/2017   Pharmacologic therapy 10/11/2017   Disorder of skeletal system 10/11/2017   Problems influencing health status 10/11/2017    Allergies  Allergen Reactions   Doxycycline Itching   Nsaids     Bothers stomach   Statins     Past Surgical History:  Procedure Laterality Date   BREAST BIOPSY Right 09/27/2021   UOQ mid lateral calcs, coil marker, DCIS   BREAST BIOPSY Right 09/27/2021   UOQ calcs anterior, X marker, DCIS   BREAST BIOPSY Left 09/27/2021   Distortion, ribbon marker, FIBROSIS AND USUAL DUCTAL HYPERPLASIA   BREAST LUMPECTOMY WITH NEEDLE LOCALIZATION Right 10/20/2021   Procedure: BREAST LUMPECTOMY WITH NEEDLE LOCALIZATION;  Surgeon: Earline Mayotte, MD;  Location: [facility];  Service: General;  Laterality: Right;   LAPAROSCOPIC HYSTERECTOMY     supracervical hyst due to AUB; Dr. Barnabas Lister   TOE SURGERY     shaved bone in R) great toe   TONSILLECTOMY     wire placement Right 10/20/2021   bracking for lumpectomy    Social History   Tobacco Use   Smoking status: Former    Current packs/day: 0.00    Average packs/day: 0.3 packs/day for 15.0 years (3.8 ttl pk-yrs)    Types: Cigarettes    Start date: 54    Quit date: 2013    Years since  quitting: 12.2   Smokeless tobacco: Never   Tobacco comments:    Social smoker only  Vaping Use   Vaping status: Never Used  Substance Use Topics   Alcohol use: Not Currently   Drug use: No     Medication list has been reviewed and updated.  Current Meds  Medication Sig   diazepam (VALIUM) 5 MG tablet Take 5 mg by mouth 2 (two) times daily as needed for anxiety. Dr Evelene Croon   ezetimibe (ZETIA) 10 MG tablet Take 1 tablet (10 mg total) by mouth daily.   hydrochlorothiazide (HYDRODIURIL) 12.5 MG tablet TAKE 1 TABLET(12.5 MG) BY MOUTH DAILY   oxyCODONE-acetaminophen (PERCOCET) 10-325 MG tablet Take 1 tablet by mouth 4 (four) times daily.       07/13/2023   10:01 AM 01/31/2023    9:05 AM 08/08/2022    9:26 AM 01/14/2022   11:09 AM  GAD 7 : Generalized Anxiety Score  Nervous, Anxious, on Edge 1 0 0 0  Control/stop worrying 1 0 0 0  Worry too much - different things 1 0 0 0  Trouble relaxing 0 0 0 0  Restless 0 0 0 0  Easily annoyed or irritable 0 0 0 0  Afraid - awful might happen 0 0 0 0  Total GAD 7 Score 3 0 0 0  Anxiety Difficulty Not difficult at all Not difficult at all Not difficult at all Not difficult at all       07/13/2023   10:00 AM 01/31/2023    9:05 AM 11/16/2022    2:00 PM  Depression screen PHQ 2/9  Decreased Interest 0 0 0  Down, Depressed, Hopeless 0 0 0  PHQ - 2 Score 0 0 0  Altered sleeping 3 0   Tired, decreased energy 3 0   Change in appetite 3 0   Feeling bad or failure about yourself  0 0   Trouble concentrating 0 0   Moving slowly or fidgety/restless 0 0   Suicidal thoughts 0 0   PHQ-9 Score 9 0   Difficult doing work/chores Not difficult at all Not difficult at all     BP Readings from Last 3 Encounters:  07/13/23 124/82  01/31/23 128/70  08/08/22 128/78    Physical Exam Vitals and nursing note reviewed.  Constitutional:      General: She is not in acute distress.    Appearance: She is not diaphoretic.  HENT:     Head: Normocephalic  and atraumatic.     Right Ear: Tympanic membrane and external ear normal.     Left Ear: Tympanic membrane and external ear normal.     Nose: Nose normal. No congestion or rhinorrhea.     Mouth/Throat:     Mouth: Mucous membranes are dry.  Eyes:     General:        Right eye: No  discharge.        Left eye: No discharge.     Conjunctiva/sclera: Conjunctivae normal.     Pupils: Pupils are equal, round, and reactive to light.  Neck:     Thyroid: No thyromegaly.     Vascular: No JVD.  Cardiovascular:     Rate and Rhythm: Normal rate and regular rhythm.     Heart sounds: Normal heart sounds. No murmur heard.    No friction rub. No gallop.  Pulmonary:     Effort: Pulmonary effort is normal.     Breath sounds: Normal breath sounds. No wheezing, rhonchi or rales.  Abdominal:     General: Bowel sounds are normal.     Palpations: Abdomen is soft. There is no mass.     Tenderness: There is no abdominal tenderness. There is no guarding.  Musculoskeletal:        General: Normal range of motion.     Cervical back: Normal range of motion and neck supple.  Lymphadenopathy:     Cervical: No cervical adenopathy.  Skin:    General: Skin is warm and dry.  Neurological:     Mental Status: She is alert.     Deep Tendon Reflexes: Reflexes are normal and symmetric.     Wt Readings from Last 3 Encounters:  07/13/23 202 lb (91.6 kg)  01/31/23 197 lb (89.4 kg)  11/16/22 193 lb (87.5 kg)    BP 124/82   Pulse 87   Ht 5\' 8"  (1.727 m)   Wt 202 lb (91.6 kg)   SpO2 97%   BMI 30.71 kg/m   Assessment and Plan: 1. Primary hypertension (Primary) Chronic.  Controlled.  Stable.  Asymptomatic.  Tolerating medication well.  Blood pressure 124/82.  Will continue hydrochlorothiazide 12.5 mg once a day.  On review of labs that patient brought from another physician electrolytes and GFR acceptable range. - hydrochlorothiazide (HYDRODIURIL) 12.5 MG tablet; TAKE 1 TABLET(12.5 MG) BY MOUTH DAILY  Dispense: 90  tablet; Refill: 1  2. Dyslipidemia Chronic.  Controlled.  Stable.  Continue Zetia 10 mg once a day.  I have also reemphasized low-cholesterol low triglyceride dietary guidelines and will recheck patient in 6 months.  In the meantime patient has gone to do better with her carbohydrates as well as her concentrated sweets as well. - ezetimibe (ZETIA) 10 MG tablet; Take 1 tablet (10 mg total) by mouth daily.  Dispense: 90 tablet; Refill: 1     Elizabeth Sauer, MD

## 2023-07-13 NOTE — Patient Instructions (Addendum)
 Chronic.  Controlled.  Stable.  GUIDELINES FOR  LOW-CHOLESTEROL, LOW-TRIGLYCERIDE DIETS    FOODS TO USE   MEATS, FISH Choose lean meats (chicken, [place], veal, and non-fatty cuts of beef with excess fat trimmed; one serving = 3 oz of cooked meat). Also, fresh or frozen fish, canned fish packed in water, and shellfish (lobster, crabs, shrimp, and oysters). Limit use to no more than one serving of one of these per week. Shellfish are high in cholesterol but low in saturated fat and should be used sparingly. Meats and fish should be broiled (pan or oven) or baked on a rack.  EGGS Egg substitutes and egg whites (use freely). Egg yolks (limit two per week).  FRUITS Eat three servings of fresh fruit per day (1 serving =  cup). Be sure to have at least one citrus fruit daily. Frozen and canned fruit with no sugar or syrup added may be used.  VEGETABLES Most vegetables are not limited (see next page). One dark-green (string beans, escarole) or one deep yellow (squash) vegetable is recommended daily. Cauliflower, broccoli, and celery, as well as potato skins, are recommended for their fiber content. (Fiber is associated with cholesterol reduction) It is preferable to steam vegetables, but they may be boiled, strained, or braised with polyunsaturated vegetable oil (see below).  BEANS Dried peas or beans (1 serving =  cup) may be used as a bread substitute.  NUTS Almonds, walnuts, and peanuts may be used sparingly  (1 serving = 1 Tablespoonful). Use pumpkin, sesame, or sunflower seeds.  BREADS, GRAINS One roll or one slice of whole grain or enriched bread may be used, or three soda crackers or four pieces of melba toast as a substitute. Spaghetti, rice or noodles ( cup) or  large ear of corn may be used as a bread substitute. In preparing these foods do not use butter or shortening, use soft margarine. Also use egg and sugar substitutes.  Choose high fiber grains, such as oats and whole wheat.  CEREALS Use   cup of hot cereal or  cup of cold cereal per day. Add a sugar substitute if desired, with 99% fat free or skim milk.  MILK PRODUCTS Always use 99% fat free or skim milk, dairy products such as low fat cheeses (farmer's uncreamed diet cottage), low-fat yogurt, and powdered skim milk.  FATS, OILS Use soft (not stick) margarine; vegetable oils that are high in polyunsaturated fats (such as safflower, sunflower, soybean, corn, and cottonseed). Always refrigerate meat drippings to harden the fat and remove it before preparing gravies  DESSERTS, SNACKS Limit to two servings per day; substitute each serving for a bread/cereal serving: ice milk, water sherbet (1/4 cup); unflavored gelatin or gelatin flavored with sugar substitute (1/3 cup); pudding prepared with skim milk (1/2 cup); egg white souffls; unbuttered popcorn (1  cups). Substitute carob for chocolate.  BEVERAGES Fresh fruit juices (limit 4 oz per day); black coffee, plain or herbal teas; soft drinks with sugar substitutes; club soda, preferably salt-free; cocoa made with skim milk or nonfat dried milk and water (sugar substitute added if desired); clear broth. Alcohol: limit two servings per day (see second page).  MISCELLANEOUS  You may use the following freely: vinegar, spices, herbs, nonfat bouillon, mustard, Worcestershire sauce, soy sauce, flavoring essence.                  GUIDELINES FOR  LOW-CHOLESTEROL, LOW TRIGLYCERIDE DIETS    FOODS TO AVOID   MEATS, FISH Marbled beef, pork, bacon,  sausage, and other pork products; fatty fowl (duck, goose); skin and fat of [place] and chicken; processed meats; luncheon meats (salami, bologna); frankfurters and fast-food hamburgers (theyre loaded with fat); organ meats (kidneys, liver); canned fish packed in oil.  EGGS Limit egg yolks to two per week.   FRUITS Coconuts (rich in saturated fats).  VEGETABLES Avoid avocados. Starchy vegetables (potatoes, corn, lima beans, dried peas, beans)  may be used only if substitutes for a serving of bread or cereal. (Baked potato skin, however, is desirable for its fiber content.  BEANS Commercial baked beans with sugar and/or pork added.  NUTS Avoid nuts.  Limit peanuts and walnuts to one tablespoonful per day.  BREADS, GRAINS Any baked goods with shortening and/or sugar. Commercial mixes with dried eggs and whole milk. Avoid sweet rolls, doughnuts, breakfast pastries (Danish), and sweetened packaged cereals (the added sugar converts readily to triglycerides).  MILK PRODUCTS Whole milk and whole-milk packaged goods; cream; ice cream; whole-milk puddings, yogurt, or cheeses; nondairy cream substitutes.  FATS, OILS Butter, lard, animal fats, bacon drippings, gravies, cream sauces as well as palm and coconut oils. All these are high in saturated fats. Examine labels on cholesterol free products for hydrogenated fats. (These are oils that have been hardened into solids and in the process have become saturated.)  DESSERTS, SNACKS Fried snack foods like potato chips; chocolate; candies in general; jams, jellies, syrups; whole- milk puddings; ice cream and milk sherbets; hydrogenated peanut butter.  BEVERAGES Sugared fruit juices and soft drinks; cocoa made with whole milk and/or sugar. When using alcohol (1 oz liquor, 5 oz beer, or 2  oz dry table wine per serving), one serving must be substituted for one bread or cereal serving (limit, two servings of alcohol per day).   SPECIAL NOTES    Remember that even non-limited foods should be used in moderation. While on a cholesterol-lowering diet, be sure to avoid animal fats and marbled meats. 3. While on a triglyceride-lowering diet, be sure to avoid sweets and to control the amount of carbohydrates you eat (starchy foods such as flour, bread, potatoes).While on a tri-glyceride-lowering diet, be sure to avoid sweets Buy a good low-fat cookbook, such as the one published by the American Heart  Association. Consult your physician if you have any questions.               [facility] Low Glycemic Diet Plan   Low Glycemic Foods (20-49) Moderate Glycemic Foods (50-69) High Glycemic Foods (70-100)      Breakfast Creals Breakfast Cereals Breakfast Cereals  All Bran All-Bran Fruit'n Oats   Bran Buds Bran Chex   Cheerios Corn chex    Fiber One Oatmeal (not instant)   Just Right Mini-Wheats   Corn Flakes Cream of Wheat    Oat Bran Special K Swiss Muesli   Grape Nuts Grape Nut Flakes      Grits Nutri-Grain    Fruits and fruit juice: Fruits Puffed Rice Puffed Wheat    (Limit to 1-2 Servings per day) Banana (under-ride) Dates   Rice Chex Rice Krispies    Apples Apricots (fresh/dried)   Figs Grapes   Shredded Wheat Team    Blackberries Blueberries   Kiwi Mango   Total     Cherries Cranberries   Oranges Raisins     Peaches Pears    Fruits  Plums Prunes   Fruit Juices Pineapple Watermelon    Grapefruit Raspberries   Cranberry Juice Orange Juice   Banana (over-ripe)  Strawberries Tangerines      Apple Juice Grapefruit Juice   Beans and Legumes Beverages  Tomato Juice    [place]-type baked beans Sodas, sweet tea, pineapple juice   Canned pinto, kidney, or navy beans   Beans and Legumes (fresh-cooked) Green peas Vegetables  Black-eyed peas Butter Beans    Potato, baked, boiled, fried, mashed  Chick peas Lentils   Vegetables [place] fries  Green beans Lima beans   Beets Carrots   Canned or frozen corn  Kidney beans Navy beans   Sweet potato Yam   Parsnips  Pinto beans Snow peas   Corn on the cob Winter squash      Non-starchy vegetables Grains Breads  Asparagus, avocado, broccoli, cabbage Cornmeal Rice, brown   Most breads (white and whole grain)  cauliflower, celery, cucumber, greens Rice, white Couscous   Bagels Bread sticks    lettuce, mushrooms, peppers, tomatoes  Bread stuffing Kaiser roll    okra, onions, spinach, summer  squash Pasta Dinner rolls   [employer], cheese     Grains Ravioli, meat filled Spaghetti, white   Grains  Barley Bulgur    Rice, instant Tapioca, with milk    Rye Wild rice   Nuts    Cashews Macadamia   Candy and most cookies  Nuts and oils    Almonds, peanuts, sunflower seeds Snacks Snacks  hazelnuts, pecans, walnuts Chocolate Ice cream, lowfat   Donuts Corn chips    Oils that are liquid at room temperature Muffin Popcorn   Jelly beans Pretzels      Pastries  Dairy, fish, meat, soy, and eggs    Milk, skim Lowfat cheese    Restaurant and ethnic foods  Yogurt, lowfat, fruit sugar sweetened  Most [place] food (sugar in stir fry    or wok sauce)  Lean red meat Fish    Teriyaki-style meats and vegetables  Skinless chicken and [place], shellfish        Egg whites (up to 3 daily), Soy Products    Egg yolks (up to 7 or _____ per week)

## 2023-07-14 ENCOUNTER — Encounter: Payer: Self-pay | Admitting: Family Medicine

## 2023-08-08 DIAGNOSIS — M25571 Pain in right ankle and joints of right foot: Secondary | ICD-10-CM | POA: Diagnosis not present

## 2023-08-08 DIAGNOSIS — M25552 Pain in left hip: Secondary | ICD-10-CM | POA: Diagnosis not present

## 2023-08-08 DIAGNOSIS — M25511 Pain in right shoulder: Secondary | ICD-10-CM | POA: Diagnosis not present

## 2023-08-08 DIAGNOSIS — G894 Chronic pain syndrome: Secondary | ICD-10-CM | POA: Diagnosis not present

## 2023-08-08 DIAGNOSIS — Z79891 Long term (current) use of opiate analgesic: Secondary | ICD-10-CM | POA: Diagnosis not present

## 2023-09-06 DIAGNOSIS — Z79891 Long term (current) use of opiate analgesic: Secondary | ICD-10-CM | POA: Diagnosis not present

## 2023-09-06 DIAGNOSIS — G894 Chronic pain syndrome: Secondary | ICD-10-CM | POA: Diagnosis not present

## 2023-11-22 ENCOUNTER — Ambulatory Visit (INDEPENDENT_AMBULATORY_CARE_PROVIDER_SITE_OTHER): Payer: Self-pay | Admitting: Emergency Medicine

## 2023-11-22 VITALS — Ht 68.0 in | Wt 189.0 lb

## 2023-11-22 DIAGNOSIS — Z0000 Encounter for general adult medical examination without abnormal findings: Secondary | ICD-10-CM | POA: Diagnosis not present

## 2023-11-22 NOTE — Patient Instructions (Addendum)
 Ms. Kaitlyn Ali , Thank you for taking time out of your busy schedule to complete your Annual Wellness Visit with me. I enjoyed our conversation and look forward to speaking with you again next year. I, as well as your care team,  appreciate your ongoing commitment to your health goals. Please review the following plan we discussed and let me know if I can assist you in the future. Your Game plan/ To Do List    Referrals: None   Follow up Visits: We will see or speak with you next year for your Next Medicare AWV with our clinical staff Have you seen your provider in the last 6 months (3 months if uncontrolled diabetes)? Yes  Clinician Recommendations:  Aim for 30 minutes of exercise or brisk walking, 6-8 glasses of water, and 5 servings of fruits and vegetables each day. Please call to schedule your 6 mth diagnostic mammogram as soon as possible. There are several food pantries in your area.       Please call to schedule your mammogram:  [facility] Address: [address], [place], [place] Phone: [phone]  [facility] [address], [address] [place], [place] [address] Phone: [phone]     This is a list of the screenings recommended for you:  Health Maintenance  Topic Date Due   COVID-19 Vaccine (1) Never done   Hepatitis C Screening  Never done   DTaP/Tdap/Td vaccine (1 - Tdap) Never done   Pneumococcal Vaccine for age over 45 (1 of 2 - PCV) Never done   Zoster (Shingles) Vaccine (1 of 2) Never done   Colon Cancer Screening  04/05/2023   Mammogram  07/24/2023   Flu Shot  11/03/2023   Medicare Annual Wellness Visit  11/21/2024   DEXA scan (bone density measurement)  08/10/2026   HPV Vaccine  Aged Out   Meningitis B Vaccine  Aged Out    Advanced directives: (ACP Link)Information on Advanced Care Planning can be found at [place]  Secretary of [facility] Advance Health Care Directives Advance Health  Care Directives. [email or web address] You may also get the forms at your doctor's office. Advance Care Planning is important because it:  [x]  Makes sure you receive the medical care that is consistent with your values, goals, and preferences  [x]  It provides guidance to your family and loved ones and reduces their decisional burden about whether or not they are making the right decisions based on your wishes.  Follow the link provided in your after visit summary or read over the paperwork we have mailed to you to help you started getting your Advance Directives in place. If you need assistance in completing these, please reach out to us  so that we can help you!  See attachments for Preventive Care and Fall Prevention Tips.   If you need help finding resources in your community and don't know where to turn, [place] 211 is the place to start. [employer]'s [place] 211 is an information and referral service that connects [place]ians to verified resources for basic needs, such as housing and utility assistance, food, healthcare, transportation, and more.  Anyone in [place]  can make a free and confidential call to 2-1-1 (or [phone]). A trained [employer] will listen, identify needs, and connect callers to local resources that fit their situation. Available in most languages, [place] 211 is open 24 hours a day, 7 days a week, 365 days a year. For those unable  to make a phone call, the search tool on [place]211.org provides detailed and verified information on resources across the state.  Fall Prevention in the Home, Adult Falls can cause injuries and affect people of all ages. There are many simple things that you can do to make your home safe and to help prevent falls. If you need it, ask for help making these changes. What actions can I take to prevent falls? General information Use good lighting in all rooms. Make sure to: Replace any light bulbs that burn out. Turn on lights if it is dark  and use night-lights. Keep items that you use often in easy-to-reach places. Lower the shelves around your home if needed. Move furniture so that there are clear paths around it. Do not keep throw rugs or other things on the floor that can make you trip. If any of your floors are uneven, fix them. Add color or contrast paint or tape to clearly mark and help you see: Grab bars or handrails. First and last steps of staircases. Where the edge of each step is. If you use a ladder or stepladder: Make sure that it is fully opened. Do not climb a closed ladder. Make sure the sides of the ladder are locked in place. Have someone hold the ladder while you use it. Know where your pets are as you move through your home. What can I do in the bathroom?     Keep the floor dry. Clean up any water that is on the floor right away. Remove soap buildup in the bathtub or shower. Buildup makes bathtubs and showers slippery. Use non-skid mats or decals on the floor of the bathtub or shower. Attach bath mats securely with double-sided, non-slip rug tape. If you need to sit down while you are in the shower, use a non-slip stool. Install grab bars by the toilet and in the bathtub and shower. Do not use towel bars as grab bars. What can I do in the bedroom? Make sure that you have a light by your bed that is easy to reach. Do not use any sheets or blankets on your bed that hang to the floor. Have a firm bench or chair with side arms that you can use for support when you get dressed. What can I do in the kitchen? Clean up any spills right away. If you need to reach something above you, use a sturdy step stool that has a grab bar. Keep electrical cables out of the way. Do not use floor polish or wax that makes floors slippery. What can I do with my stairs? Do not leave anything on the stairs. Make sure that you have a light switch at the top and the bottom of the stairs. Have them installed if you do not have  them. Make sure that there are handrails on both sides of the stairs. Fix handrails that are broken or loose. Make sure that handrails are as long as the staircases. Install non-slip stair treads on all stairs in your home if they do not have carpet. Avoid having throw rugs at the top or bottom of stairs, or secure the rugs with carpet tape to prevent them from moving. Choose a carpet design that does not hide the edge of steps on the stairs. Make sure that carpet is firmly attached to the stairs. Fix any carpet that is loose or worn. What can I do on the outside of my home? Use bright outdoor lighting. Repair the edges of  walkways and driveways and fix any cracks. Clear paths of anything that can make you trip, such as tools or rocks. Add color or contrast paint or tape to clearly mark and help you see high doorway thresholds. Trim any bushes or trees on the main path into your home. Check that handrails are securely fastened and in good repair. Both sides of all steps should have handrails. Install guardrails along the edges of any raised decks or porches. Have leaves, snow, and ice cleared regularly. Use sand, salt, or ice melt on walkways during winter months if you live where there is ice and snow. In the garage, clean up any spills right away, including grease or oil spills. What other actions can I take? Review your medicines with your health care provider. Some medicines can make you confused or feel dizzy. This can increase your chance of falling. Wear closed-toe shoes that fit well and support your feet. Wear shoes that have rubber soles and low heels. Use a cane, walker, scooter, or crutches that help you move around if needed. Talk with your provider about other ways that you can decrease your risk of falls. This may include seeing a physical therapist to learn to do exercises to improve movement and strength. Where to find more information Centers for Disease Control and Prevention,  STEADI: TonerPromos.no [employer] on Aging: BaseRingTones.pl National Institute on Aging: BaseRingTones.pl Contact a health care provider if: You are afraid of falling at home. You feel weak, drowsy, or dizzy at home. You fall at home. Get help right away if you: Lose consciousness or have trouble moving after a fall. Have a fall that causes a head injury. These symptoms may be an emergency. Get help right away. Call 911. Do not wait to see if the symptoms will go away. Do not drive yourself to the hospital. This information is not intended to replace advice given to you by your health care provider. Make sure you discuss any questions you have with your health care provider. Document Revised: 11/22/2021 Document Reviewed: 11/22/2021 Elsevier Patient Education  2024 [employer].

## 2023-11-22 NOTE — Progress Notes (Signed)
 Subjective:   Kaitlyn Ali is a 68 y.o. who presents for a Medicare Wellness preventive visit.  As a reminder, Annual Wellness Visits don't include a physical exam, and some assessments may be limited, especially if this visit is performed virtually. We may recommend an in-person follow-up visit with your provider if needed.  Visit Complete: Virtual I connected with  Kaitlyn Ali on 11/22/23 by a audio enabled telemedicine application and verified that I am speaking with the correct person using two identifiers.  Patient Location: Home  Provider Location: Home Office  I discussed the limitations of evaluation and management by telemedicine. The patient expressed understanding and agreed to proceed.  Vital Signs: Because this visit was a virtual/telehealth visit, some criteria may be missing or patient reported. Any vitals not documented were not able to be obtained and vitals that have been documented are patient reported.  VideoDeclined- This patient declined Librarian, academic. Therefore the visit was completed with audio only.  Persons Participating in Visit: Patient.  AWV Questionnaire: Yes: Patient Medicare AWV questionnaire was completed by the patient on 11/21/23; I have confirmed that all information answered by patient is correct and no changes since this date.  Cardiac Risk Factors include: advanced age (>49men, >45 women)     Objective:    Today's Vitals   11/22/23 1407  Weight: 189 lb (85.7 kg)  Height: 5' 8 (1.727 m)   Body mass index is 28.74 kg/m.     11/22/2023    2:35 PM 11/16/2022    2:02 PM 03/07/2022   10:36 AM 11/18/2021   10:24 AM 11/11/2021   10:02 AM 11/04/2021    1:04 PM 10/20/2021   11:29 AM  Advanced Directives  Does Patient Have a Medical Advance Directive? No No No No No No No  Would patient like information on creating a medical advance directive? Yes (MAU/Ambulatory/Procedural Areas - Information given) No  - Patient declined No - Patient declined  No - Patient declined No - Patient declined No - Patient declined    Current Medications (verified) Outpatient Encounter Medications as of 11/22/2023  Medication Sig   diazepam (VALIUM) 5 MG tablet Take 5 mg by mouth 2 (two) times daily as needed for anxiety. Dr Vincente   ezetimibe  (ZETIA ) 10 MG tablet Take 1 tablet (10 mg total) by mouth daily.   hydrochlorothiazide  (HYDRODIURIL ) 12.5 MG tablet TAKE 1 TABLET(12.5 MG) BY MOUTH DAILY   oxyCODONE -acetaminophen  (PERCOCET) 10-325 MG tablet Take 1 tablet by mouth 4 (four) times daily.   No facility-administered encounter medications on file as of 11/22/2023.    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   Depression    GERD (gastroesophageal reflux disease)    h/o with pregnancy   Hypertension    Panic attack    Past Surgical History:  Procedure Laterality Date   BREAST BIOPSY Right 09/27/2021   UOQ mid lateral calcs, coil marker, DCIS   BREAST BIOPSY Right 09/27/2021   UOQ calcs anterior, X marker, DCIS   BREAST BIOPSY Left 09/27/2021   Distortion, ribbon marker, FIBROSIS AND USUAL DUCTAL HYPERPLASIA   BREAST LUMPECTOMY WITH NEEDLE LOCALIZATION Right 10/20/2021   Procedure: BREAST LUMPECTOMY WITH NEEDLE LOCALIZATION;  Surgeon: Dessa Reyes ORN, MD;  Location: [facility] ORS;  Service:  General;  Laterality: Right;   LAPAROSCOPIC HYSTERECTOMY     supracervical hyst due to AUB; Dr. Bernard    TOE SURGERY     shaved bone in R) great toe   TONSILLECTOMY     wire placement Right 10/20/2021   bracking for lumpectomy   Family History  Problem Relation Age of Onset   Diabetes Mother    Liver cancer Father 53   Breast cancer Sister 6       NEG BRCA testing   Brain cancer Paternal Uncle    Leukemia Maternal Grandfather     Breast cancer Cousin        dx 30s-40s   Social History   Socioeconomic History   Marital status: Divorced    Spouse name: Not on file   Number of children: 2   Years of education: Not on file   Highest education level: Some college, no degree  Occupational History   Occupation: retired  Tobacco Use   Smoking status: Former    Current packs/day: 0.00    Average packs/day: 0.3 packs/day for 15.0 years (3.8 ttl pk-yrs)    Types: Cigarettes    Start date: 64    Quit date: 2013    Years since quitting: 12.6   Smokeless tobacco: Never   Tobacco comments:    Social smoker only  Vaping Use   Vaping status: Never Used  Substance and Sexual Activity   Alcohol  use: Not Currently   Drug use: No   Sexual activity: Yes  Other Topics Concern   Not on file  Social History Narrative   Not on file   Social Drivers of Health   Financial Resource Strain: Low Risk  (11/22/2023)   Overall Financial Resource Strain (CARDIA)    Difficulty of Paying Living Expenses: Not very hard  Food Insecurity: Food Insecurity Present (11/22/2023)   Hunger Vital Sign    Worried About Running Out of Food in the Last Year: Sometimes true    Ran Out of Food in the Last Year: Sometimes true  Transportation Needs: No Transportation Needs (11/22/2023)   PRAPARE - Administrator, Civil Service (Medical): No    Lack of Transportation (Non-Medical): No  Physical Activity: Sufficiently Active (11/22/2023)   Exercise Vital Sign    Days of Exercise per Week: 4 days    Minutes of Exercise per Session: 60 min  Stress: Stress Concern Present (11/22/2023)   [employer] of Occupational Health - Occupational Stress Questionnaire    Feeling of Stress: To some extent  Social Connections: Moderately Isolated (11/22/2023)   Social Connection and Isolation Panel    Frequency of Communication with Friends and Family: More than three times a week    Frequency of Social Gatherings with Friends and Family:  More than three times a week    Attends Religious Services: 1 to 4 times per year    Active Member of [employer] or Organizations: No    Attends Engineer, structural: Never    Marital Status: Divorced    Tobacco Counseling Counseling given: Not Answered Tobacco comments: Social smoker only    Clinical Intake:  Pre-visit preparation completed: Yes  Pain : No/denies pain     BMI - recorded: 28.74 Nutritional Status: BMI 25 -29 Overweight Nutritional Risks: None Diabetes: No  Lab Results  Component Value Date   HGBA1C 6.1 06/13/2023     How often do you need to have someone help you when you read instructions, pamphlets, or  other written materials from your doctor or pharmacy?: 1 - Never  Interpreter Needed?: No  Information entered by :: Vina Ned, CMA   Activities of Daily Living     11/22/2023    2:08 PM 11/21/2023    6:07 AM  In your present state of health, do you have any difficulty performing the following activities:  Hearing? 0 0  Vision? 0 0  Difficulty concentrating or making decisions? 0 0  Walking or climbing stairs? 0 0  Dressing or bathing? 0 0  Doing errands, shopping? 0 0  Preparing Food and eating ? N N  Using the Toilet? N N  In the past six months, have you accidently leaked urine? N N  Do you have problems with loss of bowel control? N N  Managing your Medications? N N  Managing your Finances? N N  Housekeeping or managing your Housekeeping? N N    Patient Care Team: Lemon Raisin, MD as PCP - General (Internal Medicine) Georgina Shasta POUR, RN as Oncology Nurse Navigator Broadus Bare, [place] (Optometry) Lenn Aran, MD as Consulting Physician (Radiation Oncology) Clide Boss, MD as Referring Physician (Anesthesiology)  I have updated your Care Teams any recent Medical Services you may have received from other providers in the past year.     Assessment:   This is a routine wellness examination for  Kaitlyn Ali.  Hearing/Vision screen Hearing Screening - Comments:: Denies hearing loss  Vision Screening - Comments:: Gets routine eye exams, Dr. Broadus, [facility] [place]   Goals Addressed               This Visit's Progress     Increase physical activity (pt-stated)        And lose weight       Depression Screen     11/22/2023    2:13 PM 07/13/2023   10:00 AM 01/31/2023    9:05 AM 11/16/2022    2:00 PM 08/08/2022    9:26 AM 01/14/2022   11:09 AM 09/09/2021   10:33 AM  PHQ 2/9 Scores  PHQ - 2 Score 0 0 0 0 0 0 1  PHQ- 9 Score 1 9 0  0 0 4    Fall Risk     11/22/2023    2:21 PM 11/21/2023    6:07 AM 07/13/2023   10:00 AM 01/31/2023    9:05 AM 11/16/2022    2:01 PM  Fall Risk   Falls in the past year? 0 0 0 0 0  Number falls in past yr: 0  0 0 0  Injury with Fall? 0  0 0 0  Risk for fall due to : No Fall Risks  No Fall Risks No Fall Risks No Fall Risks  Follow up Falls evaluation completed  Falls evaluation completed Falls evaluation completed Falls prevention discussed    MEDICARE RISK AT HOME:  Medicare Risk at Home Any stairs in or around the home?: No If so, are there any without handrails?: No Home free of loose throw rugs in walkways, pet beds, electrical cords, etc?: Yes Adequate lighting in your home to reduce risk of falls?: Yes Life alert?: No Use of a cane, walker or w/c?: No Grab bars in the bathroom?: Yes Shower chair or bench in shower?: No Elevated toilet seat or a handicapped toilet?: No  TIMED UP AND GO:  Was the test performed?  No  Cognitive Function: 6CIT completed        11/22/2023    2:21 PM 11/16/2022  2:02 PM  6CIT Screen  What Year? 0 points 0 points  What month? 0 points 0 points  What time? 0 points 0 points  Count back from 20 0 points 0 points  Months in reverse 0 points 0 points  Repeat phrase 0 points 0 points  Total Score 0 points 0 points    Immunizations  There is no immunization history on file for this  patient.  Screening Tests Health Maintenance  Topic Date Due   COVID-19 Vaccine (1) Never done   Hepatitis C Screening  Never done   DTaP/Tdap/Td (1 - Tdap) Never done   Pneumococcal Vaccine: 50+ Years (1 of 2 - PCV) Never done   Zoster Vaccines- Shingrix (1 of 2) Never done   Colonoscopy  04/05/2023   MAMMOGRAM  07/24/2023   INFLUENZA VACCINE  11/03/2023   Medicare Annual Wellness (AWV)  11/21/2024   DEXA SCAN  08/10/2026   HPV VACCINES  Aged Out   Meningococcal B Vaccine  Aged Out    Health Maintenance  Health Maintenance Due  Topic Date Due   COVID-19 Vaccine (1) Never done   Hepatitis C Screening  Never done   DTaP/Tdap/Td (1 - Tdap) Never done   Pneumococcal Vaccine: 50+ Years (1 of 2 - PCV) Never done   Zoster Vaccines- Shingrix (1 of 2) Never done   Colonoscopy  04/05/2023   MAMMOGRAM  07/24/2023   INFLUENZA VACCINE  11/03/2023   Health Maintenance Items Addressed: See Nurse Notes at the end of this note  Additional Screening:  Vision Screening: Recommended annual ophthalmology exams for early detection of glaucoma and other disorders of the eye. Would you like a referral to an eye doctor? No    Dental Screening: Recommended annual dental exams for proper oral hygiene  Community Resource Referral / Chronic Care Management: CRR required this visit?  No   CCM required this visit?  No   Plan:    I have personally reviewed and noted the following in the patient's chart:   Medical and social history Use of alcohol , tobacco or illicit drugs  Current medications and supplements including opioid prescriptions. Patient is currently taking opioid prescriptions. Information provided to patient regarding non-opioid alternatives. Patient advised to discuss non-opioid treatment plan with their provider. Functional ability and status Nutritional status Physical activity Advanced directives List of other physicians Hospitalizations, surgeries, and ER visits in  previous 12 months Vitals Screenings to include cognitive, depression, and falls Referrals and appointments  In addition, I have reviewed and discussed with patient certain preventive protocols, quality metrics, and best practice recommendations. A written personalized care plan for preventive services as well as general preventive health recommendations were provided to patient.   Vina Ned, CMA   11/22/2023   After Visit Summary: (MyChart) Due to this being a telephonic visit, the after visit summary with patients personalized plan was offered to patient via MyChart   Notes:  Declined all immunizations Gave ph# to call and schedule diagnostic MMG that was due April 2025 (hx breast cancer) Declined colonoscopy Included food panty information in AVS

## 2023-12-27 ENCOUNTER — Encounter: Payer: Self-pay | Admitting: Student

## 2023-12-27 ENCOUNTER — Ambulatory Visit: Admitting: Student

## 2023-12-27 VITALS — BP 128/80 | HR 74 | Ht 68.0 in | Wt 191.0 lb

## 2023-12-27 DIAGNOSIS — I10 Essential (primary) hypertension: Secondary | ICD-10-CM | POA: Diagnosis not present

## 2023-12-27 DIAGNOSIS — E785 Hyperlipidemia, unspecified: Secondary | ICD-10-CM | POA: Diagnosis not present

## 2023-12-27 DIAGNOSIS — G720 Drug-induced myopathy: Secondary | ICD-10-CM

## 2023-12-27 DIAGNOSIS — F410 Panic disorder [episodic paroxysmal anxiety] without agoraphobia: Secondary | ICD-10-CM

## 2023-12-27 DIAGNOSIS — G894 Chronic pain syndrome: Secondary | ICD-10-CM | POA: Diagnosis not present

## 2023-12-27 DIAGNOSIS — C50911 Malignant neoplasm of unspecified site of right female breast: Secondary | ICD-10-CM

## 2023-12-27 DIAGNOSIS — E782 Mixed hyperlipidemia: Secondary | ICD-10-CM

## 2023-12-27 DIAGNOSIS — Z79890 Hormone replacement therapy (postmenopausal): Secondary | ICD-10-CM

## 2023-12-27 DIAGNOSIS — Z79891 Long term (current) use of opiate analgesic: Secondary | ICD-10-CM

## 2023-12-27 MED ORDER — HYDROCHLOROTHIAZIDE 12.5 MG PO TABS
ORAL_TABLET | ORAL | 1 refills | Status: AC
Start: 2023-12-27 — End: ?

## 2023-12-27 MED ORDER — EZETIMIBE 10 MG PO TABS: 10.0000 mg | ORAL_TABLET | Freq: Every day | ORAL | 1 refills | Status: AC

## 2023-12-27 NOTE — Assessment & Plan Note (Addendum)
 Has chronic bursitis  R shoulder and OA L knee pain, on oxycodone  10 mg 4 times a day. Also started on testocerone pellets which she had a few months ago. Unsure if she wants to continue this. Counseled her stop testosterone  given hx of triple positive breast cancer. CBC and CMP today

## 2023-12-27 NOTE — Assessment & Plan Note (Signed)
 BP 130/74  today. Currently on hydrochlorothiazide  12.5 mg daily. CMP today. Continue current medication.

## 2023-12-27 NOTE — Assessment & Plan Note (Signed)
 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

## 2023-12-27 NOTE — Patient Instructions (Signed)
 Please call to schedule your mammogram [phone]

## 2023-12-27 NOTE — Progress Notes (Unsigned)
 Established Patient Office Visit  Subjective   Patient ID: Coralynn Riggs Ali, female    DOB: 1956/01/23  Age: 68 y.o. MRN: [id number]  Chief Complaint  Patient presents with   Establish Care    Patient is here to establish care with new PCP    Kaitlyn Ali with medical hx listed below presents today for transfer of care. Previously seeing PCP  Dr. Joshua. No acute complaints. Please refer to problem based charting for further details and assessment and plan of current problem and chronic medical conditions.  Patient Active Problem List   Diagnosis Date Noted   HLD (hyperlipidemia) 12/29/2023   Essential hypertension 12/27/2023   Panic attacks 12/27/2023   Statin myopathy 04/22/2022   Invasive ductal carcinoma of right breast (HCC) 11/11/2021   Family history of breast cancer 06/14/2021   Chronic pain of both knees (Secondary Area of Pain) (L>R) 10/11/2017   Chronic right shoulder pain (Primary Area of Pain) 10/11/2017   Chronic pain syndrome 10/11/2017   Long term current use of opiate analgesic 10/11/2017      ROS Refer to HPI    Objective:     Outpatient Encounter Medications as of 12/27/2023  Medication Sig   diazepam (VALIUM) 5 MG tablet Take 5 mg by mouth 2 (two) times daily as needed for anxiety. Dr Vincente   oxyCODONE -acetaminophen  (PERCOCET) 10-325 MG tablet Take 1 tablet by mouth 4 (four) times daily.   rosuvastatin  (CRESTOR ) 5 MG tablet Take 2 tablets (10 mg total) by mouth daily.   [DISCONTINUED] ezetimibe  (ZETIA ) 10 MG tablet Take 1 tablet (10 mg total) by mouth daily.   [DISCONTINUED] hydrochlorothiazide  (HYDRODIURIL ) 12.5 MG tablet TAKE 1 TABLET(12.5 MG) BY MOUTH DAILY   ezetimibe  (ZETIA ) 10 MG tablet Take 1 tablet (10 mg total) by mouth daily.   hydrochlorothiazide  (HYDRODIURIL ) 12.5 MG tablet TAKE 1 TABLET(12.5 MG) BY MOUTH DAILY   No facility-administered encounter medications on file as of 12/27/2023.    BP 128/80   Pulse 74   Ht 5' 8 (1.727 m)    Wt 191 lb (86.6 kg)   SpO2 96%   BMI 29.04 kg/m  BP Readings from Last 3 Encounters:  12/27/23 128/80  07/13/23 124/82  01/31/23 128/70    Physical Exam Constitutional:      Appearance: Normal appearance.  HENT:     Mouth/Throat:     Mouth: Mucous membranes are moist.     Pharynx: Oropharynx is clear.  Cardiovascular:     Rate and Rhythm: Normal rate and regular rhythm.  Pulmonary:     Effort: Pulmonary effort is normal.     Breath sounds: No rhonchi or rales.  Abdominal:     General: Abdomen is flat. Bowel sounds are normal. There is no distension.     Palpations: Abdomen is soft.     Tenderness: There is no abdominal tenderness.  Musculoskeletal:        General: Normal range of motion.     Right lower leg: No edema.     Left lower leg: No edema.  Skin:    General: Skin is warm and dry.     Capillary Refill: Capillary refill takes less than 2 seconds.  Neurological:     General: No focal deficit present.     Mental Status: She is alert and oriented to person, place, and time.  Psychiatric:        Mood and Affect: Mood normal.        Behavior: Behavior  normal.        12/27/2023   10:34 AM 11/22/2023    2:13 PM 07/13/2023   10:00 AM  Depression screen PHQ 2/9  Decreased Interest 0 0 0  Down, Depressed, Hopeless 0 0 0  PHQ - 2 Score 0 0 0  Altered sleeping 0 0 3  Tired, decreased energy 0 1 3  Change in appetite 0 0 3  Feeling bad or failure about yourself  0 0 0  Trouble concentrating 0 0 0  Moving slowly or fidgety/restless 0 0 0  Suicidal thoughts 0 0 0  PHQ-9 Score 0 1 9  Difficult doing work/chores Not difficult at all Not difficult at all Not difficult at all       12/27/2023   10:35 AM 07/13/2023   10:01 AM 01/31/2023    9:05 AM 08/08/2022    9:26 AM  GAD 7 : Generalized Anxiety Score  Nervous, Anxious, on Edge 0 1 0 0  Control/stop worrying 0 1 0 0  Worry too much - different things 0 1 0 0  Trouble relaxing 0 0 0 0  Restless 0 0 0 0  Easily  annoyed or irritable 0 0 0 0  Afraid - awful might happen 0 0 0 0  Total GAD 7 Score 0 3 0 0  Anxiety Difficulty Not difficult at all Not difficult at all Not difficult at all Not difficult at all    Results for orders placed or performed in visit on 12/27/23  Comprehensive Metabolic Panel (CMET)  Result Value Ref Range   Glucose 99 70 - 99 mg/dL   BUN 15 8 - 27 mg/dL   Creatinine, Ser 8.84 (H) 0.57 - 1.00 mg/dL   eGFR 52 (L) >40 fO/fpw/8.26   BUN/Creatinine Ratio 13 12 - 28   Sodium 138 134 - 144 mmol/L   Potassium 4.4 3.5 - 5.2 mmol/L   Chloride 99 96 - 106 mmol/L   CO2 24 20 - 29 mmol/L   Calcium  10.2 8.7 - 10.3 mg/dL   Total Protein 7.0 6.0 - 8.5 g/dL   Albumin 4.5 3.9 - 4.9 g/dL   Globulin, Total 2.5 1.5 - 4.5 g/dL   Bilirubin Total 0.5 0.0 - 1.2 mg/dL   Alkaline Phosphatase 69 49 - 135 IU/L   AST 20 0 - 40 IU/L   ALT 21 0 - 32 IU/L  CBC with Differential/Platelet  Result Value Ref Range   WBC 6.8 3.4 - 10.8 x10E3/uL   RBC 5.19 3.77 - 5.28 x10E6/uL   Hemoglobin 16.0 (H) 11.1 - 15.9 g/dL   Hematocrit 50.9 (H) 65.9 - 46.6 %   MCV 94 79 - 97 fL   MCH 30.8 26.6 - 33.0 pg   MCHC 32.7 31.5 - 35.7 g/dL   RDW 86.8 88.2 - 84.5 %   Platelets 325 150 - 450 x10E3/uL   Neutrophils 54 Not Estab. %   Lymphs 30 Not Estab. %   Monocytes 9 Not Estab. %   Eos 6 Not Estab. %   Basos 1 Not Estab. %   Neutrophils Absolute 3.7 1.4 - 7.0 x10E3/uL   Lymphocytes Absolute 2.0 0.7 - 3.1 x10E3/uL   Monocytes Absolute 0.6 0.1 - 0.9 x10E3/uL   EOS (ABSOLUTE) 0.4 0.0 - 0.4 x10E3/uL   Basophils Absolute 0.1 0.0 - 0.2 x10E3/uL   Immature Granulocytes 0 Not Estab. %   Immature Grans (Abs) 0.0 0.0 - 0.1 x10E3/uL  Lipid panel  Result Value Ref Range  Cholesterol, Total 196 100 - 199 mg/dL   Triglycerides 772 (H) 0 - 149 mg/dL   HDL 38 (L) >60 mg/dL   VLDL Cholesterol Cal 40 5 - 40 mg/dL   LDL Chol Calc (NIH) 881 (H) 0 - 99 mg/dL   Chol/HDL Ratio 5.2 (H) 0.0 - 4.4 ratio    Last CBC Lab  Results  Component Value Date   WBC 6.8 12/27/2023   HGB 16.0 (H) 12/27/2023   HCT 49.0 (H) 12/27/2023   MCV 94 12/27/2023   MCH 30.8 12/27/2023   RDW 13.1 12/27/2023   PLT 325 12/27/2023   Last metabolic panel Lab Results  Component Value Date   GLUCOSE 99 12/27/2023   NA 138 12/27/2023   K 4.4 12/27/2023   CL 99 12/27/2023   CO2 24 12/27/2023   BUN 15 12/27/2023   CREATININE 1.15 (H) 12/27/2023   EGFR 52 (L) 12/27/2023   CALCIUM  10.2 12/27/2023   PHOS 3.4 01/31/2023   PROT 7.0 12/27/2023   ALBUMIN 4.5 12/27/2023   LABGLOB 2.5 12/27/2023   AGRATIO 1.9 08/08/2022   BILITOT 0.5 12/27/2023   ALKPHOS 69 12/27/2023   AST 20 12/27/2023   ALT 21 12/27/2023   Last lipids Lab Results  Component Value Date   CHOL 196 12/27/2023   HDL 38 (L) 12/27/2023   LDLCALC 118 (H) 12/27/2023   TRIG 227 (H) 12/27/2023   CHOLHDL 5.2 (H) 12/27/2023   Last hemoglobin A1c Lab Results  Component Value Date   HGBA1C 6.1 06/13/2023   Last thyroid functions Lab Results  Component Value Date   TSH 1.45 06/13/2023      The 10-year ASCVD risk score (Arnett DK, et al., 2019) is: 10.3%    Assessment & Plan:  Invasive ductal carcinoma of right breast [facility]) Assessment & Plan: 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 She did have whole breast and peripheral lymphatic radiation therapy to her right breast. Has not followed up with oncology since 03/2022. Mammogram on 01/23/2023 with benign scattered punctate calcifications along the lumpectomy bed without discrete grouping. Possible post surgical change however recommended diagnostic mammogram given positive surgical margins which she has not scheduled.  - gave her contact information to schedule mammogram  - Stop further testosterone  treatment, unclear how this may  interact if she has residual disease   Essential hypertension Assessment & Plan: BP 130/74  today. Currently on hydrochlorothiazide  12.5 mg daily. CMP today. Continue current medication.   Orders: -     Comprehensive metabolic panel with GFR  Chronic pain syndrome Assessment & Plan: Has chronic bursitis  R shoulder and OA L knee pain, on oxycodone  10 mg 4 times a day. Also started on testocerone pellets which she had a few months ago. Unsure if she wants to continue this. Counseled her stop testosterone  given hx of triple positive breast cancer. CBC and CMP today   Dyslipidemia -     Lipid panel -     Ezetimibe ; Take 1 tablet (10 mg total) by mouth daily.  Dispense: 90 tablet; Refill: 1  Primary hypertension -     hydroCHLOROthiazide ; TAKE 1 TABLET(12.5 MG) BY MOUTH DAILY  Dispense: 90 tablet; Refill: 1  Long term current use of opiate analgesic -     CBC with Differential/Platelet  Long-term current use of testosterone  replacement therapy  Panic attacks  Assessment & Plan: On valium as needed follow psych.    Statin myopathy Assessment & Plan: Has been on lovastatin and atorvastatin . Reports she had GI effects with this. Does not recall having muscle aches. On chart review rash after starting atorvastatin , bx with dermatology with telangiectasias that appears unrelated. Agreeable to trial of alternative statin if cholesterol and ascvd risk remains elevated.    Mixed hyperlipidemia Assessment & Plan: On zetia , LDL and triglycerides remain high. Lipid panel today. May benefit from statin therapy. See statin myopathy for more details.    Other orders -     Rosuvastatin  Calcium ; Take 2 tablets (10 mg total) by mouth daily.  Dispense: 30 tablet; Refill: 5     Return in about 6 months (around 06/25/2024) for HTN.    Harlene Saddler, MD

## 2023-12-28 ENCOUNTER — Ambulatory Visit: Payer: Self-pay | Admitting: Student

## 2023-12-28 LAB — CBC WITH DIFFERENTIAL/PLATELET
Basophils Absolute: 0.1 x10E3/uL (ref 0.0–0.2)
Basos: 1 %
EOS (ABSOLUTE): 0.4 x10E3/uL (ref 0.0–0.4)
Eos: 6 %
Hematocrit: 49 % — ABNORMAL HIGH (ref 34.0–46.6)
Hemoglobin: 16 g/dL — ABNORMAL HIGH (ref 11.1–15.9)
Immature Grans (Abs): 0 x10E3/uL (ref 0.0–0.1)
Immature Granulocytes: 0 %
Lymphocytes Absolute: 2 x10E3/uL (ref 0.7–3.1)
Lymphs: 30 %
MCH: 30.8 pg (ref 26.6–33.0)
MCHC: 32.7 g/dL (ref 31.5–35.7)
MCV: 94 fL (ref 79–97)
Monocytes Absolute: 0.6 x10E3/uL (ref 0.1–0.9)
Monocytes: 9 %
Neutrophils Absolute: 3.7 x10E3/uL (ref 1.4–7.0)
Neutrophils: 54 %
Platelets: 325 x10E3/uL (ref 150–450)
RBC: 5.19 x10E6/uL (ref 3.77–5.28)
RDW: 13.1 % (ref 11.7–15.4)
WBC: 6.8 x10E3/uL (ref 3.4–10.8)

## 2023-12-28 LAB — COMPREHENSIVE METABOLIC PANEL WITH GFR
ALT: 21 IU/L (ref 0–32)
AST: 20 IU/L (ref 0–40)
Albumin: 4.5 g/dL (ref 3.9–4.9)
Alkaline Phosphatase: 69 IU/L (ref 49–135)
BUN/Creatinine Ratio: 13 (ref 12–28)
BUN: 15 mg/dL (ref 8–27)
Bilirubin Total: 0.5 mg/dL (ref 0.0–1.2)
CO2: 24 mmol/L (ref 20–29)
Calcium: 10.2 mg/dL (ref 8.7–10.3)
Chloride: 99 mmol/L (ref 96–106)
Creatinine, Ser: 1.15 mg/dL — ABNORMAL HIGH (ref 0.57–1.00)
Globulin, Total: 2.5 g/dL (ref 1.5–4.5)
Glucose: 99 mg/dL (ref 70–99)
Potassium: 4.4 mmol/L (ref 3.5–5.2)
Sodium: 138 mmol/L (ref 134–144)
Total Protein: 7 g/dL (ref 6.0–8.5)
eGFR: 52 mL/min/1.73 — ABNORMAL LOW (ref >59)

## 2023-12-28 LAB — LIPID PANEL
Chol/HDL Ratio: 5.2 ratio — ABNORMAL HIGH (ref 0.0–4.4)
Cholesterol, Total: 196 mg/dL (ref 100–199)
HDL: 38 mg/dL — ABNORMAL LOW (ref >39)
LDL Chol Calc (NIH): 118 mg/dL — ABNORMAL HIGH (ref 0–99)
Triglycerides: 227 mg/dL — ABNORMAL HIGH (ref 0–149)
VLDL Cholesterol Cal: 40 mg/dL (ref 5–40)

## 2023-12-28 MED ORDER — ROSUVASTATIN CALCIUM 5 MG PO TABS: 10.0000 mg | ORAL_TABLET | Freq: Every day | ORAL | 5 refills | Status: DC

## 2023-12-29 ENCOUNTER — Telehealth: Payer: Self-pay

## 2023-12-29 ENCOUNTER — Other Ambulatory Visit (HOSPITAL_COMMUNITY): Payer: Self-pay

## 2023-12-29 ENCOUNTER — Encounter: Payer: Self-pay | Admitting: Radiation Oncology

## 2023-12-29 DIAGNOSIS — E785 Hyperlipidemia, unspecified: Secondary | ICD-10-CM | POA: Insufficient documentation

## 2023-12-29 NOTE — Assessment & Plan Note (Signed)
 On zetia , LDL and triglycerides remain high. Lipid panel today. May benefit from statin therapy. See statin myopathy for more details.

## 2023-12-29 NOTE — Assessment & Plan Note (Signed)
 On valium as needed follow psych.

## 2023-12-29 NOTE — Assessment & Plan Note (Signed)
 Has been on lovastatin and atorvastatin . Reports she had GI effects with this. Does not recall having muscle aches. On chart review rash after starting atorvastatin , bx with dermatology with telangiectasias that appears unrelated. Agreeable to trial of alternative statin if cholesterol and ascvd risk remains elevated.

## 2024-01-01 ENCOUNTER — Other Ambulatory Visit: Payer: Self-pay | Admitting: Student

## 2024-01-01 ENCOUNTER — Other Ambulatory Visit (HOSPITAL_COMMUNITY): Payer: Self-pay

## 2024-01-01 ENCOUNTER — Telehealth: Payer: Self-pay | Admitting: Pharmacy Technician

## 2024-01-01 MED ORDER — ROSUVASTATIN CALCIUM 5 MG PO TABS: 5.0000 mg | ORAL_TABLET | Freq: Every day | ORAL | 5 refills | Status: AC

## 2024-01-01 NOTE — Telephone Encounter (Signed)
 The 5 mg went through her insurance just fine, no further action is needed.

## 2024-01-01 NOTE — Telephone Encounter (Signed)
 Please review, Dr. Lemon has changed rx

## 2024-01-01 NOTE — Telephone Encounter (Signed)
 Please review, if okay, I can send in the 10 mg tablets

## 2024-01-01 NOTE — Telephone Encounter (Signed)
 Pharmacy Patient Advocate Encounter   Received notification from Pt Calls Messages that prior authorization for ROSUVASTATIN  5mg  tablets is required/requested.   Insurance verification completed.   The patient is insured through [facility] [employer] .   Per test claim:  ROSUVASTATIN  10 mg is preferred by the insurance.  If suggested medication is appropriate, Please send in a new RX and discontinue this one. If not, please advise as to why it's not appropriate so that we may request a Prior Authorization. Please note, some preferred medications may still require a PA.  If the suggested medications have not been trialed and there are no contraindications to their use, the PA will not be submitted, as it will not be approved.

## 2024-01-01 NOTE — Telephone Encounter (Signed)
 Good morning, her script is written for rosuvastatin  5 mg take 2 tablets (10 mg) daily, her plan just rejected because 5 mg tablet is 1 tablet daily so if prescriber want 10 mg daily she just has to change the script to rosuvastatin  10 mg take 1 tablet daily

## 2024-01-02 ENCOUNTER — Other Ambulatory Visit (HOSPITAL_COMMUNITY): Payer: Self-pay

## 2024-01-02 ENCOUNTER — Telehealth: Payer: Self-pay

## 2024-01-02 NOTE — Telephone Encounter (Signed)
 Great, thank you!

## 2024-01-02 NOTE — Telephone Encounter (Unsigned)
 Copied from CRM [id number]. Topic: Clinical - Medication Prior Auth >> Jan 02, 2024  9:40 AM Jasmin G wrote: Reason for CRM: Staff from [facility] requested a call back from one of Dr. Maximiano nurses regarding prior authorization for rosuvastatin  (CRESTOR ) 5 MG tablet. Call back at  [phone] case #[id number], if response if not received by Oct 2nd case may be denied.

## 2024-01-02 NOTE — Telephone Encounter (Signed)
 Good afternoon Kaitlyn Ali, yes no worries I just got off the phone with [facility] and it was about the rosuvastatin  5mg  take 2 tablets daily and I told him to cancel that PA it was no longer needed since we got clarification that it was for 5mg  daily.  Have a great day!

## 2024-01-25 ENCOUNTER — Other Ambulatory Visit

## 2024-01-25 ENCOUNTER — Ambulatory Visit
Admission: RE | Admit: 2024-01-25 | Discharge: 2024-01-25 | Disposition: A | Discharge status: Home / Self Care | Source: Ambulatory Visit | Attending: Radiation Oncology | Admitting: Radiation Oncology

## 2024-01-25 DIAGNOSIS — R921 Mammographic calcification found on diagnostic imaging of breast: Secondary | ICD-10-CM | POA: Insufficient documentation

## 2024-01-25 DIAGNOSIS — R928 Other abnormal and inconclusive findings on diagnostic imaging of breast: Secondary | ICD-10-CM | POA: Insufficient documentation

## 2024-02-10 IMAGING — US US BREAST*R* LIMITED INC AXILLA
1 series · 2 of 2 positions shown · non-contrast
Comparison: Previous exam(s).

CLINICAL DATA: Patient was recalled from screening mammogram for
right breast calcifications and possible asymmetry in the left
breast.



[Series 1: us breast*right* limited inc axilla · 0.07mm/px · 2 of 2 slices shown]
[im 1/2]
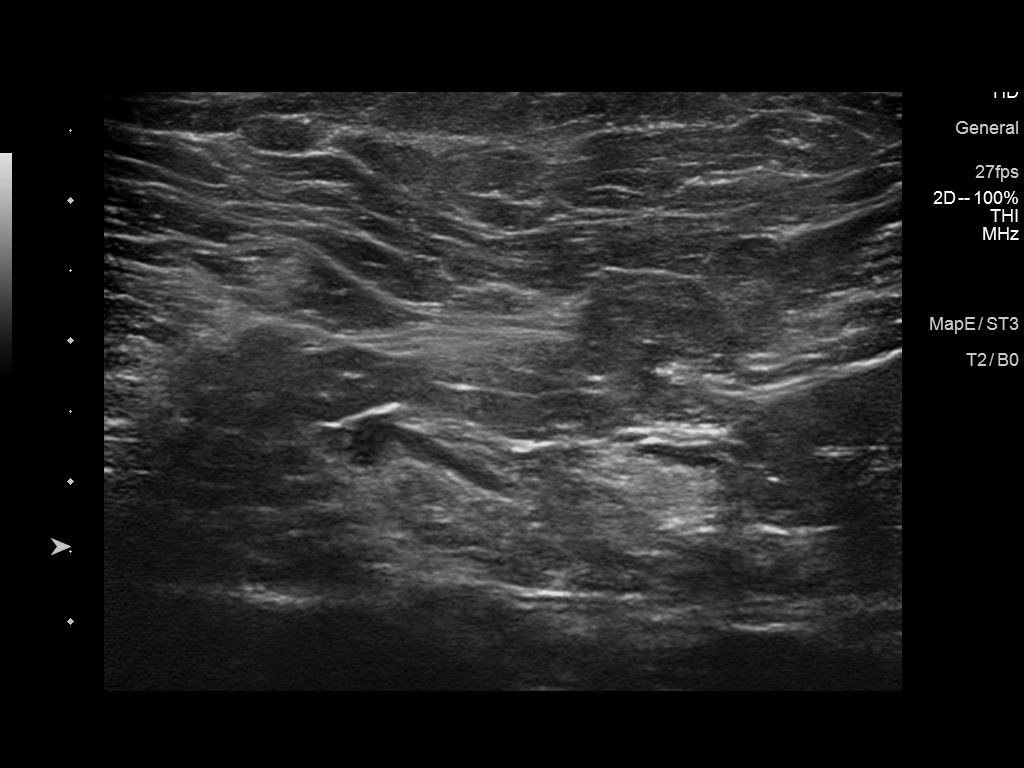
[im 2/2]
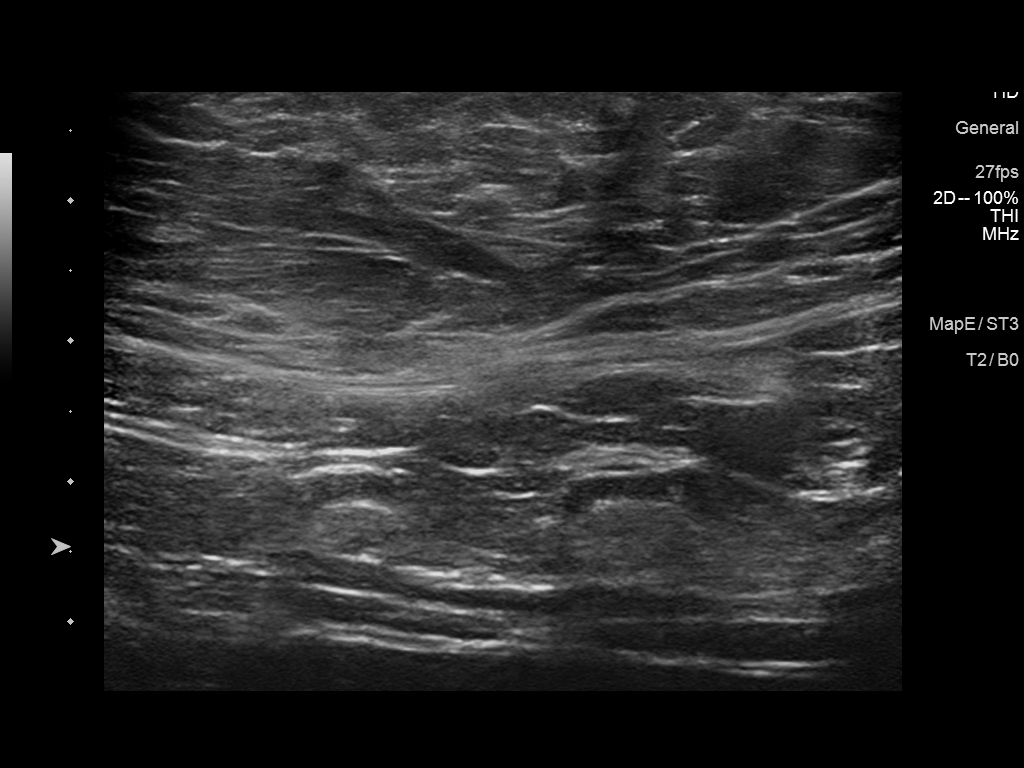

[2 of 2 positions shown; findings below may reference images not displayed]

ACR Breast Density Category b: There are scattered areas of
fibroglandular density.
FINDINGS: Additional imaging of the right breast was performed. There are
linear branching suspicious calcifications in the upper-outer
quadrant of the breast spanning an area of 5.7 cm. They are
suspicious for ductal carcinoma in-situ.

Additional imaging of the left breast was performed showing
persistent distortion in the 12 o'clock region of the breast. There
are no malignant type microcalcifications.

On physical exam, I do not palpate a mass in the upper-outer
quadrant of the right breast or the 12 o'clock region of the left
breast.

Targeted ultrasound is performed, showing no suspicious mass in the
upper-outer quadrant of the right breast or the 12 o'clock region of
the left breast. No enlarged adenopathy is seen in the right or left
axilla.
IMPRESSION: Suspicious calcifications in the upper-outer quadrant of the right
breast spanning 5.7 cm. Suspicious distortion in the 12 o'clock
region of the left breast.

RECOMMENDATION:
Stereotactic biopsies of the anterior and posterior extent of the
calcifications in the upper-outer quadrant of the right breast is
recommended.

Stereotactic biopsy of the distortion in the 12 o'clock region of
the left breast is recommended.

I have discussed the findings and recommendations with the patient.
If applicable, a reminder letter will be sent to the patient
regarding the next appointment.

BI-RADS CATEGORY  5: Highly suggestive of malignancy.

## 2024-02-12 ENCOUNTER — Encounter: Payer: Self-pay | Admitting: Student

## 2024-02-12 ENCOUNTER — Ambulatory Visit (INDEPENDENT_AMBULATORY_CARE_PROVIDER_SITE_OTHER): Admitting: Student

## 2024-02-12 ENCOUNTER — Ambulatory Visit: Admitting: Internal Medicine

## 2024-02-12 VITALS — BP 124/72 | HR 99 | Temp 98.0°F | Ht 68.0 in | Wt 192.0 lb

## 2024-02-12 DIAGNOSIS — R300 Dysuria: Secondary | ICD-10-CM

## 2024-02-12 LAB — POCT URINALYSIS DIPSTICK
Bilirubin, UA: NEGATIVE
Glucose, UA: NEGATIVE
Ketones, UA: NEGATIVE
Nitrite, UA: NEGATIVE
Protein, UA: NEGATIVE
Spec Grav, UA: 1.015 (ref 1.010–1.025)
Urobilinogen, UA: 0.2 U/dL
pH, UA: 6.5 (ref 5.0–8.0)

## 2024-02-12 NOTE — Progress Notes (Addendum)
 Established Patient Office Visit  Subjective   Patient ID: Kaitlyn Ali, female    DOB: Oct 10, 1955  Age: 68 y.o. MRN: [id number]  Chief Complaint  Patient presents with   Urinary Tract Infection    Kaitlyn Ali with medical hx listed below presents today for dysuria that started 3 days ago. Reports associated frequency, tingling, and feeling hot. She did not check temperature at home but is concern she may be febrile. Reports was at the beach last week and in the hot tub and feels sx started a few days afterwards. Is taking AZO with improvement in sx. Denies chills, n/v/d, abdominal pain, flank pain, rash, vaginal bleeding, hematuria or incontinence.  Patient Active Problem List   Diagnosis Date Noted   HLD (hyperlipidemia) 12/29/2023   Essential hypertension 12/27/2023   Panic attacks 12/27/2023   Statin myopathy 04/22/2022   Invasive ductal carcinoma of right breast (HCC) 11/11/2021   Family history of breast cancer 06/14/2021   Chronic pain of both knees (Secondary Area of Pain) (L>R) 10/11/2017   Chronic right shoulder pain (Primary Area of Pain) 10/11/2017   Chronic pain syndrome 10/11/2017   Long term current use of opiate analgesic 10/11/2017      ROS Refer to HPI    Objective:     Outpatient Encounter Medications as of 02/12/2024  Medication Sig   diazepam (VALIUM) 5 MG tablet Take 5 mg by mouth 2 (two) times daily as needed for anxiety. Dr Vincente   ezetimibe  (ZETIA ) 10 MG tablet Take 1 tablet (10 mg total) by mouth daily.   hydrochlorothiazide  (HYDRODIURIL ) 12.5 MG tablet TAKE 1 TABLET(12.5 MG) BY MOUTH DAILY   oxyCODONE -acetaminophen  (PERCOCET) 10-325 MG tablet Take 1 tablet by mouth 4 (four) times daily.   rosuvastatin  (CRESTOR ) 5 MG tablet Take 1 tablet (5 mg total) by mouth daily.   No facility-administered encounter medications on file as of 02/12/2024.    BP 124/72   Pulse 99   Ht 5' 8 (1.727 m)   Wt 192 lb (87.1 kg)   SpO2 96%   BMI  29.19 kg/m  BP Readings from Last 3 Encounters:  02/12/24 124/72  12/27/23 128/80  07/13/23 124/82    Physical Exam Constitutional:      Appearance: Normal appearance.  Cardiovascular:     Rate and Rhythm: Normal rate and regular rhythm.  Pulmonary:     Effort: Pulmonary effort is normal.     Breath sounds: No rhonchi or rales.  Abdominal:     General: Abdomen is flat. Bowel sounds are normal. There is no distension.     Palpations: Abdomen is soft.     Tenderness: There is no abdominal tenderness. There is no right CVA tenderness or left CVA tenderness.  Skin:    General: Skin is warm and dry.     Capillary Refill: Capillary refill takes less than 2 seconds.  Neurological:     General: No focal deficit present.     Mental Status: She is alert and oriented to person, place, and time.  Psychiatric:        Mood and Affect: Mood normal.        Behavior: Behavior normal.        12/27/2023   10:34 AM 11/22/2023    2:13 PM 07/13/2023   10:00 AM  Depression screen PHQ 2/9  Decreased Interest 0 0 0  Down, Depressed, Hopeless 0 0 0  PHQ - 2 Score 0 0 0  Altered sleeping 0  0 3  Tired, decreased energy 0 1 3  Change in appetite 0 0 3  Feeling bad or failure about yourself  0 0 0  Trouble concentrating 0 0 0  Moving slowly or fidgety/restless 0 0 0  Suicidal thoughts 0 0 0  PHQ-9 Score 0  1  9   Difficult doing work/chores Not difficult at all Not difficult at all Not difficult at all     Data saved with a previous flowsheet row definition       12/27/2023   10:35 AM 07/13/2023   10:01 AM 01/31/2023    9:05 AM 08/08/2022    9:26 AM  GAD 7 : Generalized Anxiety Score  Nervous, Anxious, on Edge 0 1 0 0  Control/stop worrying 0 1 0 0  Worry too much - different things 0 1 0 0  Trouble relaxing 0 0 0 0  Restless 0 0 0 0  Easily annoyed or irritable 0 0 0 0  Afraid - awful might happen 0 0 0 0  Total GAD 7 Score 0 3 0 0  Anxiety Difficulty Not difficult at all Not  difficult at all Not difficult at all Not difficult at all    Results for orders placed or performed in visit on 02/12/24  POCT Urinalysis Dipstick  Result Value Ref Range   Color, UA amber    Clarity, UA cloudy    Glucose, UA Negative Negative   Bilirubin, UA negative    Ketones, UA negative    Spec Grav, UA 1.015 1.010 - 1.025   Blood, UA moderate (A)    pH, UA 6.5 5.0 - 8.0   Protein, UA Negative Negative   Urobilinogen, UA 0.2 0.2 or 1.0 E.U./dL   Nitrite, UA negative    Leukocytes, UA Large (3+) (A) Negative   Appearance     Odor      Last CBC Lab Results  Component Value Date   WBC 6.8 12/27/2023   HGB 16.0 (H) 12/27/2023   HCT 49.0 (H) 12/27/2023   MCV 94 12/27/2023   MCH 30.8 12/27/2023   RDW 13.1 12/27/2023   PLT 325 12/27/2023   Last metabolic panel Lab Results  Component Value Date   GLUCOSE 99 12/27/2023   NA 138 12/27/2023   K 4.4 12/27/2023   CL 99 12/27/2023   CO2 24 12/27/2023   BUN 15 12/27/2023   CREATININE 1.15 (H) 12/27/2023   EGFR 52 (L) 12/27/2023   CALCIUM  10.2 12/27/2023   PHOS 3.4 01/31/2023   PROT 7.0 12/27/2023   ALBUMIN 4.5 12/27/2023   LABGLOB 2.5 12/27/2023   AGRATIO 1.9 08/08/2022   BILITOT 0.5 12/27/2023   ALKPHOS 69 12/27/2023   AST 20 12/27/2023   ALT 21 12/27/2023   Last lipids Lab Results  Component Value Date   CHOL 196 12/27/2023   HDL 38 (L) 12/27/2023   LDLCALC 118 (H) 12/27/2023   TRIG 227 (H) 12/27/2023   CHOLHDL 5.2 (H) 12/27/2023   Last hemoglobin A1c Lab Results  Component Value Date   HGBA1C 6.1 06/13/2023    The 10-year ASCVD risk score (Arnett DK, et al., 2019) is: 9.7%    Assessment & Plan:  Dysuria Urine dipstick with pyuria and mod RBCs. No hx of stones. Afebrile, stable vitals. Possible infection vs urethritis. Will send for UA and culture. Antibiotics based on culture.  -     POCT urinalysis dipstick -     Urinalysis, Routine w reflex microscopic -  Urine Culture    No follow-ups  on file.    Harlene Saddler, MD

## 2024-02-13 ENCOUNTER — Ambulatory Visit: Payer: Self-pay | Admitting: Student

## 2024-02-13 LAB — URINALYSIS, ROUTINE W REFLEX MICROSCOPIC
Bilirubin, UA: NEGATIVE
Glucose, UA: NEGATIVE
Ketones, UA: NEGATIVE
Nitrite, UA: POSITIVE — AB
Specific Gravity, UA: 1.018 (ref 1.005–1.030)
Urobilinogen, Ur: 1 mg/dL (ref 0.2–1.0)
pH, UA: 6.5 (ref 5.0–7.5)

## 2024-02-13 LAB — MICROSCOPIC EXAMINATION
Casts: NONE SEEN /LPF
WBC, UA: >30 /HPF — AB (ref 0–5)

## 2024-02-13 LAB — SPECIMEN STATUS REPORT

## 2024-02-13 MED ORDER — CEPHALEXIN 500 MG PO CAPS: 500.0000 mg | ORAL_CAPSULE | Freq: Two times a day (BID) | ORAL | 0 refills | Status: AC

## 2024-02-16 LAB — SPECIMEN STATUS REPORT

## 2024-02-16 LAB — URINE CULTURE

## 2024-04-19 ENCOUNTER — Telehealth: Payer: Self-pay | Admitting: Student

## 2024-04-19 DIAGNOSIS — Z85828 Personal history of other malignant neoplasm of skin: Secondary | ICD-10-CM

## 2024-04-19 DIAGNOSIS — C50911 Malignant neoplasm of unspecified site of right female breast: Secondary | ICD-10-CM

## 2024-04-19 NOTE — Telephone Encounter (Signed)
 Copied from CRM [id number]. Topic: Referral - Request for Referral >> Apr 19, 2024 10:32 AM Myrick T wrote: Patient called stated she has been seeing Dr Arlyss but insurance is requiring a referral. Her next appt with Dr Arlyss is 2/24  Type of order/referral and detailed reason for visit: Dermatologist  Preference of office, provider, location: [place]  If referral order, have you been seen by this specialty before? Yes

## 2024-04-19 NOTE — Telephone Encounter (Signed)
 Please review patient's request for referral if it is appropriate

## 2024-04-22 NOTE — Addendum Note (Signed)
 Addended by: CAMACHO OCAMPO, Keely Drennan M on: 04/22/2024 02:25 PM   Modules accepted: Orders

## 2024-04-22 NOTE — Addendum Note (Signed)
 Addended by: CAMACHO OCAMPO, Jannice Beitzel M on: 04/22/2024 11:11 AM   Modules accepted: Orders

## 2024-06-21 ENCOUNTER — Ambulatory Visit: Admitting: Student

## 2024-06-27 ENCOUNTER — Ambulatory Visit: Admitting: Student

## 2024-11-27 ENCOUNTER — Ambulatory Visit

## 2024-11-28 ENCOUNTER — Ambulatory Visit
